# Patient Record
Sex: Female | Born: 1985 | Race: Black or African American | Hispanic: No | Marital: Single
Health system: Midwestern US, Community
[De-identification: ages and names within clinical notes are randomized; demographics above are authoritative.]

## PROBLEM LIST (undated history)

## (undated) DIAGNOSIS — F1011 Alcohol abuse, in remission: Secondary | ICD-10-CM

## (undated) DIAGNOSIS — N289 Disorder of kidney and ureter, unspecified: Secondary | ICD-10-CM

## (undated) DIAGNOSIS — I119 Hypertensive heart disease without heart failure: Secondary | ICD-10-CM

## (undated) DIAGNOSIS — I7100 Dissection of unspecified site of aorta: Secondary | ICD-10-CM

## (undated) DIAGNOSIS — M3119 Other thrombotic microangiopathy: Secondary | ICD-10-CM

## (undated) DIAGNOSIS — Z992 Dependence on renal dialysis: Secondary | ICD-10-CM

## (undated) DIAGNOSIS — I5032 Chronic diastolic (congestive) heart failure: Secondary | ICD-10-CM

## (undated) DIAGNOSIS — I10 Essential (primary) hypertension: Secondary | ICD-10-CM

## (undated) DIAGNOSIS — F1490 Cocaine use, unspecified, uncomplicated: Secondary | ICD-10-CM

## (undated) DIAGNOSIS — M311 Thrombotic microangiopathy: Secondary | ICD-10-CM

## (undated) DIAGNOSIS — N186 End stage renal disease: Secondary | ICD-10-CM

## (undated) DIAGNOSIS — I43 Cardiomyopathy in diseases classified elsewhere: Secondary | ICD-10-CM

## (undated) HISTORY — DX: Chronic diastolic (congestive) heart failure: I50.32

## (undated) HISTORY — PX: REPAIR OF ACUTE ASCENDING THORACIC AORTIC DISSECTION: SHX6323

## (undated) HISTORY — DX: Cardiomyopathy in diseases classified elsewhere: I43

## (undated) HISTORY — DX: Hypertensive heart disease without heart failure: I11.9

## (undated) HISTORY — DX: Cocaine use, unspecified, uncomplicated: F14.90

## (undated) HISTORY — DX: Essential (primary) hypertension: I10

---

## 2015-07-01 ENCOUNTER — Inpatient Hospital Stay: Admit: 2015-07-02 | Payer: Self-pay | Primary: Internal Medicine

## 2015-07-01 DIAGNOSIS — J189 Pneumonia, unspecified organism: Secondary | ICD-10-CM

## 2015-07-01 NOTE — Progress Notes (Signed)
1930. Pt a direct admit, arrived in unit from [facility] via transport in a Doctor, general practice. Pt alert and oriented x4, with IV line gauge #22, Left AC.     Hooked to cardiac monitor. Admission assessment and medrecon completed.

## 2015-07-01 NOTE — Progress Notes (Signed)
Called [facility] to have them send discharge summary of the pt, which was not included in the paperworks that came  With the transfer.

## 2015-07-01 NOTE — Other (Signed)
Bedside and Verbal shift change report given to Harvie Heck RN (oncoming nurse) by Alberteen Spindle (offgoing nurse). Report included the following information SBAR, Kardex, MAR, Recent Results and Cardiac Rhythm NSR, Sinus Tach.

## 2015-07-01 NOTE — Progress Notes (Signed)
[place] called back. Spoke with [employer] and said that they were not able to prepare the discharge summary. Was suggested to call in the morning again and ask the AM nurses in their unit for the paperworks.

## 2015-07-01 NOTE — Progress Notes (Signed)
1945: Informed offgoing and incoming charge nurse about pt's arrival. CNs sent message to Dr. Romeo Apple informing about pt's admission in the unit.     Awaiting for callback.

## 2015-07-01 NOTE — Progress Notes (Signed)
2040: Sent archwireless to Dr. Romeo Apple informing about admission.     2042: Dr. Earlie Lou called back. RN was instructed to let hematology/oncology aware of pt's admission.

## 2015-07-01 NOTE — Progress Notes (Signed)
2105:Spoke with Dr. Lesly Rubenstein, the hematologist/oncologist on-call for Dr.Tan and informed about the transfer of pt in the unit.  Was instructed to have hospitalist see the patient and callback for follow-up if needed.     2110: Relayed the message to Dr. Earlie Lou.     Will wait for orders and continue to monitor the pt.

## 2015-07-02 ENCOUNTER — Inpatient Hospital Stay
Admit: 2015-07-02 | Discharge: 2015-07-04 | Disposition: A | Discharge status: Home / Self Care | Payer: Self-pay | Source: Other Acute Inpatient Hospital | Attending: Internal Medicine | Admitting: Internal Medicine

## 2015-07-02 LAB — CBC WITH AUTOMATED DIFF
BASOPHILS: 0.8 % (ref 0–3)
EOSINOPHILS: 4.4 % (ref 0–5)
HCT: 24.3 % — ABNORMAL LOW (ref 37.0–50.0)
HGB: 8.4 gm/dl — ABNORMAL LOW (ref 13.0–17.2)
IMMATURE GRANULOCYTES: 3.1 % — ABNORMAL HIGH (ref 0.0–3.0)
LYMPHOCYTES: 28.6 % (ref 28–48)
MCH: 30.8 pg (ref 25.4–34.6)
MCHC: 34.6 gm/dl (ref 30.0–36.0)
MCV: 89 fL (ref 80.0–98.0)
MONOCYTES: 8.4 % (ref 1–13)
MPV: 13.4 fL — ABNORMAL HIGH (ref 6.0–10.0)
NEUTROPHILS: 54.7 % (ref 34–64)
NRBC: 0 (ref 0–0)
PLATELET: 127 10*3/uL — ABNORMAL LOW (ref 140–450)
RBC: 2.73 M/uL — ABNORMAL LOW (ref 3.60–5.20)
RDW-SD: 49.1 — ABNORMAL HIGH (ref 36.4–46.3)
WBC: 9.1 10*3/uL (ref 4.0–11.0)

## 2015-07-02 LAB — METABOLIC PANEL, COMPREHENSIVE
ALT (SGPT): 10 U/L — ABNORMAL LOW (ref 12–78)
AST (SGOT): 8 U/L — ABNORMAL LOW (ref 15–37)
Albumin: 2.5 gm/dl — ABNORMAL LOW (ref 3.4–5.0)
Alk. phosphatase: 96 U/L (ref 45–117)
BUN: 33 mg/dl — ABNORMAL HIGH (ref 7–25)
Bilirubin, total: 0.4 mg/dl (ref 0.2–1.0)
CO2: 26 mEq/L (ref 21–32)
Calcium: 8.2 mg/dl — ABNORMAL LOW (ref 8.5–10.1)
Chloride: 98 mEq/L (ref 98–107)
Creatinine: 5.3 mg/dl — ABNORMAL HIGH (ref 0.6–1.3)
GFR est AA: 12
GFR est non-AA: 10
Glucose: 122 mg/dl — ABNORMAL HIGH (ref 74–106)
Potassium: 3.3 mEq/L — ABNORMAL LOW (ref 3.5–5.1)
Protein, total: 5.6 gm/dl — ABNORMAL LOW (ref 6.4–8.2)
Sodium: 133 mEq/L — ABNORMAL LOW (ref 136–145)

## 2015-07-02 LAB — RETICULOCYTE COUNT
Absolute Retic Cnt.: 0.15 M/uL — ABNORMAL HIGH (ref 0.020–0.110)
Immature Retic Fraction: 34 % — ABNORMAL HIGH (ref 0.05–0.25)
Retic Hgb Conc.: 36.2 pg (ref 28.2–36.6)
Reticulocyte count: 5.55 % — ABNORMAL HIGH (ref 0.50–1.50)

## 2015-07-02 LAB — PROTHROMBIN TIME + INR
INR: 1 (ref 0.1–1.1)
Prothrombin time: 13.3 seconds (ref 11.5–14.0)

## 2015-07-02 LAB — INFLUENZA A/B, BY PCR
H1N1 by PCR: NOT DETECTED
Influenza A PCR: NEGATIVE
Influenza B PCR: NEGATIVE

## 2015-07-02 LAB — ANTIBODY SCREEN: Antibody screen: NEGATIVE

## 2015-07-02 LAB — LD: LD: 560 U/L — ABNORMAL HIGH (ref 84–246)

## 2015-07-02 LAB — TROPONIN I
Troponin-I: 0.152 ng/ml — ABNORMAL HIGH (ref 0.000–0.045)
Troponin-I: 0.166 ng/ml — ABNORMAL HIGH (ref 0.000–0.045)

## 2015-07-02 LAB — BLOOD TYPE, (ABO+RH)
ABO/Rh(D): O POS
ABO/Rh: O POS

## 2015-07-02 LAB — HGB & HCT
HCT: 24.1 % — ABNORMAL LOW (ref 37.0–50.0)
HGB: 8.3 gm/dl — ABNORMAL LOW (ref 13.0–17.2)

## 2015-07-02 LAB — PTT: aPTT: 34.3 seconds (ref 24.9–35.6)

## 2015-07-02 LAB — TSH 3RD GENERATION: TSH: 1.95 u[IU]/mL (ref 0.358–3.740)

## 2015-07-02 LAB — S.PNEUMO AG, UR/CSF: Strep pneumo Ag, urine: NEGATIVE

## 2015-07-02 LAB — LEGIONELLA PNEUMOPHILA AG, URINE: Legionella Ag, urine: NEGATIVE

## 2015-07-02 LAB — CALCIUM, IONIZED: CALCIUM,IONIZED: 4.5 mg/dl (ref 4.4–5.4)

## 2015-07-02 MED ORDER — DIPHENHYDRAMINE HCL 50 MG/ML IJ SOLN
50 mg/mL | Freq: Four times a day (QID) | INTRAMUSCULAR | Status: DC | PRN
Start: 2015-07-02 — End: 2015-07-04

## 2015-07-02 MED ORDER — SODIUM CHLORIDE 0.9 % IV
500 mg | INTRAVENOUS | Status: DC
Start: 2015-07-02 — End: 2015-07-04
  Administered 2015-07-02 – 2015-07-04 (×3): via INTRAVENOUS

## 2015-07-02 MED ORDER — ZOLPIDEM 5 MG TAB
5 mg | Freq: Every evening | ORAL | Status: DC | PRN
Start: 2015-07-02 — End: 2015-07-04

## 2015-07-02 MED ORDER — CALCIUM GLUCONATE 100 MG/ML (10%) IV SOLN
100 mg/mL (10%) | INTRAVENOUS | Status: DC | PRN
Start: 2015-07-02 — End: 2015-07-04

## 2015-07-02 MED ORDER — ALBUTEROL SULFATE 2.5 MG/0.5 ML NEB SOLUTION
2.5 mg/0.5 mL | Freq: Four times a day (QID) | RESPIRATORY_TRACT | Status: DC
Start: 2015-07-02 — End: 2015-07-02
  Administered 2015-07-02: 07:00:00 via RESPIRATORY_TRACT

## 2015-07-02 MED ORDER — ACETAMINOPHEN 325 MG TABLET
325 mg | Freq: Four times a day (QID) | ORAL | Status: DC | PRN
Start: 2015-07-02 — End: 2015-07-02

## 2015-07-02 MED ORDER — PANTOPRAZOLE 40 MG TAB, DELAYED RELEASE
40 mg | Freq: Every day | ORAL | Status: DC
Start: 2015-07-02 — End: 2015-07-04
  Administered 2015-07-03 – 2015-07-04 (×2): via ORAL

## 2015-07-02 MED ORDER — ALBUTEROL SULFATE 2.5 MG/0.5 ML NEB SOLUTION
2.5 mg/0.5 mL | Freq: Four times a day (QID) | RESPIRATORY_TRACT | Status: DC | PRN
Start: 2015-07-02 — End: 2015-07-04

## 2015-07-02 MED ORDER — CARVEDILOL 25 MG TAB
25 mg | Freq: Two times a day (BID) | ORAL | Status: DC
Start: 2015-07-02 — End: 2015-07-04
  Administered 2015-07-02 – 2015-07-04 (×5): via ORAL

## 2015-07-02 MED ORDER — SODIUM CHLORIDE 0.9 % IV
INTRAVENOUS | Status: DC
Start: 2015-07-02 — End: 2015-07-03
  Administered 2015-07-02 – 2015-07-03 (×4): via INTRAVENOUS

## 2015-07-02 MED ORDER — PREDNISONE 20 MG TAB
20 mg | Freq: Every day | ORAL | Status: DC
Start: 2015-07-02 — End: 2015-07-03
  Administered 2015-07-03: 13:00:00 via ORAL

## 2015-07-02 MED ORDER — NALOXONE 0.4 MG/ML INJECTION
0.4 mg/mL | INTRAMUSCULAR | Status: DC | PRN
Start: 2015-07-02 — End: 2015-07-04

## 2015-07-02 MED ORDER — SODIUM CHLORIDE 0.9 % IV PIGGY BACK
2 gram | INTRAVENOUS | Status: DC
Start: 2015-07-02 — End: 2015-07-04
  Administered 2015-07-02 – 2015-07-04 (×3): via INTRAVENOUS

## 2015-07-02 MED ORDER — SODIUM CHLORIDE 0.9 % IJ SYRG
Freq: Three times a day (TID) | INTRAMUSCULAR | Status: DC
Start: 2015-07-02 — End: 2015-07-04
  Administered 2015-07-02 – 2015-07-04 (×8): via INTRAVENOUS

## 2015-07-02 MED ORDER — AMLODIPINE 5 MG TAB
5 mg | Freq: Every day | ORAL | Status: DC
Start: 2015-07-02 — End: 2015-07-04
  Administered 2015-07-02 – 2015-07-04 (×4): via ORAL

## 2015-07-02 MED ORDER — DIPHENHYDRAMINE 25 MG CAP
25 mg | Freq: Four times a day (QID) | ORAL | Status: DC | PRN
Start: 2015-07-02 — End: 2015-07-02

## 2015-07-02 MED ORDER — SODIUM CHLORIDE 0.9 % IJ SYRG
INTRAMUSCULAR | Status: DC | PRN
Start: 2015-07-02 — End: 2015-07-04

## 2015-07-02 MED ORDER — ACETAMINOPHEN 325 MG TABLET
325 mg | Freq: Four times a day (QID) | ORAL | Status: DC | PRN
Start: 2015-07-02 — End: 2015-07-04

## 2015-07-02 MED ORDER — SODIUM CHLORIDE 0.9 % IV
INTRAVENOUS | Status: DC | PRN
Start: 2015-07-02 — End: 2015-07-04

## 2015-07-02 MED ORDER — DOCUSATE SODIUM 100 MG CAP
100 mg | Freq: Every day | ORAL | Status: DC | PRN
Start: 2015-07-02 — End: 2015-07-04

## 2015-07-02 MED FILL — CARVEDILOL 25 MG TAB: 25 mg | ORAL | Qty: 1

## 2015-07-02 MED FILL — SODIUM CHLORIDE 0.9 % IV: INTRAVENOUS | Qty: 1000

## 2015-07-02 MED FILL — AZITHROMYCIN 500 MG IV SOLUTION: 500 mg | INTRAVENOUS | Qty: 5

## 2015-07-02 MED FILL — BD POSIFLUSH NORMAL SALINE 0.9 % INJECTION SYRINGE: INTRAMUSCULAR | Qty: 20

## 2015-07-02 MED FILL — AMLODIPINE 5 MG TAB: 5 mg | ORAL | Qty: 2

## 2015-07-02 MED FILL — CALCIUM GLUCONATE 100 MG/ML (10%) IV SOLN: 100 mg/mL (10%) | INTRAVENOUS | Qty: 20

## 2015-07-02 MED FILL — BD POSIFLUSH NORMAL SALINE 0.9 % INJECTION SYRINGE: INTRAMUSCULAR | Qty: 10

## 2015-07-02 MED FILL — SODIUM CHLORIDE 0.9 % IV PIGGY BACK: INTRAVENOUS | Qty: 50

## 2015-07-02 MED FILL — SODIUM CHLORIDE 0.9 % IV: INTRAVENOUS | Qty: 250

## 2015-07-02 NOTE — Other (Signed)
Bedside and Verbal shift change report given to Melissa Belch RN (oncoming nurse) by Sharia Reeve RN (offgoing nurse). Report included the following information SBAR, Intake/Output, MAR, Accordion and Cardiac Rhythm NSR with 4 beats of Non sustain Vtach.

## 2015-07-02 NOTE — Other (Signed)
Please update mom of patient per patient request, she would like to be present before any type of procedure is scheduled and have it explained to them both. [phone], Carolyne Littles.

## 2015-07-02 NOTE — Consults (Signed)
[facility]  CONSULTATION REPORT  NAME:  [name], [name]  SEX:   F  ADMIT: 07/01/2015  DATE OF CONSULT: 07/02/2015  REFERRING PHYSICIAN:    DOB: 1986-04-04  MR#    [id number]  ROOM:  CD10  ACCT#  [id number]    cc: Arby Barrette MD    REFERRING PHYSICIAN:  Dr. Romeo Apple.    PROBLEM LIST:  1.  Likely chronic kidney disease presumably due to chronic uncontrolled   hypertension and cocaine abuse.  2.  Anemia.  3.  Thrombocytopenia.  4.  Hypertension since the birth of her second child 6 years ago.  She has not   been on medications consistently over that time and was out of them for 2   weeks before admission.  5.  Active cocaine abuse.  She used heavily the week leading up to this   admission.  6.  Congestive heart failure with an ejection fraction approximately 48% with   stage II systolic dysfunction on echocardiogram done in [facility]   this week.    HISTORY OF PRESENT ILLNESS:  The patient is a very nice 30 year old female with the above-mentioned medical   problems who presented initially to [facility] with a complaint of   cough.  The patient states that she got up on the evening of admission had   uncontrollable coughing fit and actually expectorated a little bit of blood   into the toilet.  This did not last long, but it was so troubling to her and   her roommate that she presented to [facility].  Once there, she was   found to be hyponatremic, hypokalemic and have a creatinine of 5.    Consideration was for HUS-TTP and the patient was transferred up here to begin   plasmapheresis.  She currently states she feels fine.  Recently, she has had   no headaches.  She had no chest pain, but she was short of breath with cough.    No palpitations.  No nausea, vomiting, diarrhea, constipation, melena or   hematochezia.  No dysuria or hematuria.  Of note, as mentioned, she was using   cocaine heavily the week before she presented and has been out of her blood    pressure medicines for approximately 2 weeks.  She has no other complaints at   this time.    PAST MEDICAL AND SURGICAL HISTORY:  As mentioned.    ALLERGIES:  NO KNOWN DRUG ALLERGIES.    SOCIAL HISTORY:  She smokes about 1/3 of pack a day, drinks 3 to 4 beers about 3 times a week.    Cocaine use is as outlined.  She denies any other drug use.  She is currently   unemployed.  She is gravida 2, para 2-0-0-2.  There is no family history of   chronic kidney disease.    CURRENT MEDICATIONS:  Norvasc 10 mg daily, Zithromax 500 mg IV daily, Coreg 25 mg b.i.d., Rocephin 2   grams IV daily.    REVIEW OF SYSTEMS:  As per HPI, otherwise noncontributory.    PHYSICAL EXAMINATION:  VITAL SIGNS:  She is afebrile, pulse 89, blood pressure 120/68.  GENERAL:  No acute distress.  NECK:  Supple, no lymphadenopathy, 2+ carotid pulses bilaterally.  Trachea in   the midline.  CHEST:  Normal symmetric air movement.  No rales, rhonchi or wheezes   bilaterally.  HEART:  Normal S1 and S2, regular rate and rhythm.  There is an S4, which  clearly audible with an occasional S3.  No murmurs or rubs.  ABDOMEN:  Obese, soft, nontender, nondistended, normoactive bowel sounds, no   masses.  EXTREMITIES:  No clubbing, cyanosis or edema.    LABORATORY DATA:  White count 9, hemoglobin and hematocrit 8 and 24, platelets of 127.  LDH was   560.  ADAMTS13 is pending.  Sodium 133, potassium 3.3, chloride 98,   bicarbonate 26, BUN 33, creatinine 5.3, glucose 122, albumin 2.5.  LFTs   unremarkable.  Troponins are negative.  Renal ultrasound was done at [facility]   as well.  It showed normal size and symmetry; however, increased echotexture   consistent with chronic kidney disease.    ASSESSMENT:  1.  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  In reviewing Care   Everywhere, the patient presented to [facility] in [place]   with similar labs.  She did have a creatinine of 1 back in 2015; however, she    was off of blood pressure medicines for most of that year and using cocaine as   outlined.  Unfortunately, I think she has near ESRD from chronic cocaine use   and hypertension.  Certainly, there is some consideration for HUS-TTP and I   think it is appropriate to treat her until this is ruled out, but I think when   all is said and done, this is going to be most consistent with CKD.  I do not   have any urinary data to guide this; however, and this would be good to find   out.  Serologic workup was supposed to be ordered at [facility] however, this was   not done.  There is obviously no evidence of obstruction.  2.  Anemia.  As above, the patient is due to start plasma exchange per Dr.   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  3.  Thrombocytopenia.  Again, she presented with a platelet count in the 40s   at [place] previously, did not get pheresis and this improved spontaneously   again suggesting uncontrolled hypertension as the nidus of this.  The only   difference between here and there was that she has schistocytes on smear here   which she did not have there.  4.  Hyponatremia, probably due to ADH release in the setting of uncontrolled   hypertension or possibly p.o. intake given her hypokalemia as well.  5.  Hyperkalemia.  This is getting better.  Again, I think she was hypovolemic   on presentation, probably because she was not taking in much due to cocaine   intoxication.    PLAN:  1.  Plasmapheresis will begin per Dr. Sharma Covert recommendations.  2.  Will workup her anemia briefly with iron stores, B12 and folate as this   was not done at [facility].  3.  I will pursue a brief serologic workup for lupus, hepatitis, HIV and   syphilis.  4.  Will collect urinary data.  5.  I have ordered a PTH and vitamin D levels to help confirm that this is    somewhat chronic.  6.  Unfortunately, her renal prognosis is not good.  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    Thank you for allowing [facility] to participate in the   patient's care.  We will follow her closely and make recommendations as they   become necessary.      ___________________  Herma Carson MD  Dictated By:.   SB  D:07/02/2015 12:04:02  T: 07/02/2015 12:53:03  [id number]

## 2015-07-02 NOTE — Consults (Signed)
Renal Consult -- [facility]    See dictation # [id number].    Briefly, this is a 30 y.o. female who presented with AKI.      Consult is for AKI      Brief Assessment:  1 -- AKI.  In looking at Care Everywhere, the patient was admitted to [facility] in [place], [place] with essentially the same presentation.  She had a creatinine in the 5's, anemia and thrombocytopenia as well.  I think that this is actually relatively stable CKD likely due from HTN and cocaine.  2 -- Anemia.  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  HUS/TTP certainly a consideration that is hard to rule out in this case and we do not want to miss the diagnosis.  3 -- Thrombocytopenia.  As above, could be due to HUS/TTP, cocaine, HTN or all three.  4 -- HTN.  Stable here.  She had been out of her medications for two weeks.  5 -- Cocaine abuse.  The patient admitted to me that she was using this heavily the week before she presented.    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 Jerrye Bushy.  2 -- Check PTH, vitamin D -- suspect these will be elevated.  3 -- Check iron stores, B12, folate, etc.  These were not checked at [facility].  4 -- Check serologic workup.  5 -- Renal prognosis at this point is not that good.  She is not far off from needing dialysis.    Thanks!!!    Bobbe Medico, MD, [facility]  [phone] Office  [phone] Pager

## 2015-07-02 NOTE — Progress Notes (Addendum)
Patient admitted on 07/01/2015 from Home with No chief complaint on file.       The patient has been admitted to the hospital 1 times in the past 12 months.    Tentative dc plan:   Home follow up with [facility]-patient has no pcp and self pay. Email sent to [facility]-self pay. Patient lives with mother, independent no needs for home health. CM to continue to monitor.  sw consult entered.     Facility if plan no    Anticipated Discharge Date:   02/17    PCP: Drake Leach      Specialists:     none    Dialysis Unit/ chair time / access:    no    Ph[facility]acy:     [facility]     DME:    none    Home Environment:    Lives at [address]  [address]  [place] [address]    [phone].     Prior to admission open services:     no    Home Health Agency-     no    Personal Care Agency-    no    Extended Emergency Contact Information  Primary Emergency Contact: Butcher,Mortic  Address: [address]           [place], [place] [address] [place]  Mobile Phone: [phone]  Relation: None      Transportation:     Mother will transport home    Therapy Recommendations:  OT :y/n     no  PT :y/n     Pending notes  SLP :y/n    no    RT Home O2 Evaluation :y/n     No current oxygen sat on room air 100%    Wound Care: y/n     no    Change consult (formerly [facility]) :   yes    Case Management Assessment    ABUSE/NEGLECT SCREENING   Physical Abuse/Neglect: Denies   Sexual Abuse: Denies   Sexual Abuse: Denies   Other Abuse/Issues: Denies          PRIMARY DECISION MAKER   Primary Decision Maker Name: Ulyess Blossom   Primary Decision Maker Phone Number: [phone]   Primary Decision Maker Address: [address] [place], [place]   Primary Decision Maker Relationship to Patient: Guardian                   CARE MANAGEMENT INTERVENTIONS   Readmission Interview Completed: Not Applicable   PCP Verified by CM: Yes           Mode of Transport at Discharge: Self        Transition of Care Consult (CM Consult): Discharge Planning                   Physical Therapy Consult: Yes               Reason for Referral: DCP Rounds   History Provided By: Patient   Patient Orientation: Alert and Oriented   Cognition: Alert   Support System Response: Unavailable   Previous Living Arrangement: Lives with Family Independent       Prior Functional Level: Independent in ADLs/IADLs   Current Functional Level: Independent in ADLs/IADLs   Primary Language: English   Can patient return to prior living arrangement: Yes   Ability to make needs known:: Good   Family able to assist with  home care needs:: Yes               Types of Needs Identified: Disease Management Education, Treatment Education, Emotional Support       Confirm Follow Up Transport: Family       Plan discussed with Pt/Family/Caregiver: Yes   Freedom of Choice Offered: Yes      DISCHARGE LOCATION   Discharge Placement: Home

## 2015-07-02 NOTE — Progress Notes (Signed)
Hospitalist Progress Note         [facility]    Daily Progress Note: 07/02/2015    Assessment/Plan:  1. CA Pneumonia  2. HTN  3. AKI/CKD  4. Anemia  5. Thrombocytopenia  6. Ongoing Tob Use  7. Cocaine Abuse  D/W Drs Eugenie Norrie & Lyn Hollingshead- similar presentation Dec 2016 @ [facility]. Holding Plasma Exchange. Will cont to monitor closely       Subjective:   Feels better. No further hemoptysis. No SOB. No C/P.      Respiratory ROS: no cough, shortness of breath,pleutic chest pain or wheezing  Cardiovascular ROS: no chest pain , dyspnea on exertion, orthopnea  Gastrointestinal ROS: no abdominal pain, nausea,vomiting, diarrhea, melena or hematechezia  Genito-Urinary ROS: no dysuria, trouble voiding, or hematuria  Objective:   Physical Exam:     Visit Vitals   ??? BP 129/87   ??? Pulse 87   ??? Temp 98.6 ??F (37 ??C)   ??? Resp 17   ??? Wt 65.4 kg (144 lb 2.9 oz)   ??? SpO2 100%      O2 Device: Room air    Temp (24hrs), Avg:98.3 ??F (36.8 ??C), Min:97.9 ??F (36.6 ??C), Max:98.9 ??F (37.2 ??C)        02/13 1901 - 02/15 0700  In: 1413.3 [P.O.:580; I.V.:833.3]  Out: -     General:  Alert, cooperative, no distress, appears stated age.   Lungs:   Clear to auscultation bilaterally.    :     Heart:  Regular rate and rhythm, S1, S2 normal, no murmur, click, rub or gallop.   Abdomen:   Soft, non-tender. Bowel sounds normal. No masses,  No organomegaly.   Extremities:  FROM, no LE edema   Pulses: 2+ and symmetric all extremities.   Skin: Skin color, texture, turgor normal. No rashes or lesions   :      Data Review:       24 Hour Results:  Recent Results (from the past 24 hour(s))   INFLUENZA A/B, H1N1 BY PCR    Collection Time: 07/02/15 12:20 AM   Result Value Ref Range    Influenza A PCR NEGATIVE NEGATIVE      H1N1 by PCR NOT DETECTED NOT DETECTED,NOT INDICATED      Influenza B PCR NEGATIVE NEGATIVE     HGB & HCT    Collection Time: 07/02/15 12:54 AM   Result Value Ref Range    HGB 8.3 (L) 13.0 - 17.2 gm/dl     HCT 24.1 (L) 37.0 - 50.0 %   TYPE, ABO & RH    Collection Time: 07/02/15 12:54 AM   Result Value Ref Range    ABO/Rh(D) O Rh Positive     ANTIBODY SCREEN    Collection Time: 07/02/15 12:54 AM   Result Value Ref Range    Antibody screen NEG     METABOLIC PANEL, COMPREHENSIVE    Collection Time: 07/02/15 12:54 AM   Result Value Ref Range    Sodium 133 (L) 136 - 145 mEq/L    Potassium 3.3 (L) 3.5 - 5.1 mEq/L    Chloride 98 98 - 107 mEq/L    CO2 26 21 - 32 mEq/L    Glucose 122 (H) 74 - 106 mg/dl    BUN 33 (H) 7 - 25 mg/dl    Creatinine 5.3 (H) 0.6 - 1.3 mg/dl    GFR est AA 12.0      GFR est non-AA 10  Calcium 8.2 (L) 8.5 - 10.1 mg/dl    AST (SGOT) 8 (L) 15 - 37 U/L    ALT (SGPT) 10 (L) 12 - 78 U/L    Alk. phosphatase 96 45 - 117 U/L    Bilirubin, total 0.4 0.2 - 1.0 mg/dl    Protein, total 5.6 (L) 6.4 - 8.2 gm/dl    Albumin 2.5 (L) 3.4 - 5.0 gm/dl   TSH 3RD GENERATION    Collection Time: 07/02/15 12:54 AM   Result Value Ref Range    TSH 1.950 0.358 - 3.740 uIU/mL   TROPONIN I    Collection Time: 07/02/15 12:54 AM   Result Value Ref Range    Troponin-I 0.152 (H) 0.000 - 0.045 ng/ml   S.PNEUMO AG, URINE/CSF ONLY    Collection Time: 07/02/15  2:00 AM   Result Value Ref Range    Strep pneumo Ag, urine NEGATIVE NEGATIVE     LEGIONELLA PNEUMOPHILA AG, URINE     Collection Time: 07/02/15  2:00 AM   Result Value Ref Range    Legionella Ag, Urine NEGATIVE NEGATIVE     LD    Collection Time: 07/02/15  6:18 AM   Result Value Ref Range    LD 560 (H) 84 - 246 U/L   RETICULOCYTE COUNT    Collection Time: 07/02/15  6:18 AM   Result Value Ref Range    Reticulocyte count 5.55 (H) 0.50 - 1.50 %    Absolute Retic Cnt. 0.150 (H) 0.020 - 0.110 M/uL    Immature Retic Fraction 34.00 (H) 0.05 - 0.25 %    Retic Hgb Conc. 36.2 28.2 - 36.6 pg   CBC WITH AUTOMATED DIFF    Collection Time: 07/02/15  6:21 AM   Result Value Ref Range    WBC 9.1 4.0 - 11.0 1000/mm3    RBC 2.73 (L) 3.60 - 5.20 M/uL    HGB 8.4 (L) 13.0 - 17.2 gm/dl     HCT 24.3 (L) 37.0 - 50.0 %    MCV 89.0 80.0 - 98.0 fL    MCH 30.8 25.4 - 34.6 pg    MCHC 34.6 30.0 - 36.0 gm/dl    PLATELET 127 (L) 140 - 450 1000/mm3    MPV 13.4 (H) 6.0 - 10.0 fL    RDW-SD 49.1 (H) 36.4 - 46.3      NRBC 0 0 - 0      IMMATURE GRANULOCYTES 3.1 (H) 0.0 - 3.0 %    NEUTROPHILS 54.7 34 - 64 %    LYMPHOCYTES 28.6 28 - 48 %    MONOCYTES 8.4 1 - 13 %    EOSINOPHILS 4.4 0 - 5 %    BASOPHILS 0.8 0 - 3 %   TROPONIN I    Collection Time: 07/02/15  6:21 AM   Result Value Ref Range    Troponin-I 0.166 (H) 0.000 - 0.045 ng/ml   CALCIUM, IONIZED    Collection Time: 07/02/15 11:49 AM   Result Value Ref Range    CALCIUM,IONIZED 4.5 4.4 - 5.4 mg/dl   PROTHROMBIN TIME + INR    Collection Time: 07/02/15 11:49 AM   Result Value Ref Range    Prothrombin time 13.3 11.5 - 14.0 seconds    INR 1.0 0.1 - 1.1     PTT    Collection Time: 07/02/15 11:49 AM   Result Value Ref Range    aPTT 34.3 24.9 - 35.6 seconds       Problem List:  Problem  List as of 07/02/2015  Never Reviewed          Codes Class Noted - Resolved    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 albuterol CONCENTRATE 2.77m/0.5 mL neb soln  2.5 mg Nebulization Q6H PRN   ??? [START ON 07/03/2015] predniSONE (DELTASONE) tablet 60 mg  60 mg Oral DAILY WITH BREAKFAST   ??? [START ON 07/03/2015] pantoprazole (PROTONIX) tablet 40 mg  40 mg Oral ACB   ??? 0.9% sodium chloride infusion 250 mL  250 mL IntraVENous PRN   ??? acetaminophen (TYLENOL) tablet 650 mg  650 mg Oral Q6H PRN   ??? calcium gluconate 2 g in 0.9% sodium chloride 100 mL IVPB  2 g IntraVENous PRN   ??? diphenhydrAMINE (BENADRYL) injection 50 mg  50 mg IntraVENous Q6H PRN   ??? amLODIPine (NORVASC) tablet 10 mg  10 mg Oral DAILY   ??? carvedilol (COREG) tablet 25 mg  25 mg Oral BID WITH MEALS    ??? sodium chloride (NS) flush 5-10 mL  5-10 mL IntraVENous Q8H   ??? sodium chloride (NS) flush 5-10 mL  5-10 mL IntraVENous PRN   ??? naloxone (NARCAN) injection 0.1 mg  0.1 mg IntraVENous PRN   ??? docusate sodium (COLACE) capsule 100 mg  100 mg Oral DAILY PRN   ??? zolpidem (AMBIEN) tablet 5 mg  5 mg Oral QHS PRN   ??? cefTRIAXone (ROCEPHIN) 2 g in 0.9% sodium chloride (MBP/ADV) 50 mL MBP  2 g IntraVENous Q24H   ??? azithromycin (ZITHROMAX) 500 mg in 0.9% sodium chloride 250 mL IVPB  500 mg IntraVENous Q24H   ??? 0.9% sodium chloride infusion  125 mL/hr IntraVENous CONTINUOUS        Care Plan discussed with: pt, Drs ADerrick Ravel& Rakowski    Total time spent with patient: 20 minutes.    BCarlean Jews MD  July 02, 2015  1:53 PM

## 2015-07-02 NOTE — Progress Notes (Addendum)
Phone: [phone]     Hematology / Oncology Progress Note  [facility]      Patient: [name] [name]   MRN: [id number]         CSN: [id number]    Date of Birth: Sep 02, 1985      Admit Date: 07/01/2015    Assessment:     1- ? TMA (TTP/HUS)- RF, anemia, thrombocytopenia, elevated LDH, smear showed 3-5 schistocytes phf  2- Acute on Chronic kidney injury- pt first heard of Abn Cr in jan 2017 in [place]  3- Anemia- hemolysis   4- Thrombocytopenia  5- Cocaine/alcohol use  6- HTN since age 3    Plan:   - pt will have pheresis catheter put in today for plasma exchange by vascular  - consult nephrology  - daily hemolysis parameters   - start prednisone 50m/kg PO daily as per fully alert , also protonix 40 mg daily  - [facility]   - [id number]  [facility]   - Addendum: [facility] and it seems like she had the same picture and nephrology there 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 Her blood smear today looks better (rare schistocytes) , LDH, and PLT better. SO discussed with Dr. RLyn Hollingsheadand Dr OCranston Neighborand I decided to hold plasma exchange. Vascular and [facility] were informed of the decision.     CLaurita Quint MD  [phone]     Subjective:   Pt feels tired , mild cough , no new complains .     Objective:     Visit Vitals   ??? BP (!) 137/92   ??? Pulse 95   ??? Temp 98.9 ??F (37.2 ??C)   ??? Resp 25   ??? Wt 65.4 kg (144 lb 2.9 oz)   ??? SpO2 100%             Temp (24hrs), Avg:98.2 ??F (36.8 ??C), Min:97.9 ??F (36.6 ??C), Max:98.9 ??F (37.2 ??C)        Intake/Output Summary (Last 24 hours) at 07/02/15 0931  Last data filed at 07/02/15 0459   Gross per 24 hour   Intake          1413.33 ml   Output                0 ml   Net          1413.33 ml       Review of Systems   Constitutional:   Fever: Negative, Chills: Negative, Weight Loss: Negative, Malaise/Fatigue: +   Diaphoresis: Negative  Skin:   Rash: Negative,  Itching: Negative  HENT:   Headache:Negative, Hearing Loss: Negative, Tinnitus: Negative, Ear Pain: Negative   Nosebleeds: Negative, Stridor: Negative, Sore Throat: Negative  Eyes:   Jaudice: negative Blurred Vision: Negative, Double Vision: Negative, Photophobia: Negative   Eye Discharge: Negative, Eye Redness: Negative  Cardiovascular:    Chest Pain: Negative, Palpitations: Negative, Orthopnea: Negative    Leg Swelling: Negative PND: Negative  Respiratory:   Cough: +, Hemoptysis: Negative, Sputum Production: Negative   Shortness of Breath: Negative, Wheezing: Negative  Gastrointestinal:   Heartburn: Negative, Nausea: Negative, Vomiting: Negative   Abdominal Pain: Negative, Diarrhea: Negative, Constipation: Negative   Blood in Stool:  Negative, Melena: Negative   Genitourinary:    Dysuria: Negative, Urgency: Negative, Frequency: Negative   Hematuria: Negative, Flank Pain: Negative  Musculoskeletal:    Myalgias: Negative, Neck Pain: Negative, Back Pain: Negative   Joint Pain: Negative, Falls: Negative  Endo/Heme:    Easy Bruise/Blood: Negative, Polydipsia: Negative, sweats: negative  Neurological:    Dizziness: Negative, Tingling: Negative. Tremor: Negative,    Sensory Change: Negative. Speech Change: Negative, Focal Weakness: Negative     Seizures: Negative, LOC: Negative  Psychiatric:   Depression: Negative, Suicidal Ideas: Negative,  Hallucinations: Negative, Anxious: Negative, Insomnia: Negative, Memory Loss: Negative     Physical Exam:    Constitutional: well developed, well nourished, no acute distress and alert and oriented x 3   HENT: Oral mucosa  moist, no cyanosis observed. +  pallor observed.   Eyes: conjunctiva normal, upper and lower eyelid normal bilaterally.   Neck: No jugular venous distension present.    Cardiovascular: heart sounds normal, normal rate and regular rhythm, no murmurs or rubs,  Carotid arteries normal.     Pulmonary/Chest Wall: breath sounds are clear bilaterally and no use of accessory muscles in breathing.   Abdominal: Non tender, no palpable masses, no hepatosplenomegaly    Genitourinary/Anorectal: No abnormality   Musculoskeletal: No digital clubbing or cyanosis. Normal muscle strength and tone.   Skin: Normal and no rashes or lesions   Lymph nodes: Not enlarged   Peripheral Vascular: No edema present in extremities.         Labs:  Recent Results (from the past 24 hour(s))   INFLUENZA A/B, H1N1 BY PCR    Collection Time: 07/02/15 12:20 AM   Result Value Ref Range    Influenza A PCR NEGATIVE NEGATIVE      H1N1 by PCR NOT DETECTED NOT DETECTED,NOT INDICATED      Influenza B PCR NEGATIVE NEGATIVE     HGB & HCT    Collection Time: 07/02/15 12:54 AM   Result Value Ref Range    HGB 8.3 (L) 13.0 - 17.2 gm/dl    HCT 24.1 (L) 37.0 - 50.0 %   TYPE, ABO & RH    Collection Time: 07/02/15 12:54 AM   Result Value Ref Range    ABO/Rh(D) O Rh Positive     ANTIBODY SCREEN    Collection Time: 07/02/15 12:54 AM   Result Value Ref Range    Antibody screen NEG     METABOLIC PANEL, COMPREHENSIVE    Collection Time: 07/02/15 12:54 AM   Result Value Ref Range    Sodium 133 (L) 136 - 145 mEq/L    Potassium 3.3 (L) 3.5 - 5.1 mEq/L    Chloride 98 98 - 107 mEq/L    CO2 26 21 - 32 mEq/L    Glucose 122 (H) 74 - 106 mg/dl    BUN 33 (H) 7 - 25 mg/dl    Creatinine 5.3 (H) 0.6 - 1.3 mg/dl    GFR est AA 12.0      GFR est non-AA 10      Calcium 8.2 (L) 8.5 - 10.1 mg/dl    AST (SGOT) 8 (L) 15 - 37 U/L    ALT (SGPT) 10 (L) 12 - 78 U/L    Alk. phosphatase 96 45 - 117 U/L    Bilirubin, total 0.4 0.2 - 1.0 mg/dl    Protein, total 5.6 (L) 6.4 - 8.2 gm/dl    Albumin 2.5 (L) 3.4 - 5.0 gm/dl   TSH 3RD GENERATION  Collection Time: 07/02/15 12:54 AM   Result Value Ref Range    TSH 1.950 0.358 - 3.740 uIU/mL   TROPONIN I     Collection Time: 07/02/15 12:54 AM   Result Value Ref Range    Troponin-I 0.152 (H) 0.000 - 0.045 ng/ml   S.PNEUMO AG, URINE/CSF ONLY    Collection Time: 07/02/15  2:00 AM   Result Value Ref Range    Strep pneumo Ag, urine NEGATIVE NEGATIVE     LEGIONELLA PNEUMOPHILA AG, URINE     Collection Time: 07/02/15  2:00 AM   Result Value Ref Range    Legionella Ag, Urine NEGATIVE NEGATIVE     LD    Collection Time: 07/02/15  6:18 AM   Result Value Ref Range    LD 560 (H) 84 - 246 U/L   RETICULOCYTE COUNT    Collection Time: 07/02/15  6:18 AM   Result Value Ref Range    Reticulocyte count 5.55 (H) 0.50 - 1.50 %    Absolute Retic Cnt. 0.150 (H) 0.020 - 0.110 M/uL    Immature Retic Fraction 34.00 (H) 0.05 - 0.25 %    Retic Hgb Conc. 36.2 28.2 - 36.6 pg   CBC WITH AUTOMATED DIFF    Collection Time: 07/02/15  6:21 AM   Result Value Ref Range    WBC 9.1 4.0 - 11.0 1000/mm3    RBC 2.73 (L) 3.60 - 5.20 M/uL    HGB 8.4 (L) 13.0 - 17.2 gm/dl    HCT 24.3 (L) 37.0 - 50.0 %    MCV 89.0 80.0 - 98.0 fL    MCH 30.8 25.4 - 34.6 pg    MCHC 34.6 30.0 - 36.0 gm/dl    PLATELET 127 (L) 140 - 450 1000/mm3    MPV 13.4 (H) 6.0 - 10.0 fL    RDW-SD 49.1 (H) 36.4 - 46.3      NRBC 0 0 - 0      IMMATURE GRANULOCYTES 3.1 (H) 0.0 - 3.0 %    NEUTROPHILS 54.7 34 - 64 %    LYMPHOCYTES 28.6 28 - 48 %    MONOCYTES 8.4 1 - 13 %    EOSINOPHILS 4.4 0 - 5 %    BASOPHILS 0.8 0 - 3 %   TROPONIN I    Collection Time: 07/02/15  6:21 AM   Result Value Ref Range    Troponin-I 0.166 (H) 0.000 - 0.045 ng/ml

## 2015-07-02 NOTE — H&P (Signed)
[facility]  History and Physical  NAME:  [name] [name], [name] [name]  SEX:   F  ADMIT: 07/01/2015  DOB:1985/06/03  MR#    [id number]  ROOM:  CD10  ACCT#  [id number]    I hereby certify this patient for admission based upon medical necessity as   noted below:    <    DATE OF SERVICE:  07/01/2015.    CHIEF COMPLAINT:  The patient was transferred from [facility] for further management.    HISTORY OF PRESENT ILLNESS:  This is a 30 year old female with past medical history significant for   hypertension who initially presented to [facility] on 06/30/2015 with   a chief complaint of shortness of breath, cough, headache, blurry vision,   hemoptysis.  The patient underwent a chest x-ray which showed bilateral patchy   infiltrates and was diagnosed with a community-acquired pneumonia and started   on ceftriaxone, azithromycin.  An echocardiogram was also checked at that   time which showed 45% to 50% ejection fraction and a ground-glass appearance   of myocardium suggestive of amyloidosis.  Also, grade 2 diastolic dysfunction,   mild global hypokinesis of left ventricle on echocardiogram 07/01/2015.  She   has also been diagnosed with renal insufficiency about 2 months ago.    According to outside hospital's notes, she has acute-on-chronic kidney   disease, creatinine on presentation was 4.6.  Her labs here have been ordered.    The patient also noted to have thrombocytopenia has had thrombocytopenia in   the past, and has also been sick with most likely viral infection over the   past 1 week, resulting in a runny nose, sneezing, cough which has been   productive and with blood-tinged sputum.  She has not noted any blood in her   sputum since passed night.      Hemoglobin and hematocrit being trended.  Type and screen performed,    Hematology/Oncology was also consulted and the patient transferred to   [facility] for further evaluation for possibility of    TTP and possible plasmapheresis.  Hematology/Oncology informed of the   patient's arrival.    REVIEW OF SYSTEMS:  A 12-point review of systems was negative except as noted in HPI.    PAST MEDICAL HISTORY:  Hypertension.    PAST SURGICAL HISTORY:  Reviewed and noncontributory to the case.    ALLERGIES:  NO KNOWN DRUG ALLERGIES.    HOME MEDICATIONS:  Norvasc 10 mg daily, metoprolol 25 mg twice daily.    FAMILY HISTORY:  Reviewed and noncontributory to the case.    SOCIAL HISTORY:  The patient continues to use tobacco products.  She also uses cocaine, last   use was 1 week ago.  Occasional alcohol use.    PHYSICAL EXAMINATION:  VITAL SIGNS:  Blood pressure 132/90, pulse 86, oxygen saturation 100% on room   air, respiratory rate 16, temperature 98.2.  GENERAL APPEARANCE:  In no acute distress, resting comfortably, appears stated   age.  HEENT:  Normocephalic, atraumatic.  Extraocular muscles intact.  Pupils equal,   round to light and accommodation.  Severely dry mucous membranes.  NECK:  Supple, no JVD, no lymphadenopathy, no thyromegaly.  LUNGS:  Mild scattered rhonchi.  No rales, no wheezing.  Good air entry   throughout.  CARDIOVASCULAR:  S1 and S2 within normal limits.  No murmurs, rubs or gallops.  ABDOMEN:  Positive bowel sounds, soft, nontender, nondistended.  No   hepatosplenomegaly.  No guarding  or rebound tenderness.  EXTREMITIES:  No clubbing, cyanosis or edema.  Dorsalis pedis pulses 2+   bilaterally.  SKIN:  No rashes, no wounds.  NEUROLOGIC:  Cranial nerves II-XII intact.  Alert and oriented times 3,   nonfocal.    EKG showed sinus tachycardia at 121 beats minute, no ST segment elevation, no   T-wave inversion, QTc at 508 milliseconds.  MRI of the head showed increased   T2 signal.  Official read in the chart, performed at [facility].  V/Q   scan was low probability for pulmonary emboli.  Abdominal ultrasound showed   echogenic kidneys, otherwise normal study, no hydronephrosis.  Echocardiogram    as per HPI.    ASSESSMENT AND PLAN:  1.  Hemoptysis, INR normal, low platelet count; however, no further   blood-tinged sputum since last night, will trend hemoglobin and hematocrit,   type and screen.  Hematology/oncology consult initiated given possibility of   thrombotic thrombocytopenic purpura.  2.  Shortness of breath, most likely due to community-acquired pneumonia,   possibly viral induced.  Will check influenza PCR.  Chest x-ray showed   bilateral patchy infiltrates, no recent hospital admissions, no recent   antibiotic use, started on ceftriaxone and azithromycin, which have been   continued here.  Her ejection fraction is 45% to 50% and showed gross ground   glass appearance of the myocardium suggestive of amyloidosis.  Grade 2   diastolic dysfunction and mild global hypokinesis of the left ventricle on   echocardiogram done on 07/01/2015.  Consider cardiology followup on an   outpatient basis; however, at this time, the patient appears dehydrated.  We   will initiate IV fluids, saturating 100% on room air.  3.  Acute-on-chronic kidney disease per [facility] records.  The   patient's creatinine on presentation was 4.6.  No baseline creatinine   available, abdominal ultrasound shows echogenic kidneys, no hydronephrosis,   nephrology consultation.  The patient is severely dehydrated at this time.  We   will continue hydration, avoid nephrotoxic medications, adjust medications to   creatinine clearance.  4.  Thrombocytopenia.  Initial workup obtained on 07/01/2015 at [facility].  Follow results.  Some of the studies were sent out labs initiated   by hematology/oncology in the form of patient's arrival to [facility].  No active bleeding.  Repeat platelet count, most likely   viral induced, she has had thrombocytopenia in the past as well.  5.  Hypertension.  This is controlled.  Continue metoprolol 25 mg b.i.d.,   Norvasc 10 mg daily.   6.  Hyponatremia, initial sodium of 123 on 06/30/2015.  We will repeat sodium   level.  The patient is dehydrated.  Continue IV fluids.  7.  Elevated D-dimer with negative V/Q scan, chest pain resolved, bilateral   lower extremity duplex negative as well  8.  Chest pain initially on presentation to [facility], at that time   had mildly elevated cardiac enzymes, will continue to trend cardiac enzymes,   telemonitoring, chest pain free now.  EKG:  No acute ischemic changes,   associated coughing.  9.  Hypokalemia.  Replacement initiated at [facility].  Repeat   potassium level.  10.  Headache and blurry vision, resolved.  MRI of medulla showed increased T2   signal.  Neurology consultation/followup.  11.  Ongoing tobacco use.  Advised to quit.  Ongoing alcohol use.  Advised to   quit.  12.  Illicit drug use, cocaine, last use was 1 week ago.  Advised to quit.  13.  Diet is cardiac-renal.  14.  Deep venous thrombosis prophylaxis with sequential compression devices,   otherwise ambulatory.    DISPOSITION:  Home in 2 to 4 days depending on response to treatment.    CODE STATUS:  FULL CODE.    Case discussed with Dr. Romeo Apple, hematology/oncology informed, patient,   floor nursing staff.    CRITICAL CARE TIME:    48 minutes.      ___________________  Irving Burton MD  Dictated By: .   [facility]  D:07/01/2015 23:50:12  T: 07/02/2015 01:28:57  [id number]

## 2015-07-02 NOTE — Progress Notes (Signed)
Problem: Falls - Risk of  Goal: *Absence of falls  Outcome: Progressing Towards Goal  Fall precautions instituted. Instructed to call for assistance. Bed alarm on. Pt free from injury through the shift. Will continue to monitor.

## 2015-07-02 NOTE — Other (Signed)
Dr. Claudie Revering paged to notify of 4 beats of non sustain vtach at 0710. Patient is NSR currently.

## 2015-07-02 NOTE — Other (Signed)
Text paged Romeo Apple to clair fy [facility] order 1 UNIT vs. 10 UNITS that the lab had in their orders, for possible plasmapheresis.

## 2015-07-03 LAB — PTH INTACT: PTH, Intact: 340.3 pg/ml — ABNORMAL HIGH (ref 14.0–72.0)

## 2015-07-03 LAB — RETICULOCYTE COUNT
Absolute Retic Cnt.: 0.159 M/uL — ABNORMAL HIGH (ref 0.020–0.110)
Immature Retic Fraction: 33 % — ABNORMAL HIGH (ref 0.05–0.25)
Retic Hgb Conc.: 35.8 pg (ref 28.2–36.6)
Reticulocyte count: 6.12 % — ABNORMAL HIGH (ref 0.50–1.50)

## 2015-07-03 LAB — RENAL FUNCTION PANEL
Albumin: 2.5 gm/dl — ABNORMAL LOW (ref 3.4–5.0)
BUN: 27 mg/dl — ABNORMAL HIGH (ref 7–25)
CO2: 22 mEq/L (ref 21–32)
Calcium: 7.8 mg/dl — ABNORMAL LOW (ref 8.5–10.1)
Chloride: 107 mEq/L (ref 98–107)
Creatinine: 5 mg/dl — ABNORMAL HIGH (ref 0.6–1.3)
GFR est AA: 13
GFR est non-AA: 11
Glucose: 94 mg/dl (ref 74–106)
Phosphorus: 3 mg/dl (ref 2.5–4.9)
Potassium: 3.3 mEq/L — ABNORMAL LOW (ref 3.5–5.1)
Sodium: 140 mEq/L (ref 136–145)

## 2015-07-03 LAB — LD: LD: 439 U/L — ABNORMAL HIGH (ref 84–246)

## 2015-07-03 LAB — CBC WITH AUTOMATED DIFF
BASOPHILS: 0.5 % (ref 0–3)
EOSINOPHILS: 6.1 % — ABNORMAL HIGH (ref 0–5)
HCT: 23.5 % — ABNORMAL LOW (ref 37.0–50.0)
HGB: 7.9 gm/dl — ABNORMAL LOW (ref 13.0–17.2)
IMMATURE GRANULOCYTES: 3 % (ref 0.0–3.0)
LYMPHOCYTES: 29.1 % (ref 28–48)
MCH: 30.4 pg (ref 25.4–34.6)
MCHC: 33.6 gm/dl (ref 30.0–36.0)
MCV: 90.4 fL (ref 80.0–98.0)
MONOCYTES: 6.8 % (ref 1–13)
MPV: 12.5 fL — ABNORMAL HIGH (ref 6.0–10.0)
NEUTROPHILS: 54.5 % (ref 34–64)
NRBC: 0 (ref 0–0)
PLATELET: 151 10*3/uL (ref 140–450)
RBC: 2.6 M/uL — ABNORMAL LOW (ref 3.60–5.20)
RDW-SD: 51.6 — ABNORMAL HIGH (ref 36.4–46.3)
WBC: 9.1 10*3/uL (ref 4.0–11.0)

## 2015-07-03 LAB — FERRITIN: Ferritin: 451.2 ng/ml — ABNORMAL HIGH (ref 8.0–252.0)

## 2015-07-03 LAB — VITAMIN D, 25 HYDROXY: Vitamin D, 25-OH, Total: 11.7 ng/ml — ABNORMAL LOW (ref 30.0–100.0)

## 2015-07-03 LAB — IRON PROFILE
Iron % saturation: 19 % — ABNORMAL LOW (ref 20–45)
Iron: 55 ug/dL (ref 50–170)
TIBC: 290 ug/dL (ref 250–450)

## 2015-07-03 LAB — VITAMIN B12: Vitamin B12: 287 pg/ml (ref 193–986)

## 2015-07-03 LAB — FOLATE: Folate: 10.9 ng/ml (ref 3.1–17.5)

## 2015-07-03 LAB — ADAMTS13 ACTIVITY REFLEX PANEL: ADAMTS13 Activity: 95 % Activity (ref 68–163)

## 2015-07-03 LAB — HAPTOGLOBIN: Haptoglobin: 16 mg/dL — ABNORMAL LOW (ref 43–212)

## 2015-07-03 MED ORDER — EPOETIN ALFA 20,000 UNIT/ML IJ SOLN
20000 unit/mL | INTRAMUSCULAR | Status: DC
Start: 2015-07-03 — End: 2015-07-04
  Administered 2015-07-03: 18:00:00 via SUBCUTANEOUS

## 2015-07-03 MED FILL — CARVEDILOL 25 MG TAB: 25 mg | ORAL | Qty: 1

## 2015-07-03 MED FILL — PANTOPRAZOLE 40 MG TAB, DELAYED RELEASE: 40 mg | ORAL | Qty: 1

## 2015-07-03 MED FILL — PROCRIT 20,000 UNIT/ML INJECTION SOLUTION: 20000 unit/mL | INTRAMUSCULAR | Qty: 0.25

## 2015-07-03 MED FILL — AMLODIPINE 5 MG TAB: 5 mg | ORAL | Qty: 2

## 2015-07-03 MED FILL — SODIUM CHLORIDE 0.9 % IV PIGGY BACK: INTRAVENOUS | Qty: 50

## 2015-07-03 MED FILL — PREDNISONE 20 MG TAB: 20 mg | ORAL | Qty: 3

## 2015-07-03 MED FILL — AZITHROMYCIN 500 MG IV SOLUTION: 500 mg | INTRAVENOUS | Qty: 5

## 2015-07-03 NOTE — Progress Notes (Signed)
Bedside verbal shift report given to Joon, RN(on coming nurse) by Leanord Asal  nurse). Report included SBAR,MAR, KARDEX and clinical updates.

## 2015-07-03 NOTE — Progress Notes (Signed)
CM received contact from CM following patient with [facility] and spoke with Granville Lewis [phone] she informed that patient does not have a pcp and gave the information for pcp patient will be following up with Drake Leach, md with [facility] address [address] [place] [phone] ph# [phone]

## 2015-07-03 NOTE — Progress Notes (Signed)
Hematology / Oncology Progress Note  [facility]        Patient: [name] [name]  Gender: female   MRN: [id number]    Date of Birth: 07-23-1985 Age: 30 y.o.   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 Will benefit from Epo.  Plt- better. No TTP.  No indication for pheresis.  DW pt.  Made aware she needs to stop cocaine and take care of HTN.  Stable for DC from my standpoint.      Subjective:     Feels ok.    Objective:     Visit Vitals   ??? BP 129/75   ??? Pulse 92   ??? Temp 98.7 ??F (37.1 ??C)   ??? Resp 19   ??? Wt 65.4 kg (144 lb 2.9 oz)   ??? SpO2 100%             Temp (24hrs), Avg:98.6 ??F (37 ??C), Min:97.8 ??F (36.6 ??C), Max:99.5 ??F (37.5 ??C)        Intake/Output Summary (Last 24 hours) at 07/03/15 0945  Last data filed at 07/03/15 8295   Gross per 24 hour   Intake          3218.75 ml   Output                1 ml   Net          3217.75 ml       Review of Systems:   Constitutional: negative for fevers, chills, sweats and fatigue  Eyes: negative for visual disturbance, redness and icterus  Ears, Nose, Mouth, Throat, and Face: negative for tinnitus, epistaxis, sore mouth and hoarseness  Respiratory: negative for cough, sputum, hemoptysis, pleurisy/chest pain or wheezing  Cardiovascular: negative for chest pain, chest pressure/discomfort, palpitations, irregular heart beats, syncope, paroxysmal nocturnal dyspnea  Gastrointestinal: negative for reflux symptoms, nausea, vomiting, change in bowel habits, melena, diarrhea, constipation and abdominal pain  Genitourinary:negative for dysuria, nocturia, urinary incontinence, hesitancy and hematuria  Integument: negative for rash, skin lesion(s) and pruritus  Hematologic/Lymphatic: negative for easy bruising, bleeding and lymphadenopathy  Musculoskeletal:negative for myalgias, arthralgias and bone pain   Neurological: negative for headaches, dizziness, seizures, paresthesia and weakness    Physical Exam:  Constitutional: Alert, oriented, not in distress  Eyes: PERRLA, anicteric, no redness  Ears, nose, mouth, throat, and face: no palpable Lymph nodes, no mucositis, no thrush.  Respiratory: symmetrical expansion, no rales, no rhonchi, no wheezing.  Cardiovascular: S1S2, no pathologic murmur, no rub.  Gastrointestinal: soft, benign, non tender, no HSM, normal bowel sounds, no mass.  Integument: no rash, no petechiae, no ecchymosis.  Musculoskeletal: no edema, no cyanosis, no clubbing.  Neurological: intact, cranial nerves, no focal motor or sensory deficits.    Labs:  Basic Metabolic Profile   Recent Labs      07/03/15   0230  07/02/15   0054   NA  140  133*   K  3.3*  3.3*   CL  107  98   CO2  22  26   BUN  27*  33*   CREA  5.0*  5.3*   GLU  94  122*   CA  7.8*  8.2*   PHOS  3.0   --  CBC w/Diff    Recent Labs      07/03/15   0230  07/02/15   0621  07/02/15   0054   WBC  9.1  9.1   --    HGB  7.9*  8.4*  8.3*   HCT  23.5*  24.3*  24.1*   PLT  151  127*   --     Recent Labs      07/03/15   0230  07/02/15   0621   GRANS  54.5  54.7        CHEMISTRY   Lab Results   Component Value Date    ALB 2.5 (L) 07/03/2015    TP 5.6 (L) 07/02/2015    TBILI 0.4 07/02/2015    ALT 10 (L) 07/02/2015    SGOT 8 (L) 07/02/2015    AP 96 07/02/2015         Coagulation   Lab Results   Component Value Date/Time    Prothrombin time 13.3 07/02/2015 11:49 AM    INR 1.0 07/02/2015 11:49 AM    aPTT 34.3 07/02/2015 11:49 AM          Current Medications:   Current Facility-Administered Medications:   ???  albuterol CONCENTRATE 2.5mg /0.5 mL neb soln, 2.5 mg, Nebulization, Q6H PRN, Dorothe Pea, MD  ???  predniSONE (DELTASONE) tablet 60 mg, 60 mg, Oral, DAILY WITH BREAKFAST, Nadara Eaton, MD, 60 mg at 07/03/15 1610  ???  pantoprazole (PROTONIX) tablet 40 mg, 40 mg, Oral, ACB, Nadara Eaton, MD, 40 mg at 07/03/15 0746   ???  0.9% sodium chloride infusion 250 mL, 250 mL, IntraVENous, PRN, Nadara Eaton, MD  ???  acetaminophen (TYLENOL) tablet 650 mg, 650 mg, Oral, Q6H PRN, Nadara Eaton, MD  ???  calcium gluconate 2 g in 0.9% sodium chloride 100 mL IVPB, 2 g, IntraVENous, PRN, Nadara Eaton, MD  ???  diphenhydrAMINE (BENADRYL) injection 50 mg, 50 mg, IntraVENous, Q6H PRN, Nadara Eaton, MD  ???  amLODIPine (NORVASC) tablet 10 mg, 10 mg, Oral, DAILY, Irving Burton, MD, 10 mg at 07/03/15 0910  ???  carvedilol (COREG) tablet 25 mg, 25 mg, Oral, BID WITH MEALS, Muhamed Jasarevic, MD, 25 mg at 07/03/15 0814  ???  sodium chloride (NS) flush 5-10 mL, 5-10 mL, IntraVENous, Q8H, Muhamed Jasarevic, MD, Stopped at 07/03/15 0600  ???  sodium chloride (NS) flush 5-10 mL, 5-10 mL, IntraVENous, PRN, Irving Burton, MD  ???  naloxone (NARCAN) injection 0.1 mg, 0.1 mg, IntraVENous, PRN, Irving Burton, MD  ???  docusate sodium (COLACE) capsule 100 mg, 100 mg, Oral, DAILY PRN, Irving Burton, MD  ???  zolpidem (AMBIEN) tablet 5 mg, 5 mg, Oral, QHS PRN, Irving Burton, MD  ???  cefTRIAXone (ROCEPHIN) 2 g in 0.9% sodium chloride (MBP/ADV) 50 mL MBP, 2 g, IntraVENous, Q24H, Muhamed Jasarevic, MD, Last Rate: 100 mL/hr at 07/02/15 2328, 2 g at 07/02/15 2328  ???  azithromycin (ZITHROMAX) 500 mg in 0.9% sodium chloride 250 mL IVPB, 500 mg, IntraVENous, Q24H, Muhamed Jasarevic, MD, Last Rate: 250 mL/hr at 07/02/15 2338, 500 mg at 07/02/15 2338  ???  0.9% sodium chloride infusion, 125 mL/hr, IntraVENous, CONTINUOUS, Irving Burton, MD, Last Rate: 125 mL/hr at 07/03/15 0751, 125 mL/hr at 07/03/15 0751    Catalina Antigua, MD   [facility]  Office [phone]

## 2015-07-03 NOTE — Progress Notes (Signed)
Hospitalist Progress Note         [facility]    Daily Progress Note: 07/03/2015    Assessment/Plan:  1. CA Pneumonia  2. HTN  3. AKI/CKD  4. Anemia  5. Thrombocytopenia  6. Ongoing Tob Use  7. Cocaine Abuse  Appears stable. Plan for D/C tom if labs ok. Stressed to her she needs F/U w opthamologist as outpt       Subjective:   Feels better. No further hemoptysis. No SOB. No C/P. No HA. Does note blurred vision.      Respiratory ROS: no cough, shortness of breath,pleutic chest pain or wheezing  Cardiovascular ROS: no chest pain , dyspnea on exertion, orthopnea  Gastrointestinal ROS: no abdominal pain, nausea,vomiting, diarrhea, melena or hematechezia  Genito-Urinary ROS: no dysuria, trouble voiding, or hematuria  Objective:   Physical Exam:     Visit Vitals   ??? BP 116/78   ??? Pulse 90   ??? Temp 98.6 ??F (37 ??C)   ??? Resp 18   ??? Wt 65.4 kg (144 lb 2.9 oz)   ??? SpO2 100%      O2 Device: Room air    Temp (24hrs), Avg:98.9 ??F (37.2 ??C), Min:98.6 ??F (37 ??C), Max:99.5 ??F (37.5 ??C)        02/14 1901 - 02/16 0700  In: 4632.1 [P.O.:580; I.V.:4052.1]  Out: 1 [Urine:1]    General:  Alert, cooperative, no distress, appears stated age.   Lungs:   Clear to auscultation bilaterally.    :     Heart:  Regular rate and rhythm, S1, S2 normal, no murmur, click, rub or gallop.   Abdomen:   Soft, non-tender. Bowel sounds normal. No masses,  No organomegaly.   Extremities:  FROM, no LE edema   Pulses: 2+ and symmetric all extremities.   Skin: Skin color, texture, turgor normal. No rashes or lesions   :      Data Review:       24 Hour Results:  Recent Results (from the past 24 hour(s))   LD    Collection Time: 07/03/15  2:30 AM   Result Value Ref Range    LD 439 (H) 84 - 246 U/L   RETICULOCYTE COUNT    Collection Time: 07/03/15  2:30 AM   Result Value Ref Range    Reticulocyte count 6.12 (H) 0.50 - 1.50 %    Absolute Retic Cnt. 0.159 (H) 0.020 - 0.110 M/uL    Immature Retic Fraction 33.00 (H) 0.05 - 0.25 %     Retic Hgb Conc. 35.8 28.2 - 36.6 pg   CBC WITH AUTOMATED DIFF    Collection Time: 07/03/15  2:30 AM   Result Value Ref Range    WBC 9.1 4.0 - 11.0 1000/mm3    RBC 2.60 (L) 3.60 - 5.20 M/uL    HGB 7.9 (L) 13.0 - 17.2 gm/dl    HCT 16.1 (L) 09.6 - 50.0 %    MCV 90.4 80.0 - 98.0 fL    MCH 30.4 25.4 - 34.6 pg    MCHC 33.6 30.0 - 36.0 gm/dl    PLATELET 045 409 - 811 1000/mm3    MPV 12.5 (H) 6.0 - 10.0 fL    RDW-SD 51.6 (H) 36.4 - 46.3      NRBC 0 0 - 0      IMMATURE GRANULOCYTES 3.0 0.0 - 3.0 %    NEUTROPHILS 54.5 34 - 64 %    LYMPHOCYTES 29.1 28 - 48 %  MONOCYTES 6.8 1 - 13 %    EOSINOPHILS 6.1 (H) 0 - 5 %    BASOPHILS 0.5 0 - 3 %   IRON PROFILE    Collection Time: 07/03/15  2:30 AM   Result Value Ref Range    Iron 55 50 - 170 mcg/dl    TIBC 161 096 - 045 mcg/dl    Iron % saturation 19 (L) 20 - 45 %   FERRITIN    Collection Time: 07/03/15  2:30 AM   Result Value Ref Range    Ferritin 451.2 (H) 8.0 - 252.0 ng/ml   FOLATE    Collection Time: 07/03/15  2:30 AM   Result Value Ref Range    Folate 10.9 3.1 - 17.5 ng/ml   VITAMIN B12    Collection Time: 07/03/15  2:30 AM   Result Value Ref Range    Vitamin B12 287 193 - 986 pg/ml   RENAL FUNCTION PANEL    Collection Time: 07/03/15  2:30 AM   Result Value Ref Range    Sodium 140 136 - 145 mEq/L    Potassium 3.3 (L) 3.5 - 5.1 mEq/L    Chloride 107 98 - 107 mEq/L    CO2 22 21 - 32 mEq/L    Glucose 94 74 - 106 mg/dl    BUN 27 (H) 7 - 25 mg/dl    Creatinine 5.0 (H) 0.6 - 1.3 mg/dl    GFR est AA 40.9      GFR est non-AA 11      Calcium 7.8 (L) 8.5 - 10.1 mg/dl    Albumin 2.5 (L) 3.4 - 5.0 gm/dl    Phosphorus 3.0 2.5 - 4.9 mg/dl   VITAMIN D, 25 HYDROXY    Collection Time: 07/03/15  2:30 AM   Result Value Ref Range    Vitamin D, 25-OH, Total 11.7 (L) 30.0 - 100.0 ng/ml   PTH INTACT    Collection Time: 07/03/15  2:30 AM   Result Value Ref Range    PTH, Intact 340.3 (H) 14.0 - 72.0 pg/ml       Problem List:  Problem List as of 07/03/2015  Never Reviewed           Codes Class Noted - Resolved    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 epoetin alfa (EPOGEN;PROCRIT) injection 5,000 Units  5,000 Units SubCUTAneous Q MON, WED & FRI   ??? albuterol CONCENTRATE 2.5mg /0.5 mL neb soln  2.5 mg Nebulization Q6H PRN   ??? pantoprazole (PROTONIX) tablet 40 mg  40 mg Oral ACB   ??? 0.9% sodium chloride infusion 250 mL  250 mL IntraVENous PRN   ??? acetaminophen (TYLENOL) tablet 650 mg  650 mg Oral Q6H PRN   ??? calcium gluconate 2 g in 0.9% sodium chloride 100 mL IVPB  2 g IntraVENous PRN   ??? diphenhydrAMINE (BENADRYL) injection 50 mg  50 mg IntraVENous Q6H PRN   ??? amLODIPine (NORVASC) tablet 10 mg  10 mg Oral DAILY   ??? carvedilol (COREG) tablet 25 mg  25 mg Oral BID WITH MEALS   ??? sodium chloride (NS) flush 5-10 mL  5-10 mL IntraVENous Q8H   ??? sodium chloride (NS) flush 5-10 mL  5-10 mL IntraVENous PRN   ??? naloxone (NARCAN) injection 0.1  mg  0.1 mg IntraVENous PRN   ??? docusate sodium (COLACE) capsule 100 mg  100 mg Oral DAILY PRN   ??? zolpidem (AMBIEN) tablet 5 mg  5 mg Oral QHS PRN   ??? cefTRIAXone (ROCEPHIN) 2 g in 0.9% sodium chloride (MBP/ADV) 50 mL MBP  2 g IntraVENous Q24H   ??? azithromycin (ZITHROMAX) 500 mg in 0.9% sodium chloride 250 mL IVPB  500 mg IntraVENous Q24H        Care Plan discussed with: pt, Drs Dierdre Searles    Total time spent with patient: 20 minutes.    Dorothe Pea, MD  July 03, 2015  1430

## 2015-07-03 NOTE — Progress Notes (Signed)
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  Stable.  2 -- HTN.  Better.  3 -- MAHA.  This was due to htn and cocaine not due to TTP.  4 -- Cocaine abuse.  5 -- Blurry vision.    Plan:  1 -- Add EPO.  2 -- D/C IVF>  3 -- D/C prednisone.  4 -- If stable tomorrow OK for discharge.  5 -- I will talk to [facility] nephrology about follow up for her.  6 -- Consider talking to opthalmology about her blurry vision.    Thank you,    Bobbe Medico, MD, [facility]  [phone] Office  [phone] Pager                                                                                                                    Subjective  No SOB/CP/N/V.  Vision still blurry in both eyes.    Objective  Patient Vitals for the past 24 hrs:   Temp Pulse Resp BP SpO2   07/03/15 0910 - 92 - 129/75 -   07/03/15 0814 - (!) 103 - (!) 167/111 -   07/03/15 0813 98.7 ??F (37.1 ??C) 95 19 (!) 167/111 100 %   07/03/15 0157 - 97 24 (!) 149/105 96 %   07/03/15 0022 99.5 ??F (37.5 ??C) - - - -   07/02/15 2357 - 94 17 139/89 99 %   07/02/15 2157 - 92 27 132/79 100 %   07/02/15 2057 - 92 26 119/73 100 %   07/02/15 2008 99 ??F (37.2 ??C) - - - -   07/02/15 1957 - 96 23 113/67 100 %   07/02/15 1557 98.9 ??F (37.2 ??C) 100 16 135/88 98 %   07/02/15 1457 - 92 25 (!) 139/101 100 %   07/02/15 1357 97.8 ??F (36.6 ??C) 94 17 (!) 137/97 100 %   07/02/15 1257 - 89 24 135/90 97 %   07/02/15 1157 97.8 ??F (36.6 ??C) 87 17 129/87 100 %       Intake/Output Summary (Last 24 hours) at 07/03/15 1035  Last data filed at 07/03/15 0644   Gross per 24 hour   Intake          3218.75 ml   Output                1 ml   Net          3217.75 ml       Physical Assessment:         GNF:AOZH, NAD  HEENT: face is symmetric No periorbital edema  LUNGS:Clear to Auscultation, No Rales or Rhonchi.Good Air Entry  CVS EXM: Heart RRR, No S4 or S3 Gallop, No Pericardial Rub.   Abdomen: Soft, Non Tender, No CVA tenderness. Bowel Sounds normal.  Lower Extremities: No Pitting Edema. No  deformities  SKIN:No Rashes,Normal Turgor  Neurological exam reveals:Awake alert and conversant.  MAE equally         Lab  Recent Results (from the past 24 hour(s))   CALCIUM, IONIZED    Collection Time: 07/02/15 11:49 AM   Result Value Ref Range    CALCIUM,IONIZED 4.5 4.4 - 5.4 mg/dl   PROTHROMBIN TIME + INR    Collection Time: 07/02/15 11:49 AM   Result Value Ref Range    Prothrombin time 13.3 11.5 - 14.0 seconds    INR 1.0 0.1 - 1.1     PTT    Collection Time: 07/02/15 11:49 AM   Result Value Ref Range    aPTT 34.3 24.9 - 35.6 seconds   LD    Collection Time: 07/03/15  2:30 AM   Result Value Ref Range    LD 439 (H) 84 - 246 U/L   RETICULOCYTE COUNT    Collection Time: 07/03/15  2:30 AM   Result Value Ref Range    Reticulocyte count 6.12 (H) 0.50 - 1.50 %    Absolute Retic Cnt. 0.159 (H) 0.020 - 0.110 M/uL    Immature Retic Fraction 33.00 (H) 0.05 - 0.25 %    Retic Hgb Conc. 35.8 28.2 - 36.6 pg   CBC WITH AUTOMATED DIFF    Collection Time: 07/03/15  2:30 AM   Result Value Ref Range    WBC 9.1 4.0 - 11.0 1000/mm3    RBC 2.60 (L) 3.60 - 5.20 M/uL    HGB 7.9 (L) 13.0 - 17.2 gm/dl    HCT 16.1 (L) 09.6 - 50.0 %    MCV 90.4 80.0 - 98.0 fL    MCH 30.4 25.4 - 34.6 pg    MCHC 33.6 30.0 - 36.0 gm/dl    PLATELET 045 409 - 811 1000/mm3    MPV 12.5 (H) 6.0 - 10.0 fL    RDW-SD 51.6 (H) 36.4 - 46.3      NRBC 0 0 - 0      IMMATURE GRANULOCYTES 3.0 0.0 - 3.0 %    NEUTROPHILS 54.5 34 - 64 %    LYMPHOCYTES 29.1 28 - 48 %    MONOCYTES 6.8 1 - 13 %    EOSINOPHILS 6.1 (H) 0 - 5 %    BASOPHILS 0.5 0 - 3 %   IRON PROFILE    Collection Time: 07/03/15  2:30 AM   Result Value Ref Range    Iron 55 50 - 170 mcg/dl    TIBC 914 782 - 956 mcg/dl    Iron % saturation 19 (L) 20 - 45 %   FERRITIN    Collection Time: 07/03/15  2:30 AM   Result Value Ref Range    Ferritin 451.2 (H) 8.0 - 252.0 ng/ml   FOLATE     Collection Time: 07/03/15  2:30 AM   Result Value Ref Range    Folate 10.9 3.1 - 17.5 ng/ml   VITAMIN B12    Collection Time: 07/03/15  2:30 AM   Result Value Ref Range    Vitamin B12 287 193 - 986 pg/ml   RENAL FUNCTION PANEL    Collection Time: 07/03/15  2:30 AM   Result Value Ref Range    Sodium 140 136 - 145 mEq/L    Potassium 3.3 (L) 3.5 - 5.1 mEq/L    Chloride 107 98 - 107 mEq/L    CO2 22 21 - 32 mEq/L    Glucose 94 74 - 106  mg/dl    BUN 27 (H) 7 - 25 mg/dl    Creatinine 5.0 (H) 0.6 - 1.3 mg/dl    GFR est AA 74.2      GFR est non-AA 11      Calcium 7.8 (L) 8.5 - 10.1 mg/dl    Albumin 2.5 (L) 3.4 - 5.0 gm/dl    Phosphorus 3.0 2.5 - 4.9 mg/dl   VITAMIN D, 25 HYDROXY    Collection Time: 07/03/15  2:30 AM   Result Value Ref Range    Vitamin D, 25-OH, Total 11.7 (L) 30.0 - 100.0 ng/ml   PTH INTACT    Collection Time: 07/03/15  2:30 AM   Result Value Ref Range    PTH, Intact 340.3 (H) 14.0 - 72.0 pg/ml

## 2015-07-03 NOTE — Other (Signed)
Bedside and Verbal shift change report given to [employer] (Cabin crew) by Tonye Royalty (offgoing nurse). Report included the following information SBAR and Kardex.

## 2015-07-03 NOTE — Progress Notes (Signed)
This SW received a referral to see this patient due to recent history of substance use- cocaine in the last week.  Spoke to patient and noted the reason for my visit.  She states that a family/ significant other argument lead to her using( trigger)  Spoke to her about substance abuse treatment with [facility] and mentioned counseling services as well.  Patient accepted resources and has my card.  She received a phone call and this SW left my card in case needed.

## 2015-07-03 NOTE — Other (Deleted)
Dear Dr. Romeo Apple,    Patient is admitted for Pneumonia and AKI.  PER DR. RAKOWSKI  6. Congestive heart failure with an ejection fraction approximately 48% with stage II systolic dysfunction on echocardiogram done in [facility] this week.  Patient is on Coreg.    Please document if pt has    Acute Systolic CHF  Acute on chronic Systolic CHF  Chronic Systolic CHF  Other Conditions  Unable to Determine    Thank You,  Teresa Coombs MD, CCS, CCDS, CDIP  Clinical Documentation Specialist  [phone]

## 2015-07-04 LAB — RENAL FUNCTION PANEL
Albumin: 2.7 gm/dl — ABNORMAL LOW (ref 3.4–5.0)
BUN: 27 mg/dl — ABNORMAL HIGH (ref 7–25)
CO2: 21 mEq/L (ref 21–32)
Calcium: 8.6 mg/dl (ref 8.5–10.1)
Chloride: 107 mEq/L (ref 98–107)
Creatinine: 5.2 mg/dl — ABNORMAL HIGH (ref 0.6–1.3)
GFR est AA: 13
GFR est non-AA: 11
Glucose: 98 mg/dl (ref 74–106)
Phosphorus: 3 mg/dl (ref 2.5–4.9)
Potassium: 3.4 mEq/L — ABNORMAL LOW (ref 3.5–5.1)
Sodium: 139 mEq/L (ref 136–145)

## 2015-07-04 LAB — CBC W/O DIFF
HCT: 25.5 % — ABNORMAL LOW (ref 37.0–50.0)
HGB: 8.8 gm/dl — ABNORMAL LOW (ref 13.0–17.2)
MCH: 31.4 pg (ref 25.4–34.6)
MCHC: 34.5 gm/dl (ref 30.0–36.0)
MCV: 91.1 fL (ref 80.0–98.0)
MPV: 12.1 fL — ABNORMAL HIGH (ref 6.0–10.0)
PLATELET: 243 10*3/uL (ref 140–450)
RBC: 2.8 M/uL — ABNORMAL LOW (ref 3.60–5.20)
RDW-SD: 53.9 — ABNORMAL HIGH (ref 36.4–46.3)
WBC: 20.5 10*3/uL — ABNORMAL HIGH (ref 4.0–11.0)

## 2015-07-04 LAB — RETICULOCYTE COUNT
Absolute Retic Cnt.: 0.211 M/uL — ABNORMAL HIGH (ref 0.020–0.110)
Immature Retic Fraction: 37.7 % — ABNORMAL HIGH (ref 0.05–0.25)
Retic Hgb Conc.: 37.2 pg — ABNORMAL HIGH (ref 28.2–36.6)
Reticulocyte count: 7.55 % — ABNORMAL HIGH (ref 0.50–1.50)

## 2015-07-04 LAB — HEPATITIS C AB
Hepatitis C virus Ab: NONREACTIVE
Signal to Cutoff (Hep C): <0

## 2015-07-04 LAB — HEP B SURFACE AB: Hepatitis B surface Ab: REACTIVE

## 2015-07-04 LAB — HEP B SURFACE AG: Hepatitis B surface Ag: NONREACTIVE

## 2015-07-04 LAB — HEPATITIS C ANTIBODY
Hepatitis C Ab: NONREACTIVE
SIGNAL TO CUTOFF (HEP C): <0

## 2015-07-04 LAB — HEPATITIS B SURFACE ANTIBODY: Hep B S Ab: REACTIVE

## 2015-07-04 MED ORDER — LEVOFLOXACIN 750 MG TAB
750 mg | ORAL_TABLET | ORAL | 0 refills | Status: AC
Start: 2015-07-04 — End: 2015-07-09

## 2015-07-04 MED ORDER — CARVEDILOL 25 MG TAB
25 mg | ORAL_TABLET | Freq: Two times a day (BID) | ORAL | 3 refills | Status: AC
Start: 2015-07-04 — End: ?

## 2015-07-04 MED ORDER — AMLODIPINE 10 MG TAB
10 mg | ORAL_TABLET | Freq: Every day | ORAL | 3 refills | Status: AC
Start: 2015-07-04 — End: ?

## 2015-07-04 MED FILL — CARVEDILOL 25 MG TAB: 25 mg | ORAL | Qty: 2

## 2015-07-04 MED FILL — AMLODIPINE 5 MG TAB: 5 mg | ORAL | Qty: 2

## 2015-07-04 MED FILL — SODIUM CHLORIDE 0.9 % IV PIGGY BACK: INTRAVENOUS | Qty: 50

## 2015-07-04 MED FILL — PANTOPRAZOLE 40 MG TAB, DELAYED RELEASE: 40 mg | ORAL | Qty: 1

## 2015-07-04 MED FILL — AZITHROMYCIN 500 MG IV SOLUTION: 500 mg | INTRAVENOUS | Qty: 5

## 2015-07-04 NOTE — Discharge Summary (Signed)
[facility]  Discharge Summary   NAME:  [name] [name]  SEX:   F  ADMIT: 07/01/2015  DISCH: 07/04/2015  DOB:   09-Aug-1985  MR#    [id number]  ACCT#  [id number]    cc: Ruthell Rummage MD, Jackson Latino MD, Bertram Savin MD, MARIA [facility]     DATE OF ADMISSION:  07/01/2015.    DATE OF DISCHARGE:  07/04/2015.    Please refer to admission history and physical by Dr. Earlie Lou, as well as   consultation by Dr. Benedetto Goad (nephrology), and Dr. Jerrye Bushy   (hematology/oncology) for pertinent past medical problems as well as details   leading to this hospital stay.    Briefly, the patient is a 30 year old female with past medical history   significant for hypertension, cocaine abuse, chronic kidney disease.  She was   hospitalized at [facility] in [place] in 04/2015, and noted to   have an elevated serum creatinine in the 4 to 5 range.  At that time, she was   noted to be anemic, thrombocytopenic with platelets in the 40s, and apparently   thrombocytopenia spontaneously improved with control of her hypertension.    She initially presented to [facility] complaining of cough and   dyspnea.  She also complained of headache and some blurring of vision.  Cough   at times was productive of scant hemoptysis.  Chest x-ray showed bilateral   patchy infiltrate and she was diagnosed with community-acquired pneumonia, and   started on ceftriaxone and azithromycin.  An echocardiogram showed slightly   diminished LV systolic function with EF of 45% to 50%.  They noted a component   of grade 2 diastolic dysfunction and mild global hypokinesia, probably from   longstanding untreated hypertension.  She had a V/Q scan, which was low   probability for pulmonary embolism.  She was noted to be hyponatremic,   hypokalemic, creatinine around 5, thrombocytopenic.  Apparently she had an   elevated LDH, and the concern was for TTP.  She was seen by nephrology briefly    while there, and Dr. Jerrye Bushy from hematology/oncology.  Dr. Jerrye Bushy suggested   transfer to this facility for possible plasmapheresis for TTP.    Upon presentation here, she had a temperature of 98.2, blood pressure 132/90,   pulse in the 80s, O2 sat was 100%.    Initial laboratory studies here showed WBC 9.1, hemoglobin 8.4, hematocrit   24.3, platelets 127.  Reticulocyte count 5.5.  INR was 1.  Sodium 133,   potassium 3.3, chloride 98, CO2 of 26, glucose 122, BUN 33, creatinine 5.3,   ALT 10, AST 8, alkaline phosphatase 96.  Chest x-ray showed no acute   cardiopulmonary process.  She was continued on azithromycin and ceftriaxone   for possible community-acquired pneumonia, she was placed back on her usual   antihypertensive medications including carvedilol and amlodipine, and she was   placed on telemetry monitoring.    Subjectively her only complaint here was some blurring of her vision.  Her   cough resolved.  No hemoptysis.  No shortness of breath.  She was seen in   consultation by Dr. Jerrye Bushy from hematology/oncology, and Dr. Benedetto Goad from   nephrology.  They did review her records from [facility], which was a   very similar presentation.  They did not feel that plasmapheresis was   indicated, given the fact her platelet count was normal upon admission here.    Dr. Loreli Slot feeling is that the  patient has chronic kidney disease due to   uncontrolled hypertension and cocaine use.  It is felt that the   thrombocytopenia is more related to MAHA from hypertension and cocaine.    The patient was observed for 48 hours.  Platelet count remained normal.  Her   blurry vision has improved, although not resolved.  I have strongly urged her   to have a formal ophthalmology evaluation as an outpatient.  She will need   close followup of her renal function, and Dr. Benedetto Goad has contacted Dr.   Broadus John in [place].  She needs strict control of her blood pressure.     Currently, she has been afebrile.  Blood pressure is fairly well controlled.    On exam today her lungs are clear.  She voices no specific complaints other   than mild blurry vision.    MOST RECENT LABORATORY STUDIES:  06/24/2015:  WBC 20.5 (she had been treated   with some steroids), hemoglobin 8.8, hematocrit 25.5, platelets 243.  Sodium   139, potassium 3.4, chloride 107, CO2 of 21, glucose 98, BUN 27, creatinine   5.2.  Other pertinent laboratory studies during hospital stay:  Iron was 55,   TIBC 290, iron sat 19, ferritin 451.  Swab for influenza was negative.  Urine   pneumococcal and legionella antigens negative.    DISCHARGE DIAGNOSES:  1.  Community-acquired pneumonia, will complete 3 more doses of Levaquin.  2.  Hypertension.  3.  Chronic kidney disease, stage IV to V.  4.  Anemia, probably from chronic kidney disease.  5.  Thrombocytopenia, presumably from MAHA from uncontrolled hypertension and   cocaine.  6.  Ongoing tobacco use, she has been counseled regarding smoking cessation.  7.  Cocaine abuse, she has been counseled regarding complete cessation, as it   certainly has interfered with her health.  8.  Chronic diastolic heart failure noted on echocardiogram, presumably from   longstanding hypertension.    FOLLOWUP:  She apparently is to establish with Dr. Drake Leach for primary   care physician, and she should follow up within a week.  Again, she needs   strict blood pressure control.  She should have repeat CBC and BMP.  She   should follow up with Dr. Broadus John from [facility] nephrology within a few weeks,   and Dr. Benedetto Goad has spoken with him.  She should follow up with an   ophthalmologist for a formal eye exam.    She did have some atypical findings from an MRI of the brain that was done at   [facility], she should have followup of this with a repeat MRI within   a few months.    Total time to coordinate discharge was 40 minutes.      ___________________  Charisse Klinefelter MD   Dictated By: .   NS  D:07/04/2015 16:10:96  T: 07/04/2015 16:07:57  [id number]

## 2015-07-04 NOTE — Progress Notes (Signed)
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  Stable.  2 -- HTN.  Stable  3 -- MAHA.  This was due to htn and cocaine not due to TTP.  4 -- Cocaine abuse.  5 -- Blurry vision.    Plan:  1 -- Ok for discharge from my standpoint.  2 -- She will need to follow up with [facility] and I have briefly been in contact with Dr. Broadus John and will speak to him at length later.  Their phone number is [phone].    Thank you,    Bobbe Medico, MD, [facility]  [phone] Office  [phone] Pager                                                                                                                    Subjective  No SOB/CP/N/V.    Objective  Patient Vitals for the past 24 hrs:   Temp Pulse Resp BP SpO2   07/04/15 1133 97.6 ??F (36.4 ??C) 93 19 132/80 100 %   07/04/15 0822 98.3 ??F (36.8 ??C) (!) 103 16 (!) 157/101 100 %   07/04/15 0503 98.3 ??F (36.8 ??C) (!) 103 18 (!) 142/92 100 %   07/04/15 0126 99 ??F (37.2 ??C) (!) 102 16 137/77 100 %   07/03/15 1957 98.1 ??F (36.7 ??C) 94 24 (!) 148/97 100 %   07/03/15 1705 98.5 ??F (36.9 ??C) 96 17 (!) 153/98 100 %   07/03/15 1155 98.6 ??F (37 ??C) 90 18 116/78 100 %     No intake or output data in the 24 hours ending 07/04/15 1137    Physical Assessment:         WUX:LKGM, NAD  HEENT: face is symmetric No periorbital edema  LUNGS:Clear to Auscultation, No Rales or Rhonchi.Good Air Entry  CVS EXM: Heart RRR, No S4 or S3 Gallop, No Pericardial Rub.  Abdomen: Soft, Non Tender, No CVA tenderness. Bowel Sounds normal.  Lower Extremities: No Pitting Edema. No deformities  SKIN:No Rashes,Normal Turgor  Neurological exam reveals:Awake alert and conversant.  MAE equally         Lab  Recent Results (from the past 24 hour(s))   RETICULOCYTE COUNT    Collection Time: 07/04/15  3:18 AM   Result Value Ref Range    Reticulocyte count 7.55 (H) 0.50 - 1.50 %     Absolute Retic Cnt. 0.211 (H) 0.020 - 0.110 M/uL    Immature Retic Fraction 37.70 (H) 0.05 - 0.25 %    Retic Hgb Conc. 37.2 (H) 28.2 - 36.6 pg   RENAL FUNCTION PANEL    Collection Time: 07/04/15  3:18 AM   Result Value Ref Range    Sodium 139 136 - 145 mEq/L    Potassium 3.4 (L) 3.5 - 5.1 mEq/L    Chloride 107 98 - 107  mEq/L    CO2 21 21 - 32 mEq/L    Glucose 98 74 - 106 mg/dl    BUN 27 (H) 7 - 25 mg/dl    Creatinine 5.2 (H) 0.6 - 1.3 mg/dl    GFR est AA 25.9      GFR est non-AA 11      Calcium 8.6 8.5 - 10.1 mg/dl    Albumin 2.7 (L) 3.4 - 5.0 gm/dl    Phosphorus 3.0 2.5 - 4.9 mg/dl   CBC W/O DIFF    Collection Time: 07/04/15  3:18 AM   Result Value Ref Range    WBC 20.5 (H) 4.0 - 11.0 1000/mm3    RBC 2.80 (L) 3.60 - 5.20 M/uL    HGB 8.8 (L) 13.0 - 17.2 gm/dl    HCT 56.3 (L) 87.5 - 50.0 %    MCV 91.1 80.0 - 98.0 fL    MCH 31.4 25.4 - 34.6 pg    MCHC 34.5 30.0 - 36.0 gm/dl    PLATELET 643 329 - 518 1000/mm3    MPV 12.1 (H) 6.0 - 10.0 fL    RDW-SD 53.9 (H) 36.4 - 46.3

## 2015-07-04 NOTE — Progress Notes (Signed)
Problem: Falls - Risk of  Goal: *Absence of falls  Outcome: Progressing Towards Goal  Pt instructed on use of call bell when needing assistance. Pt wearing yellow fall risk socks with armband and sign placed outside of door. Pt verbalizes understanding of above.

## 2015-07-04 NOTE — Progress Notes (Signed)
Bedside and Verbal shift change report given to Brooke, RN (oncoming nurse) by Gayle, RN (offgoing nurse). Report included the following information SBAR.

## 2015-07-04 NOTE — Progress Notes (Signed)
Patient discharged to home gave all discharge instructions and prescriptions. Patient states she understands all information. No distress noted at time of discharge. Will be escorted to lobby by nursing staff

## 2015-07-05 LAB — COMPLEMENT, C3 & C4
COMPLEMENT,C4: 29 mg/dL (ref 16–47)
Complement C3: 124 mg/dL (ref 90–180)

## 2016-05-17 HISTORY — PX: ECTOPIC PREGNANCY SURGERY: SHX613

## 2017-09-12 ENCOUNTER — Inpatient Hospital Stay (HOSPITAL_COMMUNITY)
Admission: EM | Admit: 2017-09-12 | Discharge: 2017-09-15 | DRG: 304 | Disposition: A | Discharge status: Home / Self Care | Payer: Medicaid Other | Attending: Internal Medicine | Admitting: Internal Medicine

## 2017-09-12 ENCOUNTER — Emergency Department (HOSPITAL_COMMUNITY): Payer: Medicaid Other

## 2017-09-12 ENCOUNTER — Other Ambulatory Visit: Payer: Self-pay

## 2017-09-12 ENCOUNTER — Encounter (HOSPITAL_COMMUNITY): Payer: Self-pay

## 2017-09-12 DIAGNOSIS — I10 Essential (primary) hypertension: Secondary | ICD-10-CM

## 2017-09-12 DIAGNOSIS — F1410 Cocaine abuse, uncomplicated: Secondary | ICD-10-CM | POA: Diagnosis present

## 2017-09-12 DIAGNOSIS — N189 Chronic kidney disease, unspecified: Secondary | ICD-10-CM | POA: Diagnosis present

## 2017-09-12 DIAGNOSIS — I2721 Secondary pulmonary arterial hypertension: Secondary | ICD-10-CM | POA: Diagnosis present

## 2017-09-12 DIAGNOSIS — E8889 Other specified metabolic disorders: Secondary | ICD-10-CM | POA: Diagnosis present

## 2017-09-12 DIAGNOSIS — E213 Hyperparathyroidism, unspecified: Secondary | ICD-10-CM | POA: Diagnosis present

## 2017-09-12 DIAGNOSIS — Z86718 Personal history of other venous thrombosis and embolism: Secondary | ICD-10-CM

## 2017-09-12 DIAGNOSIS — Z79899 Other long term (current) drug therapy: Secondary | ICD-10-CM

## 2017-09-12 DIAGNOSIS — I361 Nonrheumatic tricuspid (valve) insufficiency: Secondary | ICD-10-CM

## 2017-09-12 DIAGNOSIS — I5020 Unspecified systolic (congestive) heart failure: Secondary | ICD-10-CM | POA: Diagnosis present

## 2017-09-12 DIAGNOSIS — N179 Acute kidney failure, unspecified: Secondary | ICD-10-CM | POA: Diagnosis present

## 2017-09-12 DIAGNOSIS — F17210 Nicotine dependence, cigarettes, uncomplicated: Secondary | ICD-10-CM | POA: Diagnosis present

## 2017-09-12 DIAGNOSIS — Z8659 Personal history of other mental and behavioral disorders: Secondary | ICD-10-CM | POA: Diagnosis not present

## 2017-09-12 DIAGNOSIS — I161 Hypertensive emergency: Secondary | ICD-10-CM | POA: Diagnosis present

## 2017-09-12 DIAGNOSIS — N271 Small kidney, bilateral: Secondary | ICD-10-CM | POA: Diagnosis present

## 2017-09-12 DIAGNOSIS — I714 Abdominal aortic aneurysm, without rupture: Secondary | ICD-10-CM | POA: Diagnosis present

## 2017-09-12 DIAGNOSIS — N289 Disorder of kidney and ureter, unspecified: Secondary | ICD-10-CM

## 2017-09-12 DIAGNOSIS — N184 Chronic kidney disease, stage 4 (severe): Secondary | ICD-10-CM

## 2017-09-12 DIAGNOSIS — R402414 Glasgow coma scale score 13-15, 24 hours or more after hospital admission: Secondary | ICD-10-CM | POA: Diagnosis present

## 2017-09-12 DIAGNOSIS — D509 Iron deficiency anemia, unspecified: Secondary | ICD-10-CM | POA: Diagnosis present

## 2017-09-12 DIAGNOSIS — I472 Ventricular tachycardia: Secondary | ICD-10-CM | POA: Diagnosis present

## 2017-09-12 DIAGNOSIS — I132 Hypertensive heart and chronic kidney disease with heart failure and with stage 5 chronic kidney disease, or end stage renal disease: Secondary | ICD-10-CM | POA: Diagnosis present

## 2017-09-12 DIAGNOSIS — I7101 Dissection of thoracic aorta: Secondary | ICD-10-CM | POA: Diagnosis present

## 2017-09-12 DIAGNOSIS — M545 Low back pain: Secondary | ICD-10-CM | POA: Diagnosis present

## 2017-09-12 DIAGNOSIS — R52 Pain, unspecified: Secondary | ICD-10-CM

## 2017-09-12 DIAGNOSIS — I71019 Dissection of thoracic aorta, unspecified: Secondary | ICD-10-CM | POA: Diagnosis present

## 2017-09-12 DIAGNOSIS — F1910 Other psychoactive substance abuse, uncomplicated: Secondary | ICD-10-CM | POA: Diagnosis present

## 2017-09-12 DIAGNOSIS — N185 Chronic kidney disease, stage 5: Secondary | ICD-10-CM | POA: Diagnosis present

## 2017-09-12 DIAGNOSIS — M311 Thrombotic microangiopathy: Secondary | ICD-10-CM | POA: Diagnosis present

## 2017-09-12 DIAGNOSIS — D631 Anemia in chronic kidney disease: Secondary | ICD-10-CM | POA: Diagnosis present

## 2017-09-12 DIAGNOSIS — I071 Rheumatic tricuspid insufficiency: Secondary | ICD-10-CM | POA: Diagnosis present

## 2017-09-12 DIAGNOSIS — I7100 Dissection of unspecified site of aorta: Secondary | ICD-10-CM | POA: Diagnosis present

## 2017-09-12 DIAGNOSIS — I43 Cardiomyopathy in diseases classified elsewhere: Secondary | ICD-10-CM | POA: Diagnosis present

## 2017-09-12 DIAGNOSIS — R000 Tachycardia, unspecified: Secondary | ICD-10-CM

## 2017-09-12 DIAGNOSIS — I7102 Dissection of abdominal aorta: Secondary | ICD-10-CM

## 2017-09-12 DIAGNOSIS — I509 Heart failure, unspecified: Secondary | ICD-10-CM

## 2017-09-12 HISTORY — DX: Alcohol abuse, in remission: F10.11

## 2017-09-12 HISTORY — DX: Dissection of unspecified site of aorta: I71.00

## 2017-09-12 HISTORY — DX: Other thrombotic microangiopathy: M31.19

## 2017-09-12 HISTORY — DX: Thrombotic microangiopathy: M31.1

## 2017-09-12 HISTORY — DX: Disorder of kidney and ureter, unspecified: N28.9

## 2017-09-12 LAB — I-STAT CREATININE, ED: CREATININE: 4.4 mg/dL — AB (ref 0.44–1.00)

## 2017-09-12 LAB — COMPREHENSIVE METABOLIC PANEL
ALBUMIN: 3.4 g/dL — AB (ref 3.5–5.0)
ALK PHOS: 187 U/L — AB (ref 38–126)
ALT: 10 U/L — ABNORMAL LOW (ref 14–54)
AST: 16 U/L (ref 15–41)
Anion gap: 12 (ref 5–15)
BILIRUBIN TOTAL: 0.8 mg/dL (ref 0.3–1.2)
BUN: 29 mg/dL — AB (ref 6–20)
CALCIUM: 8.9 mg/dL (ref 8.9–10.3)
CO2: 18 mmol/L — AB (ref 22–32)
Chloride: 107 mmol/L (ref 101–111)
Creatinine, Ser: 4.23 mg/dL — ABNORMAL HIGH (ref 0.44–1.00)
GFR calc Af Amer: 15 mL/min — ABNORMAL LOW (ref >60)
GFR calc non Af Amer: 13 mL/min — ABNORMAL LOW (ref >60)
GLUCOSE: 102 mg/dL — AB (ref 65–99)
Potassium: 3.8 mmol/L (ref 3.5–5.1)
SODIUM: 137 mmol/L (ref 135–145)
TOTAL PROTEIN: 6.9 g/dL (ref 6.5–8.1)

## 2017-09-12 LAB — I-STAT CHEM 8, ED
BUN: 33 mg/dL — AB (ref 6–20)
CALCIUM ION: 1.1 mmol/L — AB (ref 1.15–1.40)
CREATININE: 4.1 mg/dL — AB (ref 0.44–1.00)
Chloride: 109 mmol/L (ref 101–111)
Glucose, Bld: 101 mg/dL — ABNORMAL HIGH (ref 65–99)
HCT: 37 % (ref 36.0–46.0)
HEMOGLOBIN: 12.6 g/dL (ref 12.0–15.0)
Potassium: 3.9 mmol/L (ref 3.5–5.1)
SODIUM: 137 mmol/L (ref 135–145)
TCO2: 18 mmol/L — AB (ref 22–32)

## 2017-09-12 LAB — I-STAT CG4 LACTIC ACID, ED
Lactic Acid, Venous: 1.16 mmol/L (ref 0.5–1.9)
Lactic Acid, Venous: 1.15 mmol/L (ref 0.5–1.9)

## 2017-09-12 LAB — I-STAT BETA HCG BLOOD, ED (MC, WL, AP ONLY): I-stat hCG, quantitative: <5 m[IU]/mL (ref <5)

## 2017-09-12 LAB — I-STAT TROPONIN, ED: TROPONIN I, POC: 0.04 ng/mL (ref 0.00–0.08)

## 2017-09-12 LAB — BRAIN NATRIURETIC PEPTIDE: B Natriuretic Peptide: >4500 pg/mL — ABNORMAL HIGH (ref 0.0–100.0)

## 2017-09-12 LAB — TSH: TSH: 1.856 u[IU]/mL (ref 0.350–4.500)

## 2017-09-12 LAB — TYPE AND SCREEN
ABO/RH(D): O POS
ANTIBODY SCREEN: NEGATIVE

## 2017-09-12 LAB — ABO/RH: ABO/RH(D): O POS

## 2017-09-12 MED ORDER — FENTANYL CITRATE (PF) 100 MCG/2ML IJ SOLN
INTRAMUSCULAR | Status: AC
  Administered 2017-09-12: 25 ug
  Filled 2017-09-12: qty 4

## 2017-09-12 MED ORDER — CARVEDILOL 25 MG PO TABS
25.0000 mg | ORAL_TABLET | Freq: Two times a day (BID) | ORAL | Status: DC
  Administered 2017-09-12 – 2017-09-15 (×6): 25 mg via ORAL
  Filled 2017-09-12 (×4): qty 1
  Filled 2017-09-12 (×2): qty 2

## 2017-09-12 MED ORDER — FENTANYL CITRATE (PF) 100 MCG/2ML IJ SOLN
INTRAMUSCULAR | Status: AC | PRN
  Administered 2017-09-12: 25 ug via INTRAVENOUS

## 2017-09-12 MED ORDER — HYDRALAZINE HCL 25 MG PO TABS
25.0000 mg | ORAL_TABLET | Freq: Three times a day (TID) | ORAL | Status: DC
  Administered 2017-09-12 – 2017-09-15 (×7): 25 mg via ORAL
  Filled 2017-09-12 (×7): qty 1

## 2017-09-12 MED ORDER — CLONIDINE HCL 0.1 MG PO TABS
0.1000 mg | ORAL_TABLET | ORAL | Status: AC
Start: 2017-09-12 — End: 2017-09-12
  Administered 2017-09-12: 0.1 mg via ORAL
  Filled 2017-09-12: qty 1

## 2017-09-12 MED ORDER — AMLODIPINE BESYLATE 10 MG PO TABS
10.0000 mg | ORAL_TABLET | Freq: Every day | ORAL | Status: DC
  Administered 2017-09-13 – 2017-09-15 (×3): 10 mg via ORAL
  Filled 2017-09-12 (×2): qty 1
  Filled 2017-09-12: qty 2

## 2017-09-12 MED ORDER — ACETAMINOPHEN 650 MG RE SUPP: 650.0000 mg | Freq: Four times a day (QID) | RECTAL | Status: DC | PRN

## 2017-09-12 MED ORDER — MIDAZOLAM HCL 2 MG/2ML IJ SOLN
INTRAMUSCULAR | Status: AC | PRN
  Administered 2017-09-12: 2 mg via INTRAVENOUS

## 2017-09-12 MED ORDER — ESMOLOL HCL-SODIUM CHLORIDE 2000 MG/100ML IV SOLN
25.0000 ug/kg/min | INTRAVENOUS | Status: DC
  Administered 2017-09-12: 125 ug/kg/min via INTRAVENOUS
  Administered 2017-09-12: 150 ug/kg/min via INTRAVENOUS
  Administered 2017-09-13: 100 ug/kg/min via INTRAVENOUS
  Filled 2017-09-12 (×4): qty 100

## 2017-09-12 MED ORDER — SODIUM CHLORIDE 0.9 % IV SOLN: INTRAVENOUS | Status: DC

## 2017-09-12 MED ORDER — ACETAMINOPHEN 325 MG PO TABS: 650.0000 mg | ORAL_TABLET | Freq: Four times a day (QID) | ORAL | Status: DC | PRN

## 2017-09-12 MED ORDER — CLONIDINE HCL 0.2 MG PO TABS
0.3000 mg | ORAL_TABLET | Freq: Three times a day (TID) | ORAL | Status: DC
  Administered 2017-09-12 – 2017-09-15 (×7): 0.3 mg via ORAL
  Filled 2017-09-12 (×8): qty 1

## 2017-09-12 MED ORDER — ESMOLOL BOLUS VIA INFUSION
500.0000 ug/kg | Freq: Once | INTRAVENOUS | Status: AC
  Administered 2017-09-12: 30150 ug via INTRAVENOUS
  Filled 2017-09-12: qty 31000

## 2017-09-12 MED ORDER — ESMOLOL HCL-SODIUM CHLORIDE 2000 MG/100ML IV SOLN
50.0000 ug/kg/min | INTRAVENOUS | Status: DC
  Administered 2017-09-12: 50 ug/kg/min via INTRAVENOUS
  Filled 2017-09-12 (×2): qty 100

## 2017-09-12 MED ORDER — NICARDIPINE HCL IN NACL 20-0.86 MG/200ML-% IV SOLN
INTRAVENOUS | Status: AC
  Administered 2017-09-12: 9 mg via INTRAVENOUS
  Filled 2017-09-12: qty 200

## 2017-09-12 MED ORDER — NICARDIPINE HCL IN NACL 20-0.86 MG/200ML-% IV SOLN
3.0000 mg/h | Freq: Once | INTRAVENOUS | Status: AC
  Administered 2017-09-12: 9 mg via INTRAVENOUS
  Administered 2017-09-12: 5 mg/h via INTRAVENOUS
  Filled 2017-09-12: qty 200

## 2017-09-12 MED ORDER — MIDAZOLAM HCL 2 MG/2ML IJ SOLN
INTRAMUSCULAR | Status: AC
  Administered 2017-09-12: 2 mg
  Filled 2017-09-12: qty 4

## 2017-09-12 MED ORDER — FUROSEMIDE 20 MG PO TABS
20.0000 mg | ORAL_TABLET | Freq: Two times a day (BID) | ORAL | Status: DC
  Administered 2017-09-12 – 2017-09-14 (×4): 20 mg via ORAL
  Filled 2017-09-12 (×4): qty 1

## 2017-09-12 NOTE — H&P (Addendum)
TRH H&P   Patient Demographics:    Valerie Mathis, is a 33 y.o. female  MRN: [id number]   DOB - 1985-07-24  Admit Date - 09/12/2017  Outpatient Primary MD for the patient is Patient, No Pcp Per  Referring MD/NP/PA:  Dr. Wilson Singer  Outpatient Specialists:    Patient coming from: home  Chief Complaint  Patient presents with  . Back Pain      HPI:    Valerie Mathis  is a 32 y.o. female, w hypertension, CHF, CKD stage 4, TTP presents with back pain, earlier today.    In ED,  There was concern for aortic dissection and pt had TEE=>   The aortic root is normal.   There is a graft repair of the ascending aorta and proximal aortic arch. Both the proximal and the distal graft anastomosis appear to be intact.  There is (probably chronic) dissection of the proximal portion of the descending aorta from the distal graft anastomosis to 25 cm from the incisors. An intimal tear is readily identified. The true and false lumen are roughly equal in diameter and the false lumen is distally thrombosed. The aortic diameter is fairly normal (28 mm) in this area.   Beyond the area of dissection there is relatively healthy aorta up to 33 cm from the incisors.  At 33 cm from the incisors, there is an area of ulcerated intramural hematoma, relatively short in extent (approximately 2 cm). This may be a new abnormality and responsible for the current symptoms.  Beyond the intramural hematoma, the descending aorta can be followed to the celiac and SMA arteries and appears to be normal. The renal arteries could not be visualized.   Ultrasound abdomen =>  IMPRESSION: 1. Cholelithiasis with thick walled nondistended gallbladder, suggestive of chronic cholecystitis. 2. Echogenic renal parenchyma, a nonspecific finding often associated with medical renal disease. No hydronephrosis. 3. Trace abdominal  ascites. 4. 3.2 cm abdominal aortic aneurysm. Recommend followup by ultrasound in 3 years. This recommendation follows ACR consensus   Pt was started on cardene gtt and esomolol gtt  Pt will be admitted for hypertensive emergency and aortic dissection (chronic).       Review of systems:    In addition to the HPI above, No Fever-chills, No Headache, No changes with Vision or hearing, No problems swallowing food or Liquids, No Cough or Shortness of Breath, No Abdominal pain, No Nausea or Vommitting, Bowel movements are regular, No Blood in stool or Urine, No dysuria, No new skin rashes or bruises, No new joints pains-aches,  No new weakness, tingling, numbness in any extremity, No recent weight gain or loss, No polyuria, polydypsia or polyphagia, No significant Mental Stressors.  A full 10 point Review of Systems was done, except as stated above, all other Review of Systems were negative.   With Past History of the following :    Past  Medical History:  Diagnosis Date  . CHF (congestive heart failure) ([place])   . CKD (chronic kidney disease) stage 4, GFR 15-29 ml/min (H[facility])   . H/O: alcohol abuse   . Hypertension   . Renal disorder   . TTP (thrombotic thrombocytopenic purpura) (HCC)       Past Surgical History:  Procedure Laterality Date  . REPAIR OF ACUTE ASCENDING THORACIC AORTIC DISSECTION        Social History:     Social History   Tobacco Use  . Smoking status: Current Every Day Smoker    Types: Cigarettes  . Smokeless tobacco: Never Used  Substance Use Topics  . Alcohol use: Not Currently     Lives - at home  Mobility - walks by self    Family History :     Family History  Problem Relation Age of Onset  . Hypertension Mother       Home Medications:   Prior to Admission medications   Medication Sig Start Date End Date Taking? Authorizing Provider  amLODipine (NORVASC) 10 MG tablet Take 10 mg by mouth daily. for high blood pressure  07/21/17   [provider]  carvedilol (COREG) 25 MG tablet Take 25 mg by mouth 2 (two) times daily. For blood pressure/heart 07/21/17   [provider]  cloNIDine (CATAPRES) 0.3 MG tablet Take 0.3 mg by mouth 3 (three) times daily. For high blood pressure 07/21/17   [provider]  furosemide (LASIX) 20 MG tablet Take 20 mg by mouth 2 (two) times daily. For high blood pressure/fluid 07/21/17   [provider]  hydrALAZINE (APRESOLINE) 25 MG tablet Take 25 mg by mouth 3 (three) times daily. For high blood pressure 07/21/17   [provider]     Allergies:    No Known Allergies   Physical Exam:   Vitals  Blood pressure 134/76, pulse 83, temperature 99.4 F (37.4 C), temperature source Oral, resp. rate 14, height 5\' 3"  (1.6 m), weight 60.3 kg (133 lb), last menstrual period 07/15/2017, SpO2 100 %.   1. General  lying in bed in NAD,    2. Normal affect and insight, Not Suicidal or Homicidal, Awake Alert, Oriented X 3.  3. No F.N deficits, ALL C.Nerves Intact, Strength 5/5 all 4 extremities, Sensation intact all 4 extremities, Plantars down going.  4. Ears and Eyes appear Normal, Conjunctivae clear, PERRLA. Moist Oral Mucosa.  5. Supple Neck, No JVD, No cervical lymphadenopathy appriciated, No Carotid Bruits.  6. Symmetrical Chest wall movement, Good air movement bilaterally, CTAB.  7. RRR, No Gallops, Rubs or Murmurs, No Parasternal Heave.  8. Positive Bowel Sounds, Abdomen Soft, No tenderness, No organomegaly appriciated,No rebound -guarding or rigidity.  9.  No Cyanosis, Normal Skin Turgor, No Skin Rash or Bruise.  10. Good muscle tone,  joints appear normal , no effusions, Normal ROM.  11. No Palpable Lymph Nodes in Neck or Axillae     Data Review:    CBC Recent Labs  Lab 09/12/17 1538  HGB 12.6  HCT 37.0    ------------------------------------------------------------------------------------------------------------------  Chemistries  Recent Labs  Lab 09/12/17 1531 09/12/17 1537 09/12/17 1538  NA 137  --  137  K 3.8  --  3.9  CL 107  --  109  CO2 18*  --   --   GLUCOSE 102*  --  101*  BUN 29*  --  33*  CREATININE 4.23* 4.40* 4.10*  CALCIUM 8.9  --   --  AST 16  --   --   ALT 10*  --   --   ALKPHOS 187*  --   --   BILITOT 0.8  --   --    ------------------------------------------------------------------------------------------------------------------ estimated creatinine clearance is 16.7 mL/min (A) (by C-G formula based on SCr of 4.1 mg/dL (H)). ------------------------------------------------------------------------------------------------------------------ Recent Labs    09/12/17 1950  TSH 1.856    Coagulation profile No results for input(s): INR, PROTIME in the last 168 hours. ------------------------------------------------------------------------------------------------------------------- No results for input(s): DDIMER in the last 72 hours. -------------------------------------------------------------------------------------------------------------------  Cardiac Enzymes No results for input(s): CKMB, TROPONINI, MYOGLOBIN in the last 168 hours.  Invalid input(s): CK ------------------------------------------------------------------------------------------------------------------    Component Value Date/Time   BNP >4,500.0 (H) 09/12/2017 1531     ---------------------------------------------------------------------------------------------------------------  Urinalysis No results found for: COLORURINE, APPEARANCEUR, LABSPEC, PHURINE, GLUCOSEU, HGBUR, BILIRUBINUR, KETONESUR, PROTEINUR, UROBILINOGEN, NITRITE, LEUKOCYTESUR  ----------------------------------------------------------------------------------------------------------------   Imaging Results:    US  Abdomen Complete  Result Date: 09/12/2017 CLINICAL DATA:  Abdominal and back pain. Renal disorder, hypertension. EXAM: COMPLETE ABDOMINAL ULTRASOUND COMPARISON:  None available FINDINGS: Gallbladder: Wall thickening up to 10 mm. Stones in the dependent aspect of the gallbladder measuring up to 7 mm. Trace pericholecystic fluid. The gallbladder is incompletely distended. Common bile duct:  Normal in caliber, 1.79mm diameter. Liver: Homogeneous in echotexture without focal lesion or intrahepatic bile duct dilatation. IVC:  Negative Pancreas:  Negative Spleen:  No focal lesion, craniocaudal 9cm in length. Right Kidney: No mass or hydronephrosis, echogenic compared to the adjacent liver. 9.2cm in length. Left Kidney:  No lesion or hydronephrosis, 9.8cm in length. Abdominal aorta: Fusiform aneurysm measured up to 3.2 cm in the proximal segment. Trace perisplenic ascites. IMPRESSION: 1. Cholelithiasis with thick walled nondistended gallbladder, suggestive of chronic cholecystitis. 2. Echogenic renal parenchyma, a nonspecific finding often associated with medical renal disease. No hydronephrosis. 3. Trace abdominal ascites. 4. 3.2 cm abdominal aortic aneurysm. Recommend followup by ultrasound in 3 years. This recommendation follows ACR consensus guidelines: White Paper of the ACR Incidental Findings Committee II on Vascular Findings. [name] Coll Radiol 2013; 10:789-794 Electronically Signed   By: Lucrezia Europe M.D.   On: 09/12/2017 17:35       Assessment & Plan:    Principal Problem:   Hypertensive emergency Active Problems:   Back pain   Tachycardia    Hypertensive emergency Cont esmolol gtt, discontinue cardene, try to wean off esmolol after restart her bp medications, she forgot to take them this AM Cont Amlodipine 10mg  poq day Cont Carvedilol 25mg  po bid Cont clonidine 0.3mg  po tid Cont lasix 20mg  po bid Cont hydralazine 25mg  po tid  Chronic aortic dissection, AAA Appreciate CT surgery and cardiology  input  Tachycardia Tele Trop I q6h x3 If persistent may need further w/up    CKD stage4/5 Check cmp in am   DVT Prophylaxis   - SCDs  AM Labs Ordered, also please review Full Orders  Family Communication: Admission, patients condition and plan of care including tests being ordered have been discussed with the patient who indicate understanding and agree with the plan and Code Status.  Code Status  FULL CODE  Likely DC to  home  Condition GUARDED    Consults called:  Cardiology, CT surgery  Admission status: inpatient  Time spent in minutes : 45   Jani Gravel M.D on 09/12/2017 at 9:21 PM  Between 7am to 7pm - Pager - [phone] . After 7pm go to [email or web address] - password [email or web address]  [facility] -  Office  [phone]

## 2017-09-12 NOTE — Consult Note (Addendum)
Cardiology Consultation:   Patient ID: Valerie Mathis; [id number]; 10-Aug-1985   Admit date: 09/12/2017 Date of Consult: 09/12/2017  Primary Care Provider: No primary care provider on file. Primary Cardiologist: No primary care provider on file. NEw Primary Electrophysiologist:  n/a   Patient Profile:   Valerie Mathis is a 32 y.o. female with a hx of surgical repair of type A aortic dissection who is being seen today for TEE evaluation of chest/back pain at the request of Dr. Wilson Singer.  History of Present Illness:   Valerie Mathis does not have a lot of detailed information about her medical problems, but she had open heart surgery for a problem that "popped to the main artery coming off my heart" in [place], [place] roughly 1 year ago.  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  She has moved to [place].  She reports compliance with her medications, although she has difficulty recalling exactly which medications she supposed to take.  She presents with roughly 2 days of worsening pain that is located in her epigastric area and throughout the lower half of her thoracic spine area.  The pain intensified suddenly last night.  She denies fever, chills, cough, hemoptysis, diaphoresis, loss of sensation or pain in her limbs or frank abdominal pain.  She has not had melena, hematochezia or hematemesis.  Headache or shortness of breath.  She has not had palpitations or syncope. She does not have hematuria and believes that it is not possible that she could be pregnant.  Possible progression of aortic dissection is suspected, but she is not a good candidate for CT or MRI imaging procedures due to a creatinine greater than 4.  Baseline renal function is not known.  She has never had a discussion about possible progression of kidney disease and need for hemodialysis.  Past Medical History:  Diagnosis Date  . CHF (congestive heart failure) ([place])   . H/O: alcohol  abuse   . Hypertension   . Renal disorder    Surgical history: Open chest surgery, presumably repair of type a thoracic aortic dissection in 2018 in [place] Medications:  Prior to Admission medications   Not on File  She believes that her medications include carvedilol and the second medication that she was taking 3 times a day.  She was on clonidine in the past but this was stopped, because she no longer needs it".  She does not have recollection of her other medications.  Inpatient Medications: Scheduled Meds:  Continuous Infusions: . esmolol 125 mcg/kg/min (09/12/17 1615)   PRN Meds:   Allergies:   No Known Allergies  Social History:   Social History   Socioeconomic History  . Marital status: Single    Spouse name: Not on file  . Number of children: Not on file  . Years of education: Not on file  . Highest education level: Not on file  Occupational History  . Not on file  Social Needs  . Financial resource strain: Not on file  . Food insecurity:    Worry: Not on file    Inability: Not on file  . Transportation needs:    Medical: Not on file    Non-medical: Not on file  Tobacco Use  . Smoking status: Not on file  Substance and Sexual Activity  . Alcohol use: Not on file  . Drug use: Not on file  . Sexual activity: Not on file  Lifestyle  . Physical activity:  Days per week: Not on file    Minutes per session: Not on file  . Stress: Not on file  Relationships  . Social connections:    Talks on phone: Not on file    Gets together: Not on file    Attends religious service: Not on file    Active member of club or organization: Not on file    Attends meetings of clubs or organizations: Not on file    Relationship status: Not on file  . Intimate partner violence:    Fear of current or ex partner: Not on file    Emotionally abused: Not on file    Physically abused: Not on file    Forced sexual activity: Not on file  Other Topics Concern  . Not on  file  Social History Narrative  . Not on file    Family History:   Strong family history of severe hypertension.  No family history of thoracic or abdominal aortic aneurysms or end-stage renal disease   ROS:  Please see the history of present illness.   All other ROS reviewed and negative.     Physical Exam/Data:   Vitals:   09/12/17 1500 09/12/17 1533 09/12/17 1545 09/12/17 1557  BP: (!) 257/174 (!) 246/167 (!) 239/172 (!) 240/169  Pulse: (!) 112 (!) 110 (!) 119 (!) 109  Resp: (!) 23 (!) 24 (!) 27 (!) 23  Temp:      TempSrc:      SpO2: 100% 97% 96% 99%  Weight: 133 lb (60.3 kg)     Height: 5\' 3"  (1.6 m)      No intake or output data in the 24 hours ending 09/12/17 1623 Filed Weights   09/12/17 1500  Weight: 133 lb (60.3 kg)   Body mass index is 23.56 kg/m.  General:  Well nourished, well developed, in no acute distress. Lying fully supine in the exam bed without dyspnea HEENT: normal Lymph: no adenopathy Neck: no JVD Endocrine:  No thryomegaly Vascular: No carotid bruits; FA pulses 2+ bilaterally without bruits  Cardiac:  normal S1, Loud and resonant S2; RRR; no murmur Lungs:  clear to auscultation bilaterally, no wheezing, rhonchi or rales  Abd: soft, nontender, no hepatomegaly  Ext: no edema Musculoskeletal:  No deformities, BUE and BLE strength normal and equal Skin: warm and dry  Neuro:  CNs 2-12 intact, no focal abnormalities noted Psych:  Normal affect   EKG:  The EKG was personally reviewed and demonstrates:  Sinus tachycardia with LVH and prominent secondary repolarization abnormalities, probable biatrial enlargement Telemetry:  Telemetry was personally reviewed and demonstrates: Sinus rhythm   Laboratory Data:  Chemistry Recent Labs  Lab 09/12/17 1537 09/12/17 1538  NA  --  137  K  --  3.9  CL  --  109  GLUCOSE  --  101*  BUN  --  33*  CREATININE 4.40* 4.10*    No results for input(s): PROT, ALBUMIN, AST, ALT, ALKPHOS, BILITOT in the last 168  hours. Hematology Recent Labs  Lab 09/12/17 1538  HGB 12.6  HCT 37.0   Cardiac EnzymesNo results for input(s): TROPONINI in the last 168 hours.  Recent Labs  Lab 09/12/17 1536  TROPIPOC 0.04    BNPNo results for input(s): BNP, PROBNP in the last 168 hours.  DDimer No results for input(s): DDIMER in the last 168 hours.  Radiology/Studies:  No results found.  Assessment and Plan:   1. Hypertensive emergency: Esmolol has been insufficient to control her  blood pressure.  Nicardipine drip is being started.  We will give a dose of clonidine.  Part of the problem may be some degree of beta-blocker rebound since she has not received her medications for about 24 hours.  Presumably she was compliant with her medications before that, when her symptoms began.  This raises the possibility of progression of aortic dissection with involvement of one or both of her renal arteries, explaining both severe hypertension and renal insufficiency.  Control of her blood pressure is paramount and may be enough to control her pain symptoms. 2. History of type aortic dissection: The exact type of surgical repair performed is unknown.  We have requested records from [place].  TEE is indeed a reasonable avenue to evaluate for progression of the aortic dissection, although we will not be able to see the most distal portion of the ascending aorta.  We should be able to get a good evaluation of the proximal ascending aorta, most of the aortic arch and descending aorta.  The location of her pain suggest that the current source of her symptoms is in the distal thoracic aorta/proximal abdominal aorta.  Before we proceed with sedation for the TEE it would be important to at least partly control her severe hypertension. 3. Renal insufficiency: Chronicity of the abnormality and baseline renal function are not known at this time.  The TEE procedure with moderate sedation has been fully reviewed with the patient and written  informed consent has been obtained.    For questions or updates, please contact [place] Please consult [email or web address] for contact info under Cardiology/STEMI.   Signed, Sanda Klein, MD  09/12/2017 4:23 PM

## 2017-09-12 NOTE — ED Provider Notes (Signed)
Patient placed in Quick Look pathway, seen and evaluated   Chief Complaint: Back and abdominal pain  HPI:   Patient presents today for evaluation of back and abdominal pain.  She has a history of kidney failure and open heart surgery.  She is not sure if her open heart surgery was in May or September of last year, stating that she "popped the main artery coming off my heart."  She reports that she has been taking her blood pressure medicine however has not taken it today.  Her pain started 2 days ago, however became significantly worse last night into today.  She denies any urinary symptoms or possibility of pregnancy.  ROS: Back and abdominal pain  Physical Exam:   Gen: No distress  Neuro: Awake and Alert  Skin: Warm    Focused Exam: 2+ DP/PT pulses, Bilateral lower extremities are warm and well perfused.     Initiation of care has begun. The patient has been counseled on the process, plan, and necessity for staying for the completion/evaluation, and the remainder of the medical screening examination  Expressed concerns that patient is extremely hypertensive with very high suspicion for dissection.  It is not safe for patient to go back to waiting room.  Patient was brought back to C pod where I personally told Dr. Sherry Ruffing about the patient and immediately got him involved in her care.     Lorin Glass, PA-C 09/12/17 1516    Virgel Manifold, MD 09/12/17 [phone]

## 2017-09-12 NOTE — Progress Notes (Signed)
INDICATIONS: aortic dissection  PROCEDURE:   Informed consent was obtained prior to the procedure. The risks, benefits and alternatives for the procedure were discussed and the patient comprehended these risks.  Risks include, but are not limited to, cough, sore throat, vomiting, nausea, somnolence, esophageal and stomach trauma or perforation, bleeding, low blood pressure, aspiration, pneumonia, infection, trauma to the teeth and death.    After a procedural time-out, the oropharynx was anesthetized with 20% benzocaine spray.   During this procedure the patient was administered a total of Versed 4 mg and Fentanyl 25 mcg to achieve and maintain moderate conscious sedation.  The patient's heart rate, blood pressure, and oxygen saturationweare monitored continuously during the procedure. The period of conscious sedation was 11 minutes, of which I was present face-to-face 100% of this time.  The transesophageal probe was inserted in the esophagus and stomach without difficulty and multiple views were obtained.  The patient was kept under observation until the patient left the procedure room.  The patient left the procedure room in stable condition.   Agitated microbubble saline contrast was not administered.  COMPLICATIONS:    There were no immediate complications.  FINDINGS:   The aortic root is normal.   There is a graft repair of the ascending aorta and proximal aortic arch. Both the proximal and the distal graft anastomosis appear to be intact.  There is (probably chronic) dissection of the proximal portion of the descending aorta from the distal graft anastomosis to 25 cm from the incisors. An intimal tear is readily identified. The true and false lumen are roughly equal in diameter and the false lumen is distally thrombosed. The aortic diameter is fairly normal (28 mm) in this area. Beyond the area of dissection there is relatively healthy aorta up to 33 cm from the incisors.  At 33 cm  from the incisors, there is an area of ulcerated intramural hematoma, relatively short in extent (approximately 2 cm). This may be a new abnormality and responsible for the current symptoms.  Beyond the intramural hematoma, the descending aorta can be followed to the celiac and SMA arteries and appears to be normal. The renal arteries could not be visualized.  Severe left ventricular hypertrophy. Mildly depressed LV systolic function (EF 48-01%), in the setting of severe HTN (210/160 mm Hg). EF may be better during normal loading conditions. Pseudonormal mitral filling, elevated mean left atrial pressure (again, in setting of severely increased afterload). Normal aortic root and trileaflet aortic valve (trace aortic insufficiency). Normal mitral valve. The left atrium is probably mildly dilated. 2+ TR. Estimated systolic PA pressure is 65-53 mm Hg.   RECOMMENDATIONS:     Aggressive control of BP, with preferential use of beta blockers.  Time Spent Directly with the Patient:  30 minutes   Jaysha Lasure 09/12/2017, 6:10 PM

## 2017-09-12 NOTE — Consult Note (Addendum)
[address]       [place] [address]             [phone]          CARDIOTHORACIC SURGERY CONSULTATION REPORT  PCP is Patient, No Pcp Per Referring Provider is Virgel Manifold, MD   Reason for consultation:  Chronic aortic dissection with hypertensive crisis and back pain  HPI:  Patient is a 32 year old African-American female with history of long-standing hypertension and chronic kidney disease who underwent emergency repair of acute type a aortic dissection at [facility] in 2018 and presented acutely to the emergency department today with severe back pain associated with hypertensive crisis.  TEE was performed to evaluate the patient's thoracic aorta and vascular surgery was consulted.  Cardiothoracic surgical consultation was recommended.   Details of the patient's surgical procedure in 2018 are not currently available.  The patient states that she recovered from the surgery fairly well and has been followed in a free medical clinic in [facility].  Medical therapy for hypertension she claims to be compliant with although she reports that she did not take her medications this morning.  She moved to [place] less than 2 weeks ago where she now lives with her sister and 2 children.  She describes some history of chronic back pain but developed severe pain early this morning which prompted presentation to the emergency department.  She denies any chest pain or shortness of breath.  She denies any abdominal pain.  She denies any visual symptoms or other potential lateralizing neurologic symptoms.  Back pain has improved since presentation to the emergency department.  The remainder of the patient's past medical history is noncontributory.  The patient has a limited understanding of her chronic medical problems and her chronically dissected thoracic aorta.   Past Medical History:  Diagnosis Date  . CHF (congestive heart failure) ([place])    . CKD (chronic kidney disease) stage 4, GFR 15-29 ml/min ([facility])   . H/O: alcohol abuse   . Hypertension   . Renal disorder   . 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 REPAIR OF ACUTE ASCENDING THORACIC AORTIC DISSECTION      History reviewed. No pertinent family history.  Social History   Socioeconomic History  . Marital status: Single    Spouse name: Not on file  . Number of children: Not on file  . Years of education: Not on file  . Highest education level: Not on file  Occupational History  . Not on file  Social Needs  . Financial resource strain: Not on file  . Food insecurity:    Worry: Not on file    Inability: Not on file  . Transportation needs:    Medical: Not on file    Non-medical: Not on file  Tobacco Use  . Smoking status: Current Every Day Smoker  . Smokeless tobacco: Never Used  Substance and Sexual Activity  . Alcohol use: Not on file  . Drug use: Not on file  . Sexual activity: Not on file  Lifestyle  . Physical activity:    Days per week: Not on file    Minutes per session: Not on file  . Stress: Not on file  Relationships  . Social connections:    Talks on phone: Not on file    Gets together: Not on file    Attends religious service:  Not on file    Active member of club or organization: Not on file    Attends meetings of clubs or organizations: Not on file    Relationship status: Not on file  . Intimate partner violence:    Fear of current or ex partner: Not on file    Emotionally abused: Not on file    Physically abused: Not on file    Forced sexual activity: Not on file  Other Topics Concern  . Not on file  Social History Narrative  . Not on file    Prior to Admission medications   Medication Sig Start Date End Date Taking? Authorizing Provider  amLODipine (NORVASC) 10 MG tablet Take 10 mg by mouth daily. for high blood pressure 07/21/17   [provider]    carvedilol (COREG) 25 MG tablet Take 25 mg by mouth 2 (two) times daily. For blood pressure/heart 07/21/17   [provider]  cloNIDine (CATAPRES) 0.3 MG tablet Take 0.3 mg by mouth 3 (three) times daily. For high blood pressure 07/21/17   [provider]  furosemide (LASIX) 20 MG tablet Take 20 mg by mouth 2 (two) times daily. For high blood pressure/fluid 07/21/17   [provider]  hydrALAZINE (APRESOLINE) 25 MG tablet Take 25 mg by mouth 3 (three) times daily. For high blood pressure 07/21/17   [provider]    Current Facility-Administered Medications  Medication Dose Route Frequency Provider Last Rate Last Dose  . 0.9 %  sodium chloride infusion   Intravenous Continuous Croitoru, Mihai, MD      . acetaminophen (TYLENOL) tablet 650 mg  650 mg Oral Q6H PRN Jani Gravel, MD       Or  . acetaminophen (TYLENOL) suppository 650 mg  650 mg Rectal Q6H PRN Jani Gravel, MD      . esmolol (BREVIBLOC) 2000 mg / 100 mL (20 mg/mL) infusion  25-300 mcg/kg/min Intravenous Titrated Jani Gravel, MD       Current Outpatient Medications  Medication Sig Dispense Refill  . amLODipine (NORVASC) 10 MG tablet Take 10 mg by mouth daily. for high blood pressure  1  . carvedilol (COREG) 25 MG tablet Take 25 mg by mouth 2 (two) times daily. For blood pressure/heart  1  . cloNIDine (CATAPRES) 0.3 MG tablet Take 0.3 mg by mouth 3 (three) times daily. For high blood pressure  1  . furosemide (LASIX) 20 MG tablet Take 20 mg by mouth 2 (two) times daily. For high blood pressure/fluid  1  . hydrALAZINE (APRESOLINE) 25 MG tablet Take 25 mg by mouth 3 (three) times daily. For high blood pressure  1    No Known Allergies    Review of Systems:   General:  normal appetite, normal energy, no weight gain, no weight loss, no fever  Cardiac:  no chest pain with exertion, no chest pain at rest, + SOB with exertion, no resting SOB, no PND, no orthopnea, no palpitations, no arrhythmia, no atrial  fibrillation, no LE edema, no dizzy spells, no syncope  Respiratory:  no shortness of breath, no home oxygen, no productive cough, no dry cough, no bronchitis, no wheezing, no hemoptysis, no asthma, no pain with inspiration or cough, no sleep apnea, no CPAP at night  GI:   no difficulty swallowing, no reflux, no frequent heartburn, no hiatal hernia, no abdominal pain, no constipation, no diarrhea, no hematochezia, no hematemesis, no melena  GU:   no dysuria,  no frequency, no urinary tract infection,  no hematuria, no kidney stones, + known chronic kidney disease  Vascular:  no pain suggestive of claudication, no pain in feet, no leg cramps, no varicose veins, no DVT, no non-healing foot ulcer  Neuro:   no stroke, no TIA's, no seizures, no headaches, no temporary blindness one eye,  no slurred speech, no peripheral neuropathy, no chronic pain, no instability of gait, no memory/cognitive dysfunction  Musculoskeletal: no arthritis , no joint swelling, no myalgias, no difficulty walking, normal mobility   Skin:   no rash, no itching, no skin infections, no pressure sores or ulcerations  Psych:   no anxiety, no depression, no nervousness, no unusual recent stress  Eyes:   no blurry vision, no floaters, no recent vision changes, does not wear glasses or contacts  ENT:   no hearing loss, no loose or painful teeth, no dentures  Hematologic:  no easy bruising, no abnormal bleeding, no clotting disorder, no frequent epistaxis  Endocrine:  no diabetes, does not check CBG's at home     Physical Exam:   BP 134/84   Pulse 87   Temp 99.4 F (37.4 C) (Oral)   Resp 18   Ht 5\' 3"  (1.6 m)   Wt 133 lb (60.3 kg)   LMP 07/15/2017 (Approximate)   SpO2 100%   BMI 23.56 kg/m   General:  Thin,  well-appearing  HEENT:  Unremarkable   Neck:   no JVD, no bruits, no adenopathy   Chest:   clear to auscultation, symmetrical breath sounds, no wheezes, no rhonchi   CV:   RRR, no  murmur   Abdomen:  soft, non-tender,  no masses   Extremities:  warm, well-perfused, pulses palpable, no lower extremity edema  Rectal/GU  Deferred  Neuro:   Grossly non-focal and symmetrical throughout  Skin:   Clean and dry, no rashes, no breakdown  Diagnostic Tests:  Lab Results: Recent Labs    09/12/17 1538  HGB 12.6  HCT 37.0   BMET:  Recent Labs    09/12/17 1531 09/12/17 1537 09/12/17 1538  NA 137  --  137  K 3.8  --  3.9  CL 107  --  109  CO2 18*  --   --   GLUCOSE 102*  --  101*  BUN 29*  --  33*  CREATININE 4.23* 4.40* 4.10*  CALCIUM 8.9  --   --     CBG (last 3)  No results for input(s): GLUCAP in the last 72 hours. PT/INR:  No results for input(s): LABPROT, INR in the last 72 hours.  CXR:  N/A  COMPLETE ABDOMINAL ULTRASOUND  COMPARISON:  None available  FINDINGS: Gallbladder: Wall thickening up to 10 mm. Stones in the dependent aspect of the gallbladder measuring up to 7 mm. Trace pericholecystic fluid. The gallbladder is incompletely distended.  Common bile duct:  Normal in caliber, 1.14mm diameter.  Liver: Homogeneous in echotexture without focal lesion or intrahepatic bile duct dilatation.  IVC:  Negative  Pancreas:  Negative  Spleen:  No focal lesion, craniocaudal 9cm in length.  Right Kidney: No mass or hydronephrosis, echogenic compared to the adjacent liver. 9.2cm in length.  Left Kidney:  No lesion or hydronephrosis, 9.8cm in length.  Abdominal aorta: Fusiform aneurysm measured up to 3.2 cm in the proximal segment.  Trace perisplenic ascites.  IMPRESSION: 1. Cholelithiasis with thick walled nondistended gallbladder, suggestive of chronic cholecystitis. 2. Echogenic renal parenchyma, a nonspecific finding often associated with medical renal disease. No hydronephrosis. 3. Trace abdominal  ascites. 4. 3.2 cm abdominal aortic aneurysm. Recommend followup by ultrasound in 3 years. This recommendation follows ACR consensus guidelines: White Paper of the ACR  Incidental Findings Committee II on Vascular Findings. [name] Coll Radiol 2013; 10:789-794   Electronically Signed   By: Lucrezia Europe M.D.   On: 09/12/2017 17:35   Transesophageal Echocardiography  Patient:    Avrie, Kedzierski MR #:       [id number] Study Date: 09/12/2017 Gender:     F Age:        33 Height:     160 cm Weight:     60.3 kg BSA:        1.63 m^2 Pt. Status: Room:   ORDERING     Sanda Klein, MD  REFERRING    Sanda Klein, MD  English, [place]  SONOGRAPHER  Dance, Tiffany  cc:  -------------------------------------------------------------------  ------------------------------------------------------------------- Indications:     Aortic dissection  ------------------------------------------------------------------- Study Conclusions  - Aortic valve: There was trivial regurgitation. - Aortic root: The aortic root was well visualized and normal in   size. - Ascending aorta: S/P graft repair (graft diameter 29 mm). Maximum   ascending aorta diameter 40.5 mm. Intact proximal graft   anastomosis. - Aortic arch: The proximal arch and the graft anastomosis are not   well visualized, but the anastomosis appears to be intact.   The distal arch has a (probably chronic) dissection and is mildly   dilated, maximum diameter 40 mm. The left subclavian artery   appears to originate from the true lumen. - Descending aorta: The proximal descending aorta up to 25 cm from   the incisors shows chronic dissection with a well defined intimal   tear, dual lumen, partially thrombosed false lumen. The maximum   diameter is 40 mm.     The mid descending aorta (25-33 cm from the incisors) appears   healthy but mildly dilated, diameter 35 mm.     The distal descending aort has an intramural hematoma, which is   partially ucerated. The aortic diameter at this location is 30   mm. The maximum hematoma thickness is 8 mm. The craniocaudal   length of the  hematoma is approximately 20 mm. - Left atrium: The atrium was dilated. No evidence of thrombus in   the atrial cavity or appendage. - Right atrium: No evidence of thrombus in the atrial cavity or   appendage. - Atrial septum: No defect or patent foramen ovale was identified. - Tricuspid valve: There was mild-moderate regurgitation directed   centrally. - Pulmonary arteries: Systolic pressure was moderately increased.   PA peak pressure: 55 mm Hg (S).  Impressions:  - Severe hypertensive cardiomyopathy/LVH. Mildly depressed LVEF,   possibly due to severe afterload increase (BP 210/140 at onset of   study).     Moderate pulmonary artery hypertension, likely secondary to left   heart failure.   .   Findings of old Type A aortic dissection with previous surgical   graft repair of the ascending aorta and hemiarch, but with   persistent dissection of the distal aortic arch and proximal   descending thoracic aorta, up to 25 cm from the incisors. These   are probably chronic abnormalities.     Small intramural hematoma with ulceration, but without frank   dissection, in the distal descending thoracic aorta. This may be   a new, acute abnormality. Mildly dilated aortic arch and   descending thoracic aorta.  ------------------------------------------------------------------- Study data:  Procedure:  Initial setup. The patient was brought to the laboratory. Surface ECG leads were monitored. Sedation. Conscious sedation was administered. Transesophageal echocardiography. Topical anesthesia was obtained using viscous lidocaine. A transesophageal probe was inserted by the attending cardiologist. Image quality was adequate.  Study completion:  The patient tolerated the procedure well. There were no complications.         Diagnostic transesophageal echocardiography.  2D and color Doppler.  Birthdate:  Patient birthdate: 10-31-1985.  Age:  Patient is 32 yr old.  Sex:  Gender: female.     BMI: 23.6 kg/m^2.  Study date:  Study date: 09/12/2017. Study time: 05:42 PM.  -------------------------------------------------------------------  ------------------------------------------------------------------- Aortic valve:   Structurally normal valve. Trileaflet. Cusp separation was normal.  Doppler:  There was trivial regurgitation.   ------------------------------------------------------------------- Aorta:  Aortic root: The aortic root was well visualized and normal in size. Ascending aorta: S/P graft repair (graft diameter 29 mm). Maximum ascending aorta diameter 40.5 mm. Intact proximal graft anastomosis. Aortic arch: The proximal arch and the graft anastomosis are not well visualized, but the anastomosis appears to be intact. The distal arch has a (probably chronic) dissection and is mildly dilated, maximum diameter 40 mm. The left subclavian artery appears to originate from the true lumen. Descending aorta: The descending aorta was well visualized, mildly dilated, and not calcified. The proximal descending aorta up to 25 cm from the incisors shows chronic dissection with a well defined intimal tear, dual lumen, partially thrombosed false lumen. The maximum diameter is 40 mm.  The mid descending aorta (25-33 cm from the incisors) appears healthy but mildly dilated, diameter 35 mm.  The distal descending aort has an intramural hematoma, which is partially ucerated. The aortic diameter at this location is 30 mm. The maximum hematoma thickness is 8 mm. The craniocaudal length of the hematoma is approximately 20 mm.  ------------------------------------------------------------------- Mitral valve:   Structurally normal valve.   Leaflet separation was normal.  No evidence of vegetation.  Doppler:  There was no regurgitation.  ------------------------------------------------------------------- Left atrium:  The atrium was dilated.  No evidence of thrombus in the  atrial cavity or appendage. The appendage was of normal size. Emptying velocity was normal.  ------------------------------------------------------------------- Atrial septum:  No defect or patent foramen ovale was identified.   ------------------------------------------------------------------- Pulmonic valve:    Structurally normal valve.   Cusp separation was normal.  No evidence of vegetation.  Doppler:  There was mild regurgitation.  ------------------------------------------------------------------- Tricuspid valve:   Structurally normal valve.   Leaflet separation was normal.  Doppler:  There was mild-moderate regurgitation directed centrally.  ------------------------------------------------------------------- Pulmonary artery:   The main pulmonary artery was normal-sized. Systolic pressure was moderately increased.  ------------------------------------------------------------------- Right atrium:  The atrium was normal in size.  No evidence of thrombus in the atrial cavity or appendage.  ------------------------------------------------------------------- Measurements   Ventricular septum                      Value       Reference  IVS thickness, ED                       20.26 mm    ---------    Aortic valve                            Value       Reference  Aortic annulus diameter, ED  19.92 mm    14 - 26    Aorta                                   Value       Reference  Aortic root ID, M-L, ED                 30.37 mm    ---------  Aortic root ID, STJ, ED                 23.45 mm    ---------  Ascending aorta ID, A-P, mid, ED (H)    34.84 mm    21 - 34  Descending aorta ID, distal             30.08 mm    ---------    Pulmonary arteries                      Value       Reference  PA pressure, S, DP               (H)    55    mm Hg <=30    Tricuspid valve                         Value       Reference  Tricuspid regurg peak velocity          359   cm/s   ---------  Tricuspid peak RV-RA gradient           52    mm Hg ---------    Systemic veins                          Value       Reference  Estimated CVP                           3     mm Hg ---------    Right ventricle                         Value       Reference  RV pressure, S, DP               (H)    55    mm Hg <=30  Legend: (L)  and  (H)  mark values outside specified reference range.  ------------------------------------------------------------------- Prepared and Electronically Authenticated by  Sanda Klein, MD 2019-04-29T18:59:04   Impression:  Patient underwent emergent repair of acute type a aortic dissection in 2018 and has chronic dissection of the transverse aortic arch and the entire descending thoracic aorta.  I have personally reviewed the TEE performed today which demonstrates normal appearance of the ascending thoracic aortic graft and proximal aortic root.  The aortic valve is functioning normally.  The transverse aortic arch and descending thoracic aorta is chronically dissected.  There is only mild dilatation of the chronic dissection.  There are no obvious complicating features, but more accurate assessment would require CT angiography with intravenous contrast in comparison with previous studies.  At present there are no clear findings to suggest the development of complications that might require surgical intervention and the mainstay of therapy should be aggressive control of blood  pressure.  Moreover, the majority of complications which might develop in the setting of chronic dissection involving the descending thoracic aorta are typically managed using endovascular techniques and are usually managed locally by the vascular surgery team.  The patient's long-term prognosis remains clouded by her underlying disease process and the chronic aortic dissection and will be dramatically affected by whether or not she has consistent medical care with close follow-up.       Plan:  I recommend hospital admission with aggressive medical therapy to control the patient's blood pressure, potentially requiring admission to the intensive care unit for invasive monitoring.  We will defer any subsequent plans for invasive imaging and long-term surgical follow-up to the vascular surgery team.  It might be wise to have her be seen in consultation by Nephrology and Cardiology while she is here to establish plans for long term medical management.  Please call if we can be of further assistance.   I spent in excess of 60 minutes during the conduct of this hospital consultation and >50% of this time involved direct face-to-face encounter for counseling and/or coordination of the patient's care.    Valentina Gu. Roxy Manns, MD 09/12/2017 8:16 PM

## 2017-09-12 NOTE — Progress Notes (Signed)
  Echocardiogram Echocardiogram Transesophageal has been performed.  Valerie Mathis 09/12/2017, 6:42 PM

## 2017-09-12 NOTE — ED Triage Notes (Signed)
Pt presents for evaluation of lower bilateral back pain. Pt reports hx of kidney failure and open heart sx. Pt reports pain started 2 days ago. Denies urinary symptoms.

## 2017-09-12 NOTE — ED Provider Notes (Addendum)
[facility] EMERGENCY DEPARTMENT Provider Note   CSN: [id number] Arrival date & time: 09/12/17  1333     History   Chief Complaint Chief Complaint  Patient presents with  . Back Pain    HPI Valerie Mathis is a 32 y.o. female.  HPI   81yF with mid/lower back pain and abdominal pain. Abdominal pain is periumbilical to epigastric. Began about two days ago. Progressive and worse today. Constant. No appreciable exacerbating or relieving factors. No urinary complaints. No n/v. Denies CP or dyspnea. Very hypertensive. She did not take her meds today, but says otherwise compliant. Reports surgery to "the main artery from my heart" in [place] last year but unsure of specifics beyond this. Also reports history of "kidney failure."   Past Medical History:  Diagnosis Date  . CHF (congestive heart failure) ([place])   . H/O: alcohol abuse   . Hypertension   . Renal disorder     There are no active problems to display for this patient.   History reviewed. No pertinent surgical history.   OB History   None      Home Medications    Prior to Admission medications   Not on File    Family History No family history on file.  Social History Social History   Tobacco Use  . Smoking status: Not on file  Substance Use Topics  . Alcohol use: Not on file  . Drug use: Not on file     Allergies   Patient has no known allergies.   Review of Systems Review of Systems  All systems reviewed and negative, other than as noted in HPI.  Physical Exam Updated Vital Signs BP (!) 246/167   Pulse (!) 110   Temp 99.4 F (37.4 C) (Oral)   Resp (!) 24   Ht 5\' 3"  (1.6 m)   Wt 60.3 kg (133 lb)   LMP 07/15/2017 (Approximate)   SpO2 97%   BMI 23.56 kg/m   Physical Exam  Constitutional: She appears well-developed and well-nourished. No distress.  HENT:  Head: Normocephalic and atraumatic.  Eyes: Conjunctivae are normal. Right eye exhibits no discharge. Left eye  exhibits no discharge.  Neck: Neck supple.  Cardiovascular: Regular rhythm and normal heart sounds. Exam reveals no gallop and no friction rub.  No murmur heard. Tachycardic. No murmur. Brachial and femoral pulses feel symmetric. Sternotomy scar.   Pulmonary/Chest: Effort normal and breath sounds normal. No respiratory distress.  Abdominal: Soft. She exhibits no distension. There is no tenderness.  I can feel abdominal aortic pulsations but cannot say I definitely feel it is enlarged. Small laparoscopy scars. Mild epigastric tenderness. Non-distended.   Musculoskeletal: She exhibits no edema or tenderness.  Neurological: She is alert.  Skin: Skin is warm and dry.  Psychiatric: She has a normal mood and affect. Her behavior is normal. Thought content normal.  Nursing note and vitals reviewed.     ED Treatments / Results  Labs (all labs ordered are listed, but only abnormal results are displayed) Labs Reviewed  COMPREHENSIVE METABOLIC PANEL - Abnormal; Notable for the following components:      Result Value   CO2 18 (*)    Glucose, Bld 102 (*)    BUN 29 (*)    Creatinine, Ser 4.23 (*)    Albumin 3.4 (*)    ALT 10 (*)    Alkaline Phosphatase 187 (*)    GFR calc non Af Amer 13 (*)    GFR calc  Af Amer 15 (*)    All other components within normal limits  BRAIN NATRIURETIC PEPTIDE - Abnormal; Notable for the following components:   B Natriuretic Peptide >4,500.0 (*)    All other components within normal limits  I-STAT CREATININE, ED - Abnormal; Notable for the following components:   Creatinine, Ser 4.40 (*)    All other components within normal limits  I-STAT CHEM 8, ED - Abnormal; Notable for the following components:   BUN 33 (*)    Creatinine, Ser 4.10 (*)    Glucose, Bld 101 (*)    Calcium, Ion 1.10 (*)    TCO2 18 (*)    All other components within normal limits  CBC WITH DIFFERENTIAL/PLATELET  URINALYSIS, ROUTINE W REFLEX MICROSCOPIC  RAPID URINE DRUG SCREEN, HOSP  PERFORMED  I-STAT CG4 LACTIC ACID, ED  I-STAT BETA HCG BLOOD, ED ([facility], [facility], AP ONLY)  I-STAT TROPONIN, ED  I-STAT CG4 LACTIC ACID, ED  TYPE AND SCREEN    EKG EKG Interpretation  Date/Time:  Monday September 12 2017 14:58:07 EDT Ventricular Rate:  113 PR Interval:    QRS Duration: 73 QT Interval:  334 QTC Calculation: 458 R Axis:   -23 Text Interpretation:  Sinus tachycardia Biatrial enlargement LVH with secondary repolarization abnormality No old tracing to compare Confirmed by Virgel Manifold [phone]) on 09/12/2017 4:15:05 PM   Radiology US Abdomen Complete  Result Date: 09/12/2017 CLINICAL DATA:  Abdominal and back pain. Renal disorder, hypertension. EXAM: COMPLETE ABDOMINAL ULTRASOUND COMPARISON:  None available FINDINGS: Gallbladder: Wall thickening up to 10 mm. Stones in the dependent aspect of the gallbladder measuring up to 7 mm. Trace pericholecystic fluid. The gallbladder is incompletely distended. Common bile duct:  Normal in caliber, 1.87mm diameter. Liver: Homogeneous in echotexture without focal lesion or intrahepatic bile duct dilatation. IVC:  Negative Pancreas:  Negative Spleen:  No focal lesion, craniocaudal 9cm in length. Right Kidney: No mass or hydronephrosis, echogenic compared to the adjacent liver. 9.2cm in length. Left Kidney:  No lesion or hydronephrosis, 9.8cm in length. Abdominal aorta: Fusiform aneurysm measured up to 3.2 cm in the proximal segment. Trace perisplenic ascites. IMPRESSION: 1. Cholelithiasis with thick walled nondistended gallbladder, suggestive of chronic cholecystitis. 2. Echogenic renal parenchyma, a nonspecific finding often associated with medical renal disease. No hydronephrosis. 3. Trace abdominal ascites. 4. 3.2 cm abdominal aortic aneurysm. Recommend followup by ultrasound in 3 years. This recommendation follows ACR consensus guidelines: White Paper of the ACR Incidental Findings Committee II on Vascular Findings. [name] Radiol 2013; 10:789-794  Electronically Signed   By: Lucrezia Europe M.D.   On: 09/12/2017 17:35    Procedures Procedures (including critical care time)  Angiocath insertion Performed by: Virgel Manifold  Consent: Verbal consent obtained. Risks and benefits: risks, benefits and alternatives were discussed Time out: Immediately prior to procedure a "time out" was called to verify the correct patient, procedure, equipment, support staff and site/side marked as required.  Preparation: Patient was prepped and draped in the usual sterile fashion.  Vein Location: R AC  Ultrasound Guided  Gauge: 20 g  Flash of blood in catheter, could not draw additional blood though.  Flushed without difficulty.  Patient tolerance: Patient tolerated the procedure well with no immediate complications.  CRITICAL CARE Performed by: Virgel Manifold Total critical care time: 40 minutes Critical care time was exclusive of separately billable procedures and treating other patients. Critical care was necessary to treat or prevent imminent or life-threatening deterioration. Critical care was time spent personally  by me on the following activities: development of treatment plan with patient and/or surrogate as well as nursing, discussions with consultants, evaluation of patient's response to treatment, examination of patient, obtaining history from patient or surrogate, ordering and performing treatments and interventions, ordering and review of laboratory studies, ordering and review of radiographic studies, pulse oximetry and re-evaluation of patient's condition.     Medications Ordered in ED Medications  esmolol (BREVIBLOC) 2000 mg / 100 mL (20 mg/mL) infusion (125 mcg/kg/min  60.3 kg Intravenous Rate/Dose Change 09/12/17 1615)  cloNIDine (CATAPRES) tablet 0.1 mg (has no administration in time range)  esmolol (BREVIBLOC) bolus via infusion 30,150 mcg (30,150 mcg Intravenous Bolus from Bag 09/12/17 1554)  nicardipine (CARDENE) 20mg  in 0.86%  saline 211ml IV infusion (0.1 mg/ml) (5 mg/hr Intravenous New Bag/Given 09/12/17 1617)     Initial Impression / Assessment and Plan / ED Course  I have reviewed the triage vital signs and the nursing notes.  Pertinent labs & imaging results that were available during my care of the patient were reviewed by me and considered in my medical decision making (see chart for details).     31yF with mid/lower back and abdominal pain. Dissection is most pressing diagnosis which needs to be ruled out. She describes history for what sounds like possibly aortic dissection and repair? She does have a sternotomy scar. Trying to obtain records for [facility] in [place], [place] for specifics.   Her Cr is 4.4. Looks like baseline is ~3.  Cannot obtain CTa. Will obtain abdominal US and discuss with cardiology for possible TEE.   Extremely hypertensive and mild tachycardia. Needs control. Will start esmolol.    4:12 PM  Pt remains extremely hypertensive after initiation of esmolol. Will add cardene. Dr Sallyanne Kuster at bedside discussing TEE with pt.   6:28 PM Partial records from [place]. ECHO on 02/18/17 showing ascending aorta replaced from stj to aortic arch. Proximal descending aorta with visible flap consistent with dissection which was also noted on previous ECHO from 10/27/16.   ECHO today noting intramural hematoma in descending aorta ~8 cm distal to chronic findings. This is not mentioned on prior reports and could be etiology of current symptoms.  Dr Croitoru's help is much appreciated. BP now much better. Will discuss with vascular surgery. Medicine admission for ongoing management of hypertensive emergency.   7:29 PM Briefly discussed with Dr Donnetta Hutching, vascular surgery. No emergent intervention at this time. Medical management of blood pressure. Although more acute issue seems to in in descending aorta, with her prior ascending aorta surgery, he recommended speaking with cardiothoracic surgery. CTS paged.    7:57 PM Discussed with Dr Roxy Manns, cardiothoracic surgery. Had same recommendations. Will see her non-emergently.     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

## 2017-09-12 NOTE — ED Notes (Signed)
Pt to Ultrasound

## 2017-09-13 ENCOUNTER — Encounter (HOSPITAL_COMMUNITY): Payer: Self-pay | Admitting: General Practice

## 2017-09-13 ENCOUNTER — Inpatient Hospital Stay (HOSPITAL_COMMUNITY): Payer: Medicaid Other

## 2017-09-13 DIAGNOSIS — I7101 Dissection of thoracic aorta: Secondary | ICD-10-CM | POA: Diagnosis present

## 2017-09-13 DIAGNOSIS — F1490 Cocaine use, unspecified, uncomplicated: Secondary | ICD-10-CM | POA: Insufficient documentation

## 2017-09-13 DIAGNOSIS — I7103 Dissection of thoracoabdominal aorta: Secondary | ICD-10-CM

## 2017-09-13 DIAGNOSIS — I7100 Dissection of unspecified site of aorta: Secondary | ICD-10-CM

## 2017-09-13 DIAGNOSIS — I71019 Dissection of thoracic aorta, unspecified: Secondary | ICD-10-CM | POA: Diagnosis present

## 2017-09-13 DIAGNOSIS — I119 Hypertensive heart disease without heart failure: Secondary | ICD-10-CM

## 2017-09-13 DIAGNOSIS — N189 Chronic kidney disease, unspecified: Secondary | ICD-10-CM

## 2017-09-13 LAB — URINALYSIS, ROUTINE W REFLEX MICROSCOPIC
Bilirubin Urine: NEGATIVE
Glucose, UA: NEGATIVE mg/dL
KETONES UR: NEGATIVE mg/dL
NITRITE: NEGATIVE
PH: 5 (ref 5.0–8.0)
Protein, ur: >=300 mg/dL — AB
Specific Gravity, Urine: 1.014 (ref 1.005–1.030)

## 2017-09-13 LAB — RAPID URINE DRUG SCREEN, HOSP PERFORMED
Amphetamines: NOT DETECTED
BARBITURATES: NOT DETECTED
Benzodiazepines: POSITIVE — AB
Cocaine: POSITIVE — AB
Opiates: NOT DETECTED
Tetrahydrocannabinol: NOT DETECTED

## 2017-09-13 LAB — IRON AND TIBC
Iron: 14 ug/dL — ABNORMAL LOW (ref 28–170)
Saturation Ratios: 5 % — ABNORMAL LOW (ref 10.4–31.8)
TIBC: 297 ug/dL (ref 250–450)
UIBC: 283 ug/dL

## 2017-09-13 LAB — COMPREHENSIVE METABOLIC PANEL
ALBUMIN: 2.7 g/dL — AB (ref 3.5–5.0)
ALT: 8 U/L — AB (ref 14–54)
AST: 10 U/L — ABNORMAL LOW (ref 15–41)
Alkaline Phosphatase: 147 U/L — ABNORMAL HIGH (ref 38–126)
Anion gap: 10 (ref 5–15)
BILIRUBIN TOTAL: 0.7 mg/dL (ref 0.3–1.2)
BUN: 31 mg/dL — AB (ref 6–20)
CHLORIDE: 109 mmol/L (ref 101–111)
CO2: 17 mmol/L — ABNORMAL LOW (ref 22–32)
CREATININE: 4.43 mg/dL — AB (ref 0.44–1.00)
Calcium: 8 mg/dL — ABNORMAL LOW (ref 8.9–10.3)
GFR calc Af Amer: 14 mL/min — ABNORMAL LOW (ref >60)
GFR, EST NON AFRICAN AMERICAN: 12 mL/min — AB (ref >60)
GLUCOSE: 83 mg/dL (ref 65–99)
Potassium: 3.9 mmol/L (ref 3.5–5.1)
Sodium: 136 mmol/L (ref 135–145)
TOTAL PROTEIN: 5.5 g/dL — AB (ref 6.5–8.1)

## 2017-09-13 LAB — CBC
HCT: 29.4 % — ABNORMAL LOW (ref 36.0–46.0)
Hemoglobin: 9.5 g/dL — ABNORMAL LOW (ref 12.0–15.0)
MCH: 25.6 pg — ABNORMAL LOW (ref 26.0–34.0)
MCHC: 32.3 g/dL (ref 30.0–36.0)
MCV: 79.2 fL (ref 78.0–100.0)
PLATELETS: 236 10*3/uL (ref 150–400)
RBC: 3.71 MIL/uL — ABNORMAL LOW (ref 3.87–5.11)
RDW: 18.3 % — AB (ref 11.5–15.5)
WBC: 4.2 10*3/uL (ref 4.0–10.5)

## 2017-09-13 LAB — TROPONIN I
Troponin I: 0.05 ng/mL (ref <0.03)
Troponin I: 0.04 ng/mL (ref <0.03)

## 2017-09-13 LAB — LACTATE DEHYDROGENASE: LDH: 181 U/L (ref 98–192)

## 2017-09-13 LAB — HIV ANTIBODY (ROUTINE TESTING W REFLEX): HIV Screen 4th Generation wRfx: NONREACTIVE

## 2017-09-13 LAB — URIC ACID: Uric Acid, Serum: 10.3 mg/dL — ABNORMAL HIGH (ref 2.3–6.6)

## 2017-09-13 MED ORDER — LABETALOL HCL 5 MG/ML IV SOLN: 10.0000 mg | INTRAVENOUS | Status: DC | PRN

## 2017-09-13 NOTE — Progress Notes (Signed)
CSW received consult. CSW went to meet with pt in pt's room. Pt in the process of being moved to inpatient floor.   CSW will leave handoff for floor CSW.   Wendelyn Breslow, [facility]  [phone]

## 2017-09-13 NOTE — Progress Notes (Signed)
PROGRESS NOTE    Valerie Mathis  PFX:[id number] DOB: 1985/08/27 DOA: 09/12/2017 PCP: Patient, No Pcp Per     Brief Narrative:  Valerie Mathis is a 32 yo female with asked medical history of hypertension, systolic heart failure, chronic knee disease stage IV, history of aortic dissection who presents with abdominal pain, back pain.  When she presented to the emergency department, patient blood pressure was elevated 250/177.  Due to her chronic kidney disease, CTA chest could not be obtained.  Abdominal ultrasound and TEE was completed with cardiology and cardiothoracic surgery consultation.  EDP also discussed with Dr. Donnetta Hutching, vascular surgery.  No emergent intervention was recommended at this time.  Patient was started on esmolol drip for medical management.  Assessment & Plan:   Principal Problem:   Hypertensive emergency Active Problems:   Back pain   Tachycardia  Hypertensive emergency -Much better controlled this morning.  Esmolol drip has been turned off by cardiology. 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 Both the proximal and the distal graft anastomosis appear to be intact. 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 Also with ulcerated intramural hematoma.  -Cardiothoracic surgery consulted, recommend aggressive medical therapy to manage blood pressure  -Vascular surgery reviewed case and discussed with EDP, no emergent intervention recommended at this time  -Cardiology following -MRA chest, abdomen, pelvis without contrast ordered   Chronic kidney disease stage IV-V -In care everywhere, her baseline creatinine appears to be around 2.9 in 2018.  Patient states that she has never seen nephrology before.  Her kidney function on admission was  elevated at 4.43. -Consult nephrology, spoke with Dr. Jimmy Footman  Substance abuse -Positive UDS for cocaine and benzo -SW consult for resources   DVT prophylaxis: SCD Code Status: Full Family Communication: No family at bedside Disposition Plan: Pending improvement, MRA   Consultants:   Cardiothoracic surgery  Vascular surgery  Cardiology  Nephrology  Procedures:   TEE 4/29  Antimicrobials:  Anti-infectives (From admission, onward)   None        Subjective: Feeling well this morning.  Denies any chest pain, back pain, shortness of breath.  Blood pressure much better controlled.  Objective: Vitals:   09/13/17 1145 09/13/17 1215 09/13/17 1230 09/13/17 1300  BP: (!) 149/90 (!) 144/85 (!) 144/91 137/78  Pulse: 67 65 64 64  Resp: 16 12 15 15   Temp:      TempSrc:      SpO2: 99% 100% 99% 98%  Weight:      Height:        Intake/Output Summary (Last 24 hours) at 09/13/2017 1323 Last data filed at 09/13/2017 1101 Gross per 24 hour  Intake 426.95 ml  Output -  Net 426.95 ml   Filed Weights   09/12/17 1500  Weight: 60.3 kg (133 lb)    Examination:  General exam: Appears calm and comfortable  Respiratory system: Clear to auscultation. Respiratory effort normal. Cardiovascular system: S1 & S2 heard, RRR. No JVD, murmurs, rubs, gallops or clicks. No pedal edema. Gastrointestinal system: Abdomen is nondistended, soft and nontender. No organomegaly or masses felt. Normal bowel sounds heard. Central nervous system: Alert and oriented. No focal neurological deficits. Extremities: Symmetric 5 x 5 power. Skin: No rashes, lesions or ulcers Psychiatry: Judgement and insight appear normal. Mood & affect appropriate.   Data  Reviewed: I have personally reviewed following labs and imaging studies  CBC: Recent Labs  Lab 09/12/17 1538 09/13/17 0250  WBC  --  4.2  HGB 12.6 9.5*  HCT 37.0 29.4*  MCV  --  79.2  PLT  --  115   Basic Metabolic Panel: Recent Labs    Lab 09/12/17 1531 09/12/17 1537 09/12/17 1538 09/13/17 0250  NA 137  --  137 136  K 3.8  --  3.9 3.9  CL 107  --  109 109  CO2 18*  --   --  17*  GLUCOSE 102*  --  101* 83  BUN 29*  --  33* 31*  CREATININE 4.23* 4.40* 4.10* 4.43*  CALCIUM 8.9  --   --  8.0*   GFR: Estimated Creatinine Clearance: 15.5 mL/min (A) (by C-G formula based on SCr of 4.43 mg/dL (H)). Liver Function Tests: Recent Labs  Lab 09/12/17 1531 09/13/17 0250  AST 16 10*  ALT 10* 8*  ALKPHOS 187* 147*  BILITOT 0.8 0.7  PROT 6.9 5.5*  ALBUMIN 3.4* 2.7*   No results for input(s): LIPASE, AMYLASE in the last 168 hours. No results for input(s): AMMONIA in the last 168 hours. Coagulation Profile: No results for input(s): INR, PROTIME in the last 168 hours. Cardiac Enzymes: Recent Labs  Lab 09/13/17 0250  TROPONINI 0.05*   BNP (last 3 results) No results for input(s): PROBNP in the last 8760 hours. HbA1C: No results for input(s): HGBA1C in the last 72 hours. CBG: No results for input(s): GLUCAP in the last 168 hours. Lipid Profile: No results for input(s): CHOL, HDL, LDLCALC, TRIG, CHOLHDL, LDLDIRECT in the last 72 hours. Thyroid Function Tests: Recent Labs    09/12/17 1950  TSH 1.856   Anemia Panel: No results for input(s): VITAMINB12, FOLATE, FERRITIN, TIBC, IRON, RETICCTPCT in the last 72 hours. Sepsis Labs: Recent Labs  Lab 09/12/17 1539 09/12/17 2015  LATICACIDVEN 1.16 1.15    No results found for this or any previous visit (from the past 240 hour(s)).     Radiology Studies: US Abdomen Complete  Result Date: 09/12/2017 CLINICAL DATA:  Abdominal and back pain. Renal disorder, hypertension. EXAM: COMPLETE ABDOMINAL ULTRASOUND COMPARISON:  None available FINDINGS: Gallbladder: Wall thickening up to 10 mm. Stones in the dependent aspect of the gallbladder measuring up to 7 mm. Trace pericholecystic fluid. The gallbladder is incompletely distended. Common bile duct:  Normal in caliber,  1.30mm diameter. Liver: Homogeneous in echotexture without focal lesion or intrahepatic bile duct dilatation. IVC:  Negative Pancreas:  Negative Spleen:  No focal lesion, craniocaudal 9cm in length. Right Kidney: No mass or hydronephrosis, echogenic compared to the adjacent liver. 9.2cm in length. Left Kidney:  No lesion or hydronephrosis, 9.8cm in length. Abdominal aorta: Fusiform aneurysm measured up to 3.2 cm in the proximal segment. Trace perisplenic ascites. IMPRESSION: 1. Cholelithiasis with thick walled nondistended gallbladder, suggestive of chronic cholecystitis. 2. Echogenic renal parenchyma, a nonspecific finding often associated with medical renal disease. No hydronephrosis. 3. Trace abdominal ascites. 4. 3.2 cm abdominal aortic aneurysm. Recommend followup by ultrasound in 3 years. This recommendation follows ACR consensus guidelines: White Paper of the ACR Incidental Findings Committee II on Vascular Findings. [name] Radiol 2013; 10:789-794 Electronically Signed   By: Lucrezia Europe M.D.   On: 09/12/2017 17:35      Scheduled Meds: . amLODipine  10 mg Oral Daily  . carvedilol  25 mg Oral BID  . cloNIDine  0.3 mg Oral TID  .  furosemide  20 mg Oral BID  . hydrALAZINE  25 mg Oral TID   Continuous Infusions: . sodium chloride    . esmolol Stopped (09/13/17 1101)     LOS: 1 day    Time spent: 35 minutes   Dessa Phi, DO [facility] [email or web address] Password [email or web address] 09/13/2017, 1:23 PM

## 2017-09-13 NOTE — ED Notes (Signed)
Attempted report 

## 2017-09-13 NOTE — Consult Note (Signed)
Reason for Consult:CKD/AKI/Uncontrolled HTN Referring Physician: Dr. Sharene Skeans Valerie Mathis is an 32 y.o. female.  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  On 4 drugs for HTN ,  Carvedilol, Lasix, Norvasc, hydralazine. In past Clonidine, but not recent.  Ongoing Cocaine use for at least 4 yr off and on and has been using recently.  Knows of some kidney trouble but no F/U or W/U.  No FH or renal dz, no NSAIDS, no stones , UTIs. FH of HTN .  Presented with BP 238/169 and chest pain this am.  Recent cocaine use.  Some evidence of distal aortic dissection.  Has small kidneys, echodense.  No FH of inherited eye, hearing , ms skel defects. Constitutional: as above, not SOB , no  anorexia, N, V Eyes: negative Ears, nose, mouth, throat, and face: dry mouth Respiratory: negative, daily smoker Cardiovascular: as above Gastrointestinal: negative Genitourinary:nocturia x 2-3, more if does not take meds Integument/breast: negative Hematologic/lymphatic: negative Musculoskeletal:hip aching Neurological: negative Behavioral/Psych: substance Endocrine: negative Allergic/Immunologic: negative  Past Medical History:  Diagnosis Date  . CHF (congestive heart failure) ([place])   . CKD (chronic kidney disease) stage 4, GFR 15-29 ml/min (H[facility])   . H/O: alcohol abuse   . Hypertension   . Renal disorder   . 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 REPAIR OF ACUTE ASCENDING THORACIC AORTIC DISSECTION      Family History  Problem Relation Age of Onset  . Hypertension Mother     Social History:  reports that she has been smoking cigarettes.  She has never used smokeless tobacco. She reports that she drank alcohol. Her drug history is not on file.  Allergies: No Known Allergies  Medications: I have reviewed the patient's current  medications. Prior to Admission:  (Not in a hospital admission)  Results for orders placed or performed during the hospital encounter of 09/12/17 (from the past 48 hour(s))  Comprehensive metabolic panel     Status: Abnormal   Collection Time: 09/12/17  3:31 PM  Result Value Ref Range   Sodium 137 135 - 145 mmol/L   Potassium 3.8 3.5 - 5.1 mmol/L   Chloride 107 101 - 111 mmol/L   CO2 18 (L) 22 - 32 mmol/L   Glucose, Bld 102 (H) 65 - 99 mg/dL   BUN 29 (H) 6 - 20 mg/dL   Creatinine, Ser 4.23 (H) 0.44 - 1.00 mg/dL   Calcium 8.9 8.9 - 10.3 mg/dL   Total Protein 6.9 6.5 - 8.1 g/dL   Albumin 3.4 (L) 3.5 - 5.0 g/dL   AST 16 15 - 41 U/L   ALT 10 (L) 14 - 54 U/L   Alkaline Phosphatase 187 (H) 38 - 126 U/L   Total Bilirubin 0.8 0.3 - 1.2 mg/dL   GFR calc non Af Amer 13 (L) >60 mL/min   GFR calc Af Amer 15 (L) >60 mL/min    Comment: (NOTE) The eGFR has been calculated using the CKD EPI equation. This calculation has not been validated in all clinical situations. eGFR's persistently <60 mL/min signify possible Chronic Kidney Disease.    Anion gap 12 5 - 15    Comment: Performed at [facility] [address], [place], [place] [address]  Brain natriuretic peptide     Status:  Abnormal   Collection Time: 09/12/17  3:31 PM  Result Value Ref Range   B Natriuretic Peptide >4,500.0 (H) 0.0 - 100.0 pg/mL    Comment: Performed at [facility] [address] [place], [place] [address]  I-stat troponin, ED     Status: None   Collection Time: 09/12/17  3:36 PM  Result Value Ref Range   Troponin i, poc 0.04 0.00 - 0.08 ng/mL   Comment 3            Comment: Due to the release kinetics of cTnI, a negative result within the first hours of the onset of symptoms does not rule out myocardial infarction with certainty. If myocardial infarction is still suspected, repeat the test at appropriate intervals.   I-Stat beta hCG blood, ED     Status: None   Collection Time: 09/12/17  3:37  PM  Result Value Ref Range   I-stat hCG, quantitative <5.0 <5 mIU/mL   Comment 3            Comment:   GEST. AGE      CONC.  (mIU/mL)   <=1 WEEK        5 - 50     2 WEEKS       50 - 500     3 WEEKS       100 - 10,000     4 WEEKS     1,000 - 30,000        FEMALE AND NON-PREGNANT FEMALE:     LESS THAN 5 mIU/mL   I-stat Creatinine, ED     Status: Abnormal   Collection Time: 09/12/17  3:37 PM  Result Value Ref Range   Creatinine, Ser 4.40 (H) 0.44 - 1.00 mg/dL  I-stat Chem 8, ED     Status: Abnormal   Collection Time: 09/12/17  3:38 PM  Result Value Ref Range   Sodium 137 135 - 145 mmol/L   Potassium 3.9 3.5 - 5.1 mmol/L   Chloride 109 101 - 111 mmol/L   BUN 33 (H) 6 - 20 mg/dL   Creatinine, Ser 4.10 (H) 0.44 - 1.00 mg/dL   Glucose, Bld 101 (H) 65 - 99 mg/dL   Calcium, Ion 1.10 (L) 1.15 - 1.40 mmol/L   TCO2 18 (L) 22 - 32 mmol/L   Hemoglobin 12.6 12.0 - 15.0 g/dL   HCT 37.0 36.0 - 46.0 %  I-Stat CG4 Lactic Acid, ED     Status: None   Collection Time: 09/12/17  3:39 PM  Result Value Ref Range   Lactic Acid, Venous 1.16 0.5 - 1.9 mmol/L  Type and screen [facility]     Status: None   Collection Time: 09/12/17  7:50 PM  Result Value Ref Range   ABO/RH(D) O POS    Antibody Screen NEG    Sample Expiration      09/15/2017 Performed at [facility], [place] [address] [place], [place] [address]   TSH     Status: None   Collection Time: 09/12/17  7:50 PM  Result Value Ref Range   TSH 1.856 0.350 - 4.500 uIU/mL    Comment: Performed by a 3rd Generation assay with a functional sensitivity of <=0.01 uIU/mL. Performed at [facility], [place] [address], [place], [place] [address]   ABO/Rh     Status: None   Collection Time: 09/12/17  7:50 PM  Result Value Ref Range   ABO/RH(D)  O POS Performed at [facility], [place] [address], [place], [place] [address]   I-Stat CG4 Lactic Acid, ED     Status: None   Collection Time: 09/12/17  8:15 PM  Result  Value Ref Range   Lactic Acid, Venous 1.15 0.5 - 1.9 mmol/L  Urinalysis, Routine w reflex microscopic     Status: Abnormal   Collection Time: 09/13/17 12:28 AM  Result Value Ref Range   Color, Urine YELLOW YELLOW   APPearance HAZY (A) CLEAR   Specific Gravity, Urine 1.014 1.005 - 1.030   pH 5.0 5.0 - 8.0   Glucose, UA NEGATIVE NEGATIVE mg/dL   Hgb urine dipstick SMALL (A) NEGATIVE   Bilirubin Urine NEGATIVE NEGATIVE   Ketones, ur NEGATIVE NEGATIVE mg/dL   Protein, ur >=300 (A) NEGATIVE mg/dL   Nitrite NEGATIVE NEGATIVE   Leukocytes, UA SMALL (A) NEGATIVE   RBC / HPF 6-10 0 - 5 RBC/hpf   WBC, UA 21-50 0 - 5 WBC/hpf   Bacteria, UA RARE (A) NONE SEEN   Squamous Epithelial / LPF 0-5 0 - 5    Comment: Please note change in reference range.   Mucus PRESENT     Comment: Performed at [facility], [place] [address], [place], [place] [address]  Rapid urine drug screen (hospital performed)     Status: Abnormal   Collection Time: 09/13/17 12:28 AM  Result Value Ref Range   Opiates NONE DETECTED NONE DETECTED   Cocaine POSITIVE (A) NONE DETECTED   Benzodiazepines POSITIVE (A) NONE DETECTED   Amphetamines NONE DETECTED NONE DETECTED   Tetrahydrocannabinol NONE DETECTED NONE DETECTED   Barbiturates NONE DETECTED NONE DETECTED    Comment: (NOTE) DRUG SCREEN FOR MEDICAL PURPOSES ONLY.  IF CONFIRMATION IS NEEDED FOR ANY PURPOSE, NOTIFY LAB WITHIN 5 DAYS. LOWEST DETECTABLE LIMITS FOR URINE DRUG SCREEN Drug Class                     Cutoff (ng/mL) Amphetamine and metabolites    1000 Barbiturate and metabolites    200 Benzodiazepine                 128 Tricyclics and metabolites     300 Opiates and metabolites        300 Cocaine and metabolites        300 THC                            50 Performed at [facility], [place] [address], [place], [place] [address]   Comprehensive metabolic panel     Status: Abnormal   Collection Time: 09/13/17  2:50 AM  Result Value Ref Range    Sodium 136 135 - 145 mmol/L   Potassium 3.9 3.5 - 5.1 mmol/L   Chloride 109 101 - 111 mmol/L   CO2 17 (L) 22 - 32 mmol/L   Glucose, Bld 83 65 - 99 mg/dL   BUN 31 (H) 6 - 20 mg/dL   Creatinine, Ser 4.43 (H) 0.44 - 1.00 mg/dL   Calcium 8.0 (L) 8.9 - 10.3 mg/dL   Total Protein 5.5 (L) 6.5 - 8.1 g/dL   Albumin 2.7 (L) 3.5 - 5.0 g/dL   AST 10 (L) 15 - 41 U/L   ALT 8 (L) 14 - 54 U/L   Alkaline Phosphatase 147 (H) 38 - 126 U/L   Total Bilirubin 0.7 0.3 - 1.2 mg/dL  GFR calc non Af Amer 12 (L) >60 mL/min   GFR calc Af Amer 14 (L) >60 mL/min    Comment: (NOTE) The eGFR has been calculated using the CKD EPI equation. This calculation has not been validated in all clinical situations. eGFR's persistently <60 mL/min signify possible Chronic Kidney Disease.    Anion gap 10 5 - 15    Comment: Performed at [facility] [address], [place], [place] [address]  CBC     Status: Abnormal   Collection Time: 09/13/17  2:50 AM  Result Value Ref Range   WBC 4.2 4.0 - 10.5 K/uL   RBC 3.71 (L) 3.87 - 5.11 MIL/uL   Hemoglobin 9.5 (L) 12.0 - 15.0 g/dL    Comment: DELTA CHECK NOTED REPEATED TO VERIFY    HCT 29.4 (L) 36.0 - 46.0 %   MCV 79.2 78.0 - 100.0 fL   MCH 25.6 (L) 26.0 - 34.0 pg   MCHC 32.3 30.0 - 36.0 g/dL   RDW 18.3 (H) 11.5 - 15.5 %   Platelets 236 150 - 400 K/uL    Comment: Performed at [facility], [place] [address] [place], [place] [address]  Troponin I     Status: Abnormal   Collection Time: 09/13/17  2:50 AM  Result Value Ref Range   Troponin I 0.05 (HH) <0.03 ng/mL    Comment: CRITICAL RESULT CALLED TO, READ BACK BY AND VERIFIED WITH: Valerie Mathis 09/13/17 0340 WAYK Performed at [facility], [place] [address], [place], [place] [address]     US Abdomen Complete  Result Date: 09/12/2017 CLINICAL DATA:  Abdominal and back pain. Renal disorder, hypertension. EXAM: COMPLETE ABDOMINAL ULTRASOUND COMPARISON:  None available FINDINGS: Gallbladder: Wall thickening  up to 10 mm. Stones in the dependent aspect of the gallbladder measuring up to 7 mm. Trace pericholecystic fluid. The gallbladder is incompletely distended. Common bile duct:  Normal in caliber, 1.52m diameter. Liver: Homogeneous in echotexture without focal lesion or intrahepatic bile duct dilatation. IVC:  Negative Pancreas:  Negative Spleen:  No focal lesion, craniocaudal 9cm in length. Right Kidney: No mass or hydronephrosis, echogenic compared to the adjacent liver. 9.2cm in length. Left Kidney:  No lesion or hydronephrosis, 9.8cm in length. Abdominal aorta: Fusiform aneurysm measured up to 3.2 cm in the proximal segment. Trace perisplenic ascites. IMPRESSION: 1. Cholelithiasis with thick walled nondistended gallbladder, suggestive of chronic cholecystitis. 2. Echogenic renal parenchyma, a nonspecific finding often associated with medical renal disease. No hydronephrosis. 3. Trace abdominal ascites. 4. 3.2 cm abdominal aortic aneurysm. Recommend followup by ultrasound in 3 years. This recommendation follows ACR consensus guidelines: White Paper of the ACR Incidental Findings Committee II on Vascular Findings. JNatasha MeadColl Radiol 2013; 10:789-794 Electronically Signed   By: DLucrezia EuropeM.D.   On: 09/12/2017 17:35    ROS Blood pressure 137/78, pulse 64, temperature 99.4 F (37.4 C), temperature source Oral, resp. rate 15, height 5' 3"  (1.6 m), weight 60.3 kg (133 lb), last menstrual period 07/15/2017, SpO2 98 %. Physical Exam Physical Examination: General appearance - alert, crying,  Mental status - alert, oriented to person, place, and time Eyes - severe HTN changes with exudates, silver wiring, H,av nicking, art narrowing Mouth - mucous membranes moist, pharynx normal without lesions Neck - supple, no significant adenopathy, carotids upstroke normal bilaterally, no bruits Lymphatics - posterior cervical nodes Chest - clear to auscultation, no wheezes, rales or rhonchi, symmetric air entry Heart - S1  and S2  normal, systolic murmur Gr 2/6 at 2nd left intercostal space Abdomen - nontender, pos bs,  Extremities - pedal edema Tr +, intact peripheral pulses Skin - tattos, striae  Assessment/Plan: 1 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  No evidence of TMA.  Suspect severe nephrosclerosis, but ? If could have had FSG as does have proteinuria. Will eval protein.  May have some art edema with severe HTN but suspect Cr will worsen before improves.Near ESRD.  Will look at secondary effects Need to see Renal Arteries to make sure dissection does not involve RA.  2 Aortic dissection Old and new needs eval 3 Hypertension: bp coming down , resume home meds.  Cocaine worsens 4. Anemia eval 5. Metabolic Bone Disease: eval 6 Substance abuse exacerbating and causing the issues P MR, doppler, PTH, Hep studies, quantitate urine protein, Fe, control bps.   Valerie Mathis 09/13/2017, 1:46 PM

## 2017-09-13 NOTE — Progress Notes (Addendum)
Progress Note  Patient Name: Valerie Mathis Date of Encounter: 09/13/2017  Primary Cardiologist: [facility] (Dr. Sallyanne Kuster)   Subjective   Feeling better today. She is currently eating food. She denies any current CP or back pain. Resting comfortably. BP in the ED currently 139/92.   Inpatient Medications    Scheduled Meds: . amLODipine  10 mg Oral Daily  . carvedilol  25 mg Oral BID  . cloNIDine  0.3 mg Oral TID  . furosemide  20 mg Oral BID  . hydrALAZINE  25 mg Oral TID   Continuous Infusions: . sodium chloride    . esmolol 100.055 mcg/kg/min (09/13/17 0746)   PRN Meds: acetaminophen **OR** acetaminophen   Vital Signs    Vitals:   09/13/17 0945 09/13/17 1000 09/13/17 1015 09/13/17 1030  BP: 127/71 124/75 127/76 118/77  Pulse: 70 71 70 70  Resp: 15 16 15 15   Temp:      TempSrc:      SpO2: 100% 99% 99% 99%  Weight:      Height:        Intake/Output Summary (Last 24 hours) at 09/13/2017 1041 Last data filed at 09/12/2017 2058 Gross per 24 hour  Intake 326.95 ml  Output -  Net 326.95 ml   Filed Weights   09/12/17 1500  Weight: 133 lb (60.3 kg)    Telemetry    NSR - Personally Reviewed  ECG    09/12/17, sinus tach 113 bpm - Personally Reviewed  Physical Exam   GEN: No acute distress.   Neck: No JVD Cardiac: RRR, no murmurs, rubs, or gallops.  Respiratory: Clear to auscultation bilaterally. GI: Soft, nontender, non-distended  MS: No edema; No deformity. Neuro:  Nonfocal  Psych: Normal affect   Labs    Chemistry Recent Labs  Lab 09/12/17 1531 09/12/17 1537 09/12/17 1538 09/13/17 0250  NA 137  --  137 136  K 3.8  --  3.9 3.9  CL 107  --  109 109  CO2 18*  --   --  17*  GLUCOSE 102*  --  101* 83  BUN 29*  --  33* 31*  CREATININE 4.23* 4.40* 4.10* 4.43*  CALCIUM 8.9  --   --  8.0*  PROT 6.9  --   --  5.5*  ALBUMIN 3.4*  --   --  2.7*  AST 16  --   --  10*  ALT 10*  --   --  8*  ALKPHOS 187*  --   --  147*  BILITOT 0.8  --   --  0.7    GFRNONAA 13*  --   --  12*  GFRAA 15*  --   --  14*  ANIONGAP 12  --   --  10     Hematology Recent Labs  Lab 09/12/17 1538 09/13/17 0250  WBC  --  4.2  RBC  --  3.71*  HGB 12.6 9.5*  HCT 37.0 29.4*  MCV  --  79.2  MCH  --  25.6*  MCHC  --  32.3  RDW  --  18.3*  PLT  --  236    Cardiac Enzymes Recent Labs  Lab 09/13/17 0250  TROPONINI 0.05*    Recent Labs  Lab 09/12/17 1536  TROPIPOC 0.04     BNP Recent Labs  Lab 09/12/17 1531  BNP >4,500.0*     DDimer No results for input(s): DDIMER in the last 168 hours.   Radiology    US  Abdomen Complete  Result Date: 09/12/2017 CLINICAL DATA:  Abdominal and back pain. Renal disorder, hypertension. EXAM: COMPLETE ABDOMINAL ULTRASOUND COMPARISON:  None available FINDINGS: Gallbladder: Wall thickening up to 10 mm. Stones in the dependent aspect of the gallbladder measuring up to 7 mm. Trace pericholecystic fluid. The gallbladder is incompletely distended. Common bile duct:  Normal in caliber, 1.60mm diameter. Liver: Homogeneous in echotexture without focal lesion or intrahepatic bile duct dilatation. IVC:  Negative Pancreas:  Negative Spleen:  No focal lesion, craniocaudal 9cm in length. Right Kidney: No mass or hydronephrosis, echogenic compared to the adjacent liver. 9.2cm in length. Left Kidney:  No lesion or hydronephrosis, 9.8cm in length. Abdominal aorta: Fusiform aneurysm measured up to 3.2 cm in the proximal segment. Trace perisplenic ascites. IMPRESSION: 1. Cholelithiasis with thick walled nondistended gallbladder, suggestive of chronic cholecystitis. 2. Echogenic renal parenchyma, a nonspecific finding often associated with medical renal disease. No hydronephrosis. 3. Trace abdominal ascites. 4. 3.2 cm abdominal aortic aneurysm. Recommend followup by ultrasound in 3 years. This recommendation follows ACR consensus guidelines: White Paper of the ACR Incidental Findings Committee II on Vascular Findings. [name] Coll Radiol 2013;  10:[id number] Electronically Signed   By: Lucrezia Europe M.D.   On: 09/12/2017 17:35    Cardiac Studies   TEE 09/12/17 FINDINGS:   The aortic root is normal.   There is a graft repair of the ascending aorta and proximal aortic arch. Both the proximal and the distal graft anastomosis appear to be intact.  There is (probably chronic) dissection of the proximal portion of the descending aorta from the distal graft anastomosis to 25 cm from the incisors. An intimal tear is readily identified. The true and false lumen are roughly equal in diameter and the false lumen is distally thrombosed. The aortic diameter is fairly normal (28 mm) in this area. Beyond the area of dissection there is relatively healthy aorta up to 33 cm from the incisors.  At 33 cm from the incisors, there is an area of ulcerated intramural hematoma, relatively short in extent (approximately 2 cm). This may be a new abnormality and responsible for the current symptoms.  Beyond the intramural hematoma, the descending aorta can be followed to the celiac and SMA arteries and appears to be normal. The renal arteries could not be visualized.  Severe left ventricular hypertrophy. Mildly depressed LV systolic function (EF 29-51%), in the setting of severe HTN (210/160 mm Hg). EF may be better during normal loading conditions. Pseudonormal mitral filling, elevated mean left atrial pressure (again, in setting of severely increased afterload). Normal aortic root and trileaflet aortic valve (trace aortic insufficiency). Normal mitral valve. The left atrium is probably mildly dilated. 2+ TR. Estimated systolic PA pressure is 88-41 mm Hg.   RECOMMENDATIONS:     Aggressive control of BP, with preferential use of beta blockers.   Patient Profile     32 y/o female with a h/o aortic dissection, roughly 1 year ago, treated with surgery in [place] (request for records sent). 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 She presented to the [facility] ED 09/12/17 with complaint of chest and back pain, in the setting of hypertensive urgency and renal insuffiencey w/ a SCr of 4 (baseline unknown).   Assessment & Plan    1. History of Aortic Dissection: treated in [place] roughly 1 year ago. Records have been requested. Pt presented with chest and back pain in the setting of elevated BP. CT scan  not done due to SCr of 4. TEE was performed yesterday to evaluate. Per report:  The aortic root is normal.   There is a graft repair of the ascending aorta and proximal aortic arch. Both the proximal and the distal graft anastomosis appear to be intact.  There is (probably chronic) dissection of the proximal portion of the descending aorta from the distal graft anastomosis to 25 cm from the incisors. An intimal tear is readily identified. The true and false lumen are roughly equal in diameter and the false lumen is distally thrombosed. The aortic diameter is fairly normal (28 mm) in this area. Beyond the area of dissection there is relatively healthy aorta up to 33 cm from the incisors.  At 33 cm from the incisors, there is an area of ulcerated intramural hematoma, relatively short in extent (approximately 2 cm). This may be a new abnormality and responsible for the current symptoms.  Beyond the intramural hematoma, the descending aorta can be followed to the celiac and SMA arteries and appears to be normal. The renal arteries could not be visualized.  Recommendations: Based on assessment, aggressive control of BP with preferential use of beta blockers recommended. She has also been seen by CT surgery (Dr. Roxy Manns) who also agreed with medical theapy. She is on Coreg, 25 mg BID, along with amlodipine 10 mg, Clonidine 0.3 mg TID, Lasix 20 mg BID and hydralazine 25 mg TID. BP is improved today at 146/88. HR in the 70s. I've discussed case with Dr. Meda Coffee. We will also plan to get a MRA of the chest,  abdomen and pelvis w/o contrast for further assessment.   For questions or updates, please contact [facility] Please consult [email or web address] for contact info under Cardiology/STEMI.   Signed, Lyda Jester, PA-C  09/13/2017, 10:41 AM    The patient was seen, examined and discussed with Brittainy M. Rosita Fire, PA-C and I agree with the above.   32 year old female with h/o cocaine abuse, h/o aortic dissection approximately a year ago, treated with aortic root replacement in [place] (request for records sent), she has a long-standing history of extremely severe hypertension possibly sec to chronic cocaine use.  She presented with epigastric pain/chest pain and hypertensive emergency, renal insuffiencey w/ a SCr of 4 (baseline unknown). TEE showed severe hypertensive cardiomyopathy/LVH. Mildly depressed LVEF, possibly due to severe afterload increase (BP 210/140 at onset of study). Moderate pulmonary artery hypertension, likely secondary to left heart failure.  Findings of old Type A aortic dissection with previous surgical graft repair of the ascending aorta and hemiarch, but with persistent dissection of the distal aortic arch and proximal descending thoracic aorta, up to 25 cm from the incisors. These are probably chronic abnormalities. Small intramural hematoma with ulceration, but without frank dissection, in the distal descending thoracic aorta. This may be a new, acute abnormality. Mildly dilated aortic arch and descending thoracic aorta.  Symptoms resolved with BP control. I have ordered MRA chest/abdomen/pelvis to further evaluate for dissection, intramural hematoma and hopefully visualize renal arteries as well. Her urine tested positive for cocaine and protein, nephrology thinks her renal failure might be secondary to cocaine use. CT surgery recommended to consult vascular surgery.  Continue current medication dosages, I assume that she will not require less once cocaine wears out.  Ena Dawley, MD 09/13/2017

## 2017-09-13 NOTE — ED Notes (Signed)
Per cardiology to dc esmolol drip and administer po meds

## 2017-09-13 NOTE — ED Notes (Signed)
ORDERED A HEART HEALTHY BREAKFAST TRAY--[name]

## 2017-09-14 ENCOUNTER — Inpatient Hospital Stay (HOSPITAL_COMMUNITY): Payer: Medicaid Other

## 2017-09-14 DIAGNOSIS — I7102 Dissection of abdominal aorta: Secondary | ICD-10-CM

## 2017-09-14 DIAGNOSIS — I7100 Dissection of unspecified site of aorta: Secondary | ICD-10-CM

## 2017-09-14 LAB — RENAL FUNCTION PANEL
ALBUMIN: 2.6 g/dL — AB (ref 3.5–5.0)
Anion gap: 11 (ref 5–15)
BUN: 31 mg/dL — AB (ref 6–20)
CALCIUM: 8.1 mg/dL — AB (ref 8.9–10.3)
CO2: 18 mmol/L — ABNORMAL LOW (ref 22–32)
CREATININE: 4.24 mg/dL — AB (ref 0.44–1.00)
Chloride: 110 mmol/L (ref 101–111)
GFR calc Af Amer: 15 mL/min — ABNORMAL LOW (ref >60)
GFR calc non Af Amer: 13 mL/min — ABNORMAL LOW (ref >60)
GLUCOSE: 89 mg/dL (ref 65–99)
PHOSPHORUS: 3.8 mg/dL (ref 2.5–4.6)
Potassium: 4 mmol/L (ref 3.5–5.1)
SODIUM: 139 mmol/L (ref 135–145)

## 2017-09-14 LAB — PROTEIN, URINE, 24 HOUR
Collection Interval-UPROT: 24 hours
PROTEIN, URINE: 61 mg/dL
Protein, 24H Urine: 1403 mg/d — ABNORMAL HIGH (ref 50–100)
Urine Total Volume-UPROT: 2300 mL

## 2017-09-14 LAB — CBC
HCT: 30.4 % — ABNORMAL LOW (ref 36.0–46.0)
Hemoglobin: 9.7 g/dL — ABNORMAL LOW (ref 12.0–15.0)
MCH: 25.3 pg — ABNORMAL LOW (ref 26.0–34.0)
MCHC: 31.9 g/dL (ref 30.0–36.0)
MCV: 79.4 fL (ref 78.0–100.0)
PLATELETS: 241 10*3/uL (ref 150–400)
RBC: 3.83 MIL/uL — ABNORMAL LOW (ref 3.87–5.11)
RDW: 18.7 % — AB (ref 11.5–15.5)
WBC: 4.9 10*3/uL (ref 4.0–10.5)

## 2017-09-14 LAB — HCV COMMENT:

## 2017-09-14 LAB — CREATININE CLEARANCE, URINE, 24 HOUR
COLLECTION INTERVAL-CRCL: 24 h
CREATININE 24H UR: 770 mg/d (ref 600–1800)
Creatinine Clearance: 13 mL/min — ABNORMAL LOW (ref 75–115)
Creatinine, Urine: 33.47 mg/dL
URINE TOTAL VOLUME-CRCL: 2300 mL

## 2017-09-14 LAB — PROTEIN ELECTROPHORESIS, SERUM
A/G Ratio: 1 (ref 0.7–1.7)
ALPHA-1-GLOBULIN: 0.3 g/dL (ref 0.0–0.4)
Albumin ELP: 2.8 g/dL — ABNORMAL LOW (ref 2.9–4.4)
Alpha-2-Globulin: 0.8 g/dL (ref 0.4–1.0)
BETA GLOBULIN: 0.7 g/dL (ref 0.7–1.3)
GAMMA GLOBULIN: 0.9 g/dL (ref 0.4–1.8)
GLOBULIN, TOTAL: 2.7 g/dL (ref 2.2–3.9)
Total Protein ELP: 5.5 g/dL — ABNORMAL LOW (ref 6.0–8.5)

## 2017-09-14 LAB — HEPATITIS B CORE ANTIBODY, IGM: HEP B C IGM: NEGATIVE

## 2017-09-14 LAB — HEPATITIS B SURFACE ANTIBODY,QUALITATIVE: Hep B S Ab: REACTIVE

## 2017-09-14 LAB — HEPATITIS B SURFACE ANTIGEN: Hepatitis B Surface Ag: NEGATIVE

## 2017-09-14 LAB — HEPATITIS C ANTIBODY (REFLEX): HCV Ab: 0.4 s/co ratio (ref 0.0–0.9)

## 2017-09-14 LAB — PARATHYROID HORMONE, INTACT (NO CA): PTH: 401 pg/mL — AB (ref 15–65)

## 2017-09-14 MED ORDER — DARBEPOETIN ALFA 60 MCG/0.3ML IJ SOSY
60.0000 ug | PREFILLED_SYRINGE | INTRAMUSCULAR | Status: DC
  Administered 2017-09-14: 60 ug via SUBCUTANEOUS
  Filled 2017-09-14: qty 0.3

## 2017-09-14 MED ORDER — FUROSEMIDE 80 MG PO TABS
80.0000 mg | ORAL_TABLET | Freq: Two times a day (BID) | ORAL | Status: DC
  Administered 2017-09-14 – 2017-09-15 (×3): 80 mg via ORAL
  Filled 2017-09-14 (×3): qty 1

## 2017-09-14 MED ORDER — FUROSEMIDE 80 MG PO TABS: 80.0000 mg | ORAL_TABLET | Freq: Every day | ORAL | Status: DC

## 2017-09-14 MED ORDER — CALCITRIOL 1 MCG/ML IV SOLN
0.2500 ug | Freq: Every day | INTRAVENOUS | Status: DC
  Filled 2017-09-14: qty 0.25

## 2017-09-14 MED ORDER — ISOSORBIDE MONONITRATE ER 30 MG PO TB24
30.0000 mg | ORAL_TABLET | Freq: Every day | ORAL | Status: DC
  Administered 2017-09-14 – 2017-09-15 (×2): 30 mg via ORAL
  Filled 2017-09-14 (×2): qty 1

## 2017-09-14 MED ORDER — FERROUS SULFATE 325 (65 FE) MG PO TABS
325.0000 mg | ORAL_TABLET | Freq: Two times a day (BID) | ORAL | Status: DC
  Administered 2017-09-14 – 2017-09-15 (×2): 325 mg via ORAL
  Filled 2017-09-14 (×2): qty 1

## 2017-09-14 MED ORDER — SODIUM CHLORIDE 0.9 % IV SOLN
510.0000 mg | Freq: Once | INTRAVENOUS | Status: AC
  Administered 2017-09-14: 510 mg via INTRAVENOUS
  Filled 2017-09-14: qty 17

## 2017-09-14 MED ORDER — CALCITRIOL 0.25 MCG PO CAPS
0.2500 ug | ORAL_CAPSULE | Freq: Every day | ORAL | Status: DC
  Administered 2017-09-14 – 2017-09-15 (×2): 0.25 ug via ORAL
  Filled 2017-09-14 (×2): qty 1

## 2017-09-14 NOTE — Progress Notes (Signed)
Right a/c IV infiltrate noted during feraheme infusion.  Site flushed prior to infusion and pt denied pain or discomfort.  Approximately 5 mins into infusion, pt stated her arm felt tight, "is it suppose to be getting tight like this?".  Infusion stopped.  MD notified.  Infusion restarted in alternate site and infused w/o difficulty.   Anson Crofts, RN

## 2017-09-14 NOTE — Progress Notes (Addendum)
Progress Note  Patient Name: Valerie Mathis Date of Encounter: 09/14/2017  Primary Cardiologist: New (Dr. Sallyanne Kuster)   Subjective   Chest pain and abdominal pain has resolved.  Inpatient Medications    Scheduled Meds: . amLODipine  10 mg Oral Daily  . calcitRIOL  0.25 mcg Oral Daily  . carvedilol  25 mg Oral BID  . cloNIDine  0.3 mg Oral TID  . darbepoetin (ARANESP) injection - NON-DIALYSIS  60 mcg Subcutaneous Q Wed-1800  . ferrous sulfate  325 mg Oral BID WC  . furosemide  80 mg Oral BID  . hydrALAZINE  25 mg Oral TID   Continuous Infusions: . sodium chloride    . ferumoxytol     PRN Meds: acetaminophen **OR** acetaminophen, labetalol   Vital Signs    Vitals:   09/14/17 0429 09/14/17 0500 09/14/17 0812 09/14/17 1106  BP: (!) 153/89  (!) 170/97 (!) 145/84  Pulse: 68  65 60  Resp: 16  18 18   Temp: 98.1 F (36.7 C)  98.4 F (36.9 C)   TempSrc: Oral  Oral Oral  SpO2:   100% 100%  Weight:  124 lb 9 oz (56.5 kg)    Height:        Intake/Output Summary (Last 24 hours) at 09/14/2017 1303 Last data filed at 09/14/2017 1216 Gross per 24 hour  Intake 100 ml  Output 1650 ml  Net -1550 ml   Filed Weights   09/12/17 1500 09/13/17 1434 09/14/17 0500  Weight: 133 lb (60.3 kg) 131 lb 9.8 oz (59.7 kg) 124 lb 9 oz (56.5 kg)    Telemetry    NSR - Personally Reviewed  ECG    09/12/17, sinus tach 113 bpm - Personally Reviewed  Physical Exam   GEN: No acute distress.   Neck: No JVD Cardiac: RRR, no murmurs, rubs, or gallops.  Respiratory: Clear to auscultation bilaterally. GI: Soft, nontender, non-distended  MS: No edema; No deformity. Neuro:  Nonfocal  Psych: Normal affect   Labs    Chemistry Recent Labs  Lab 09/12/17 1531  09/12/17 1538 09/13/17 0250 09/14/17 0258  NA 137  --  137 136 139  K 3.8  --  3.9 3.9 4.0  CL 107  --  109 109 110  CO2 18*  --   --  17* 18*  GLUCOSE 102*  --  101* 83 89  BUN 29*  --  33* 31* 31*  CREATININE 4.23*   < > 4.10*  4.43* 4.24*  CALCIUM 8.9  --   --  8.0* 8.1*  PROT 6.9  --   --  5.5*  --   ALBUMIN 3.4*  --   --  2.7* 2.6*  AST 16  --   --  10*  --   ALT 10*  --   --  8*  --   ALKPHOS 187*  --   --  147*  --   BILITOT 0.8  --   --  0.7  --   GFRNONAA 13*  --   --  12* 13*  GFRAA 15*  --   --  14* 15*  ANIONGAP 12  --   --  10 11   < > = values in this interval not displayed.     Hematology Recent Labs  Lab 09/12/17 1538 09/13/17 0250 09/14/17 0258  WBC  --  4.2 4.9  RBC  --  3.71* 3.83*  HGB 12.6 9.5* 9.7*  HCT 37.0 29.4* 30.4*  MCV  --  79.2 79.4  MCH  --  25.6* 25.3*  MCHC  --  32.3 31.9  RDW  --  18.3* 18.7*  PLT  --  236 241    Cardiac Enzymes Recent Labs  Lab 09/13/17 0250 09/13/17 1445  TROPONINI 0.05* 0.04*    Recent Labs  Lab 09/12/17 1536  TROPIPOC 0.04     BNP Recent Labs  Lab 09/12/17 1531  BNP >4,500.0*     DDimer No results for input(s): DDIMER in the last 168 hours.   Radiology    Dg Chest 1 View  Result Date: 09/13/2017 CLINICAL DATA:  Chest pain EXAM: CHEST  1 VIEW COMPARISON:  None. FINDINGS: There is no edema or consolidation. Heart is mildly enlarged with pulmonary vascularity within normal limits. Thoracic aorta is mildly prominent. Patient is status post median sternotomy with surgical clips in the mid chest and right upper chest regions. No pneumothorax. No bone lesions. IMPRESSION: Postoperative changes. Heart mildly enlarged. Thoracic aorta mildly prominent for age. No edema or consolidation. No pneumothorax. Electronically Signed   By: Lowella Grip III M.D.   On: 09/13/2017 14:13   Mr Jodene Nam Chest Wo Contrast  Result Date: 09/13/2017 CLINICAL DATA:  Thoracic Stanford type a aortic dissection. Status post repair. Repair performed in 2018. New onset severe back pain. Patient was evaluated by cardiology and cardiothoracic surgery after transesophageal echo. EXAM: MRA CHEST, ABDOMEN AND PELVIS WITHOUT CONTRAST TECHNIQUE: Multiplanar, multiecho  pulse sequences of the chest, abdomen, and pelvis were obtained without intravenous contrast. Angiographic images of abdomen and pelvis were obtained using MRA technique without intravenous contrast. CONTRAST:  None COMPARISON:  None. FINDINGS: The study is severely limited due to noncontrast MRA technique. Candy cane cine images are also included. Limited imaging of the vascular system is performed from the chest through the pelvis. Vascular: Thorax: Ascending aortic graft repair is difficult to delineate. The ascending aorta is normal in caliber and patent without evidence of dissection. Maximal diameter is 3.5 cm. A dissection in the thoracic aorta is present. It begins just beyond the takeoff of the left subclavian artery. The true lumen is somewhat narrowed in the distal aortic arch and proximal descending thoracic aorta. There is mixed signal intensity in the false lumen which begins just beyond the left subclavian artery and extends to the mid descending thoracic aorta. This signal may represent artifact or possibly an element of hematoma within the false lumen. This discrepancy cannot be resolved on this noncontrast study. Candy cane images demonstrate flow in the true lumen. Flow cannot be confirmed in the false lumen on the candy cane images. I suspect that there is a combination of flow and chronic mural thrombus within the false lumen. I also suspect at the distal aspect of the dissection, within the mid descending thoracic aorta is fenestrated. See images 13 and 14 of series 4. Maximal diameter of the proximal descending thoracic aorta is 4.0 cm. The origins of the innominate artery, left common carotid artery, and left subclavian artery are grossly patent. There is no obvious acute pulmonary thromboembolism. Abdomen and pelvis: There is no evidence of aortic dissection or aneurysm within the abdomen. The origins of the celiac and SMA are grossly patent. Renal arteries are not clearly visualized. Common  iliac arteries are poorly visualized but are grossly patent. Nonvascular: Thorax: There is no evidence of mediastinal mass effect. There is no obvious mediastinal hemorrhage. Small right layering pleural effusion and basilar atelectasis is noted. Abdomen of pelvis: Trace free fluid about  the spleen. A small amount of pericholecystic fluid adjacent to the gallbladder is suspected in the liver spleen, pancreas, kidneys, and adrenal glands are grossly within normal limits. IMPRESSION: Vascular: The examination is markedly limited due to lack of intravenous contrast. Ascending aortic graft repair is patent without evidence of complication. Thoracic aortic dissection is noted beginning just beyond the takeoff of the left subclavian artery. Origins of the great vessels are grossly patent including the left subclavian artery The true lumen is somewhat narrowed. The false lumen extends from the distal arch to the mid descending thoracic aorta. I suspect that there is a combination of flow and hematoma within the false lumen. The distal and is fenestrated. No evidence of dissection within the abdominal aorta. Nonvascular: Small right pleural effusion. Small amount of free fluid adjacent to the spleen. Small amount of pericholecystic fluid. Electronically Signed   By: Marybelle Killings M.D.   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 Repair performed in 2018. New onset severe back pain. Patient was evaluated by cardiology and cardiothoracic surgery after transesophageal echo. EXAM: MRA CHEST, ABDOMEN AND PELVIS WITHOUT CONTRAST TECHNIQUE: Multiplanar, multiecho pulse sequences of the chest, abdomen, and pelvis were obtained without intravenous contrast. Angiographic images of abdomen and pelvis were obtained using MRA technique without intravenous contrast. CONTRAST:  None COMPARISON:  None. FINDINGS: The study is  severely limited due to noncontrast MRA technique. Candy cane cine images are also included. Limited imaging of the vascular system is performed from the chest through the pelvis. Vascular: Thorax: Ascending aortic graft repair is difficult to delineate. The ascending aorta is normal in caliber and patent without evidence of dissection. Maximal diameter is 3.5 cm. A dissection in the thoracic aorta is present. It begins just beyond the takeoff of the left subclavian artery. The true lumen is somewhat narrowed in the distal aortic arch and proximal descending thoracic aorta. There is mixed signal intensity in the false lumen which begins just beyond the left subclavian artery and extends to the mid descending thoracic aorta. This signal may represent artifact or possibly an element of hematoma within the false lumen. This discrepancy cannot be resolved on this noncontrast study. Candy cane images demonstrate flow in the true lumen. Flow cannot be confirmed in the false lumen on the candy cane images. I suspect that there is a combination of flow and chronic mural thrombus within the false lumen. I also suspect at the distal aspect of the dissection, within the mid descending thoracic aorta is fenestrated. See images 13 and 14 of series 4. Maximal diameter of the proximal descending thoracic aorta is 4.0 cm. The origins of the innominate artery, left common carotid artery, and left subclavian artery are grossly patent. There is no obvious acute pulmonary thromboembolism. Abdomen and pelvis: There is no evidence of aortic dissection or aneurysm within the abdomen. The origins of the celiac and SMA are grossly patent. Renal arteries are not clearly visualized. Common iliac arteries are poorly visualized but are grossly patent. Nonvascular: Thorax: There is no evidence of mediastinal mass effect. There is no obvious mediastinal hemorrhage. Small right layering pleural effusion and basilar atelectasis is noted. Abdomen of  pelvis: Trace free fluid about the spleen. A small amount of pericholecystic fluid adjacent to the gallbladder is suspected in the liver spleen, pancreas, kidneys, and adrenal glands are grossly within normal limits. IMPRESSION: Vascular: The examination is  markedly limited due to lack of intravenous contrast. Ascending aortic graft repair is patent without evidence of complication. Thoracic aortic dissection is noted beginning just beyond the takeoff of the left subclavian artery. Origins of the great vessels are grossly patent including the left subclavian artery The true lumen is somewhat narrowed. The false lumen extends from the distal arch to the mid descending thoracic aorta. I suspect that there is a combination of flow and hematoma within the false lumen. The distal and is fenestrated. No evidence of dissection within the abdominal aorta. Nonvascular: Small right pleural effusion. Small amount of free fluid adjacent to the spleen. Small amount of pericholecystic fluid. Electronically Signed   By: Marybelle Killings M.D.   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 Repair performed in 2018. New onset severe back pain. Patient was evaluated by cardiology and cardiothoracic surgery after transesophageal echo. EXAM: MRA CHEST, ABDOMEN AND PELVIS WITHOUT CONTRAST TECHNIQUE: Multiplanar, multiecho pulse sequences of the chest, abdomen, and pelvis were obtained without intravenous contrast. Angiographic images of abdomen and pelvis were obtained using MRA technique without intravenous contrast. CONTRAST:  None COMPARISON:  None. FINDINGS: The study is severely limited due to noncontrast MRA technique. Candy cane cine images are also included. Limited imaging of the vascular system is performed from the chest through the pelvis. Vascular: Thorax: Ascending aortic graft repair is difficult to delineate. The  ascending aorta is normal in caliber and patent without evidence of dissection. Maximal diameter is 3.5 cm. A dissection in the thoracic aorta is present. It begins just beyond the takeoff of the left subclavian artery. The true lumen is somewhat narrowed in the distal aortic arch and proximal descending thoracic aorta. There is mixed signal intensity in the false lumen which begins just beyond the left subclavian artery and extends to the mid descending thoracic aorta. This signal may represent artifact or possibly an element of hematoma within the false lumen. This discrepancy cannot be resolved on this noncontrast study. Candy cane images demonstrate flow in the true lumen. Flow cannot be confirmed in the false lumen on the candy cane images. I suspect that there is a combination of flow and chronic mural thrombus within the false lumen. I also suspect at the distal aspect of the dissection, within the mid descending thoracic aorta is fenestrated. See images 13 and 14 of series 4. Maximal diameter of the proximal descending thoracic aorta is 4.0 cm. The origins of the innominate artery, left common carotid artery, and left subclavian artery are grossly patent. There is no obvious acute pulmonary thromboembolism. Abdomen and pelvis: There is no evidence of aortic dissection or aneurysm within the abdomen. The origins of the celiac and SMA are grossly patent. Renal arteries are not clearly visualized. Common iliac arteries are poorly visualized but are grossly patent. Nonvascular: Thorax: There is no evidence of mediastinal mass effect. There is no obvious mediastinal hemorrhage. Small right layering pleural effusion and basilar atelectasis is noted. Abdomen of pelvis: Trace free fluid about the spleen. A small amount of pericholecystic fluid adjacent to the gallbladder is suspected in the liver spleen, pancreas, kidneys, and adrenal glands are grossly within normal limits. IMPRESSION: Vascular: The examination is  markedly limited due to lack of intravenous contrast. Ascending aortic graft repair is patent without evidence of complication. Thoracic aortic dissection is noted beginning just beyond the takeoff of the left subclavian artery.  Origins of the great vessels are grossly patent including the left subclavian artery The true lumen is somewhat narrowed. The false lumen extends from the distal arch to the mid descending thoracic aorta. I suspect that there is a combination of flow and hematoma within the false lumen. The distal and is fenestrated. No evidence of dissection within the abdominal aorta. Nonvascular: Small right pleural effusion. Small amount of free fluid adjacent to the spleen. Small amount of pericholecystic fluid. Electronically Signed   By: Marybelle Killings M.D.   On: 09/13/2017 18:24   US Abdomen Complete  Result Date: 09/12/2017 CLINICAL DATA:  Abdominal and back pain. Renal disorder, hypertension. EXAM: COMPLETE ABDOMINAL ULTRASOUND COMPARISON:  None available FINDINGS: Gallbladder: Wall thickening up to 10 mm. Stones in the dependent aspect of the gallbladder measuring up to 7 mm. Trace pericholecystic fluid. The gallbladder is incompletely distended. Common bile duct:  Normal in caliber, 1.67mm diameter. Liver: Homogeneous in echotexture without focal lesion or intrahepatic bile duct dilatation. IVC:  Negative Pancreas:  Negative Spleen:  No focal lesion, craniocaudal 9cm in length. Right Kidney: No mass or hydronephrosis, echogenic compared to the adjacent liver. 9.2cm in length. Left Kidney:  No lesion or hydronephrosis, 9.8cm in length. Abdominal aorta: Fusiform aneurysm measured up to 3.2 cm in the proximal segment. Trace perisplenic ascites. IMPRESSION: 1. Cholelithiasis with thick walled nondistended gallbladder, suggestive of chronic cholecystitis. 2. Echogenic renal parenchyma, a nonspecific finding often associated with medical renal disease. No hydronephrosis. 3. Trace abdominal ascites. 4.  3.2 cm abdominal aortic aneurysm. Recommend followup by ultrasound in 3 years. This recommendation follows ACR consensus guidelines: White Paper of the ACR Incidental Findings Committee II on Vascular Findings. [name] Coll Radiol 2013; 10:789-794 Electronically Signed   By: Lucrezia Europe M.D.   On: 09/12/2017 17:35    Cardiac Studies   TEE 09/12/17 FINDINGS:   The aortic root is normal.   There is a graft repair of the ascending aorta and proximal aortic arch. Both the proximal and the distal graft anastomosis appear to be intact.  There is (probably chronic) dissection of the proximal portion of the descending aorta from the distal graft anastomosis to 25 cm from the incisors. An intimal tear is readily identified. The true and false lumen are roughly equal in diameter and the false lumen is distally thrombosed. The aortic diameter is fairly normal (28 mm) in this area. Beyond the area of dissection there is relatively healthy aorta up to 33 cm from the incisors.  At 33 cm from the incisors, there is an area of ulcerated intramural hematoma, relatively short in extent (approximately 2 cm). This may be a new abnormality and responsible for the current symptoms.  Beyond the intramural hematoma, the descending aorta can be followed to the celiac and SMA arteries and appears to be normal. The renal arteries could not be visualized.  Severe left ventricular hypertrophy. Mildly depressed LV systolic function (EF 47-82%), in the setting of severe HTN (210/160 mm Hg). EF may be better during normal loading conditions. Pseudonormal mitral filling, elevated mean left atrial pressure (again, in setting of severely increased afterload). Normal aortic root and trileaflet aortic valve (trace aortic insufficiency). Normal mitral valve. The left atrium is probably mildly dilated. 2+ TR. Estimated systolic PA pressure is 95-62 mm Hg.   RECOMMENDATIONS:     Aggressive control of BP, with  preferential use of beta blockers.   Patient Profile     32 y/o female with a h/o aortic  dissection, roughly 1 year ago, treated with surgery in [place] (request for records sent). 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 She presented to the [facility] ED 09/12/17 with complaint of chest and back pain, in the setting of hypertensive urgency and renal insuffiencey w/ a SCr of 4 (baseline unknown).   Assessment & Plan    1. Hypertensive emergency 2. S/P type A aortic dissection and repair 3. 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 Cocaine abuse 5. CKD stage 26   32 year old female with h/o cocaine abuse, h/o aortic dissection approximately a year ago, treated with aortic root replacement in [place] (request for records sent), she has a long-standing history of extremely severe hypertension possibly sec to chronic cocaine use.  She presented with epigastric pain/chest pain and hypertensive emergency, renal insuffiencey w/ a SCr of 4 (baseline unknown). TEE showed severe hypertensive cardiomyopathy/LVH. Mildly depressed LVEF, possibly due to severe afterload increase (BP 210/140 at onset of study). Moderate pulmonary artery hypertension, likely secondary to left heart failure.  Findings of old Type A aortic dissection with previous surgical graft repair of the ascending aorta and hemiarch, but with persistent dissection of the distal aortic arch and proximal descending thoracic aorta, up to 25 cm from the incisors. These are probably chronic abnormalities. Small intramural hematoma with ulceration, but without frank dissection, in the distal descending thoracic aorta. This may be a new, acute abnormality. Mildly dilated aortic arch and descending thoracic aorta.  Symptoms resolved with BP control. MRA chest/abdomen/pelvis showed patent type A dissection repair, dissection originating at the distal aortic arch and extending into the  descending thoracic aorta but not into abdominal aorta. MRA didn't show any renal artery stenosis, nephrology planning to perform and renal arterial US.   She remains hypertensive, I would add imdur 30 mg po daily to her regimen.   Ena Dawley, MD 09/14/2017

## 2017-09-14 NOTE — Discharge Summary (Deleted)
Hospital Consult    Reason for Consult:  Aortic dissection Requesting Physician:  Reesa Chew MRN #:  [id number]  History of Present Illness: This is a 32 y.o. female who was admitted Monday with lower back pain that started last Tuesday.  She states that it got worse over the course of the week and that is why she came to the hospital.  Sitting up would make it better and laying down would make it worse.  She has a hx of ascending aortic repair last year in [place].  Upon presentation to the hospital this week, she was in hypertensive crisis with BP of 257/174. She states she did use cocaine.  She uses tobacco.  She states she did not take her blood pressure medicine that day.  She states now that her BP is under better control, her pain has resolved.  She states she has a hx of heart failure, kidney disease.  She has CKD 4.  She states she has a hx of a blood clot in her left arm and was on coumadin.  She is not longer on coumadin.  She denies hx of stroke.   She is on a CCB, BB and clonidine for blood pressure management.  She also takes lasix and hydralazine.    She had surgery last year for an ectopic pregnancy.  She has 2 children.  She smokes cigarettes.  She is right hand dominant.    Past Medical History:  Diagnosis Date  . Aortic dissection ([place])   . CHF (congestive heart failure) ([place])   . CKD (chronic kidney disease) stage 4, GFR 15-29 ml/min ([facility])   . H/O: alcohol abuse   . Hypertension   . Renal disorder   . 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 ECTOPIC PREGNANCY SURGERY  2018  . REPAIR OF ACUTE ASCENDING THORACIC AORTIC DISSECTION      No Known Allergies  Prior to Admission medications   Medication Sig Start Date End Date Taking? Authorizing Provider  amLODipine (NORVASC) 10 MG tablet Take 10 mg by mouth daily. for high blood pressure 07/21/17  Yes [provider]  carvedilol (COREG) 25 MG tablet  Take 25 mg by mouth 2 (two) times daily. For blood pressure/heart 07/21/17  Yes [provider]  cloNIDine (CATAPRES) 0.3 MG tablet Take 0.3 mg by mouth 3 (three) times daily. For high blood pressure 07/21/17  Yes [provider]  furosemide (LASIX) 20 MG tablet Take 20 mg by mouth 2 (two) times daily. For high blood pressure/fluid 07/21/17  Yes [provider]  hydrALAZINE (APRESOLINE) 25 MG tablet Take 25 mg by mouth 3 (three) times daily. For high blood pressure 07/21/17  Yes [provider]    Social History   Socioeconomic History  . Marital status: Single    Spouse name: Not on file  . Number of children: Not on file  . Years of education: Not on file  . Highest education level: Not on file  Occupational History  . Not on file  Social Needs  . Financial resource strain: Not on file  . Food insecurity:    Worry: Not on file    Inability: Not on file  . Transportation needs:    Medical: Not on file    Non-medical: Not on file  Tobacco Use  . Smoking status: Current Every Day Smoker    Packs/day: 0.50    Years: 4.00    Pack  years: 2.00    Types: Cigarettes  . Smokeless tobacco: Never Used  Substance and Sexual Activity  . Alcohol use: Yes    Comment: OCCASIONAL  . Drug use: Yes    Types: Cocaine, Benzodiazepines  . Sexual activity: Yes  Lifestyle  . Physical activity:    Days per week: Not on file    Minutes per session: Not on file  . Stress: Not on file  Relationships  . Social connections:    Talks on phone: Not on file    Gets together: Not on file    Attends religious service: Not on file    Active member of club or organization: Not on file    Attends meetings of clubs or organizations: Not on file    Relationship status: Not on file  . Intimate partner violence:    Fear of current or ex partner: Not on file    Emotionally abused: Not on file    Physically abused: Not on file    Forced sexual activity: Not on file  Other  Topics Concern  . Not on file  Social History Narrative  . Not on file     Family History  Problem Relation Age of Onset  . Hypertension Mother     ROS: [x]  Positive   [ ]  Negative   [ ]  All sytems reviewed and are negative  Cardiac: []  chest pain/pressure [x]  back pain [x]  high blood pressure []  palpitations []  SOB lying flat []  DOE  Vascular: []  pain in legs while walking []  pain in legs at rest []  pain in legs at night []  non-healing ulcers [x]  hx of DVT-left arm []  swelling in legs  Pulmonary: []  productive cough []  asthma/wheezing []  home O2  Neurologic: []  weakness in []  arms []  legs []  numbness in []  arms []  legs []  hx of CVA []  mini stroke [] difficulty speaking or slurred speech []  temporary loss of vision in one eye []  dizziness  Hematologic: []  hx of cancer []  bleeding problems []  problems with blood clotting easily  Endocrine:   []  diabetes []  thyroid disease  GI []  vomiting blood []  blood in stool  GU: [x]  CKD/renal failure []  HD--[]  M/W/F or []  T/T/S []  blood in urine  Psychiatric: []  anxiety []  depression  Musculoskeletal: []  arthritis []  joint pain  Integumentary: []  rashes []  ulcers  Constitutional: []  fever []  chills   Physical Examination  Vitals:   09/14/17 0812 09/14/17 1106  BP: (!) 170/97 (!) 145/84  Pulse: 65 60  Resp: 18 18  Temp: 98.4 F (36.9 C)   SpO2: 100% 100%   Body mass index is 22.06 kg/m.  General:  WDWN in NAD Gait: Not observed HENT: WNL, normocephalic Pulmonary: normal non-labored breathing, without Rales, rhonchi,  wheezing Cardiac: regular, with  Murmur; without carotid bruits Abdomen:  soft, NT/ND, no masses Skin: without rashes Vascular Exam/Pulses:  Right Left  Radial 2+ (normal) 2+ (normal)  Ulnar 1+ (weak) 1+ (weak)  DP 2+ (normal) 2+ (normal)  PT 1+ (weak) 1+ (weak)   Extremities: without ischemic changes, without Gangrene , without cellulitis; without open wounds;    Musculoskeletal: no muscle wasting or atrophy  Neurologic: A&O X 3;  No focal weakness or paresthesias are detected; speech is fluent/normal Psychiatric:  The pt has Normal affect.  CBC    Component Value Date/Time   WBC 4.9 09/14/2017 0258   RBC 3.83 (L) 09/14/2017 0258   HGB 9.7 (L) 09/14/2017 0258   HCT 30.4 (  L) 09/14/2017 0258   PLT 241 09/14/2017 0258   MCV 79.4 09/14/2017 0258   MCH 25.3 (L) 09/14/2017 0258   MCHC 31.9 09/14/2017 0258   RDW 18.7 (H) 09/14/2017 0258    BMET    Component Value Date/Time   NA 139 09/14/2017 0258   K 4.0 09/14/2017 0258   CL 110 09/14/2017 0258   CO2 18 (L) 09/14/2017 0258   GLUCOSE 89 09/14/2017 0258   BUN 31 (H) 09/14/2017 0258   CREATININE 4.24 (H) 09/14/2017 0258   CALCIUM 8.1 (L) 09/14/2017 0258   GFRNONAA 13 (L) 09/14/2017 0258   GFRAA 15 (L) 09/14/2017 0258    COAGS: No results found for: INR, PROTIME   Non-Invasive Vascular Imaging:   MRI chest abdomen pelvis 09/13/17: IMPRESSION: Vascular:  The examination is markedly limited due to lack of intravenous contrast.  Ascending aortic graft repair is patent without evidence of complication.  Thoracic aortic dissection is noted beginning just beyond the takeoff of the left subclavian artery. Origins of the great vessels are grossly patent including the left subclavian artery  The true lumen is somewhat narrowed.  The false lumen extends from the distal arch to the mid descending thoracic aorta. I suspect that there is a combination of flow and hematoma within the false lumen. The distal and is fenestrated.  No evidence of dissection within the abdominal aorta.  Nonvascular:  Small right pleural effusion.  Small amount of free fluid adjacent to the spleen.  Small amount of pericholecystic fluid.  Statin:  No. Beta Blocker:  Yes.   Aspirin:  No. ACEI:  No. ARB:  No. CCB use:  Yes Other antiplatelets/anticoagulants:  No.    ASSESSMENT/PLAN:  This is a 32 y.o. female with hx of ascending aortic repair last year admitted this week with low back pain and thoracic aortic dissection beginning just beyond the takeoff of the left subclavian artery and uncontrolled blood pressure   -her back pain has resolved since BP is under better control -pt needs continued tight control of her blood pressure -discussed the importance of not using cocaine and quitting tobacco  -Dr. Donnetta Hutching to review MRI and see pt today -discussed possible need for dialysis access in the future.  She is right hand dominant and her left arm is restricted.    Leontine Locket, PA-C [facility] [phone]  I have examined the patient, reviewed and agree with above.  32 year old with repair of acute a sending type a aortic dissection 1 year ago at [facility].  Presented with hypertensive crisis and back pain.  Creatinine 4 and therefore could not obtain a CT angiogram.  TEE showed descending thoracic dissection with a maximal diameter of 4.  Has now undergone MRA of chest abdomen and pelvis with no contrast.  Do not have prior films for visualization so unknown with the chronicity but suspect that this is all chronic dissection.  Does not extend into her mesentery's by noncontrast study.  Her infrarenal aorta is small diameter.  Will need serial CT scans to rule out enlargement over time.  We will also need strict blood pressure control if possible.  She may well not be a candidate for endovascular repair of thoracic aneurysm and may require open repair.  Her aorta may be too small for delivery of delivery system for endovascular graft.  Will schedule repeat MRA of the chest abdomen and pelvis and office visit in 6 months.  We will not follow actively.  Creatinine is  4.  We are available for access as indicated.  Please call if we can assist  Curt Jews, MD 09/14/2017 3:34 PM

## 2017-09-14 NOTE — Progress Notes (Addendum)
Preliminary note- Renal artery duplex exam completed.  Renal arteries are patent bilaterally with abnormal high resistant waveforms with no evidence of renal stenosis. Cannot exclude of dissection of renal arteries.  Hongying Tylicia Sherman (RDMS RVT) 09/14/17 10:58 AM

## 2017-09-14 NOTE — Progress Notes (Signed)
PROGRESS NOTE    Valerie Mathis  YYT:[id number] DOB: [birth year] DOA: 09/12/2017 PCP: Patient, No Pcp Per   Brief Narrative:  32 year old African-American with a history of long-standing essential hypertension, illicit drug use, CKD stage III who was previously undergone acute type a dissection repair back in 2018 in [facility] comes to the hospital with complaints of back pain.  TEE performed here demonstrated a sending thoracic aortic graft and proximal aortic root which functionally appeared normal.  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  At this point is determined the mainstay of therapy would be blood pressure control.   Assessment & Plan:   Principal Problem:   Hypertensive emergency Active Problems:   Back pain   Tachycardia   Chronic kidney disease   Aortic dissection (HCC)  Hypertensive emergency, improved History of acute type a aortic dissection 2018, status post repair.  Appears chronic - At this point patient back pain is slightly better.  Her blood pressure is being controlled by Norvasc 10 mg daily, Coreg 25 mg twice daily, clonidine 0.3 mg orally 3 times daily, Lasix 80 mg twice daily, hydralazine 25 mg orally 3 times daily. Prn labetalol ordered too if necessary - Likely still remains evaluated while awaiting cocaine washout, this morning blood pressure was elevated but she was due for the medication.  It is trended down slowly.  Closely monitor this - I have spoken with vascular surgery  office today to ensure the consult has verbally been passed along.  Still pending vascular surgery eval - Dissection appears to be stable.  Will manage this medically with tight blood pressure control.Spoke with Dr Ricard Dillon from [facility] this morning, no further surgical indication from their standpoint for now.  -Renal ultrasound done-results pending  Chronic kidney disease stage IV -Protein  electrophoresis-pending. Hep B and C is negative. Cr Back in [place] 2018 was ~2.9. 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  Predialysis evaluation in place has eventually she will end up requiring it. Nephro following.  -Renal ultrasound is done-results are pending  Illicit drug use, polysubstance abuse -We will need outpatient rehabilitation.  Counseling.  Anemia- iron deficiency and also anemia of chronic disease secondary to CKD - IV iron given this morning, will schedule ferrous sulfate 325 mg twice daily. No obvious sign of on going blood loss.  - Saturation is less than 5%, iron levels are 14.  Ferritin not checked  DVT prophylaxis: SCDs Code Status: Full Code  Family Communication:  None at bedside  Disposition Plan: Maintain inpatient stay until better BP control.   Consultants:   Cardiology  Cardiothoracic surgery  vascular surgery  nephrology  Procedures:   TEE performed on September 12, 2017  Antimicrobials:   None   Subjective: Her blood pressure still little elevated this morning but due for her a.m. medications.  No other complaints.  Review of Systems Otherwise negative except as per HPI, including: General: Denies fever, chills, night sweats or unintended weight loss. Resp: Denies cough, wheezing, shortness of breath. Cardiac: Denies chest pain, palpitations, orthopnea, paroxysmal nocturnal dyspnea. GI: Denies abdominal pain, nausea, vomiting, diarrhea or constipation GU: Denies dysuria, frequency, hesitancy or incontinence MS: Denies muscle aches, joint pain or swelling Neuro: Denies headache, neurologic deficits (focal weakness, numbness, tingling), abnormal gait Psych: Denies anxiety, depression, SI/HI/AVH Skin: Denies new rashes or lesions ID: Denies sick contacts, exotic exposures, travel  Objective: Vitals:   09/14/17  0429 09/14/17 0500 09/14/17 0812 09/14/17 1106  BP: (!) 153/89  (!) 170/97 (!) 145/84   Pulse: 68  65 60  Resp: 16  18 18   Temp: 98.1 F (36.7 C)  98.4 F (36.9 C)   TempSrc: Oral  Oral Oral  SpO2:   100% 100%  Weight:  56.5 kg (124 lb 9 oz)    Height:        Intake/Output Summary (Last 24 hours) at 09/14/2017 1159 Last data filed at 09/14/2017 0808 Gross per 24 hour  Intake 100 ml  Output 850 ml  Net -750 ml   Filed Weights   09/12/17 1500 09/13/17 1434 09/14/17 0500  Weight: 60.3 kg (133 lb) 59.7 kg (131 lb 9.8 oz) 56.5 kg (124 lb 9 oz)    Examination:  General exam: Appears calm and comfortable  Respiratory system: Clear to auscultation. Respiratory effort normal. Cardiovascular system: S1 & S2 heard, RRR. No JVD, murmurs, rubs, gallops or clicks. No pedal edema. Gastrointestinal system: Abdomen is nondistended, soft and nontender. No organomegaly or masses felt. Normal bowel sounds heard. Central nervous system: Alert and oriented. No focal neurological deficits. Extremities: Symmetric 5 x 5 power. Skin: No rashes, lesions or ulcers Psychiatry: Judgement and insight appear normal. Mood & affect appropriate.     Data Reviewed:   CBC: Recent Labs  Lab 09/12/17 1538 09/13/17 0250 09/14/17 0258  WBC  --  4.2 4.9  HGB 12.6 9.5* 9.7*  HCT 37.0 29.4* 30.4*  MCV  --  79.2 79.4  PLT  --  236 509   Basic Metabolic Panel: Recent Labs  Lab 09/12/17 1531 09/12/17 1537 09/12/17 1538 09/13/17 0250 09/14/17 0258  NA 137  --  137 136 139  K 3.8  --  3.9 3.9 4.0  CL 107  --  109 109 110  CO2 18*  --   --  17* 18*  GLUCOSE 102*  --  101* 83 89  BUN 29*  --  33* 31* 31*  CREATININE 4.23* 4.40* 4.10* 4.43* 4.24*  CALCIUM 8.9  --   --  8.0* 8.1*  PHOS  --   --   --   --  3.8   GFR: Estimated Creatinine Clearance: 16.2 mL/min (A) (by C-G formula based on SCr of 4.24 mg/dL (H)). Liver Function Tests: Recent Labs  Lab 09/12/17 1531 09/13/17 0250 09/14/17 0258  AST 16 10*  --   ALT 10* 8*  --   ALKPHOS 187* 147*  --   BILITOT 0.8 0.7  --   PROT  6.9 5.5*  --   ALBUMIN 3.4* 2.7* 2.6*   No results for input(s): LIPASE, AMYLASE in the last 168 hours. No results for input(s): AMMONIA in the last 168 hours. Coagulation Profile: No results for input(s): INR, PROTIME in the last 168 hours. Cardiac Enzymes: Recent Labs  Lab 09/13/17 0250 09/13/17 1445  TROPONINI 0.05* 0.04*   BNP (last 3 results) No results for input(s): PROBNP in the last 8760 hours. HbA1C: No results for input(s): HGBA1C in the last 72 hours. CBG: No results for input(s): GLUCAP in the last 168 hours. Lipid Profile: No results for input(s): CHOL, HDL, LDLCALC, TRIG, CHOLHDL, LDLDIRECT in the last 72 hours. Thyroid Function Tests: Recent Labs    09/12/17 1950  TSH 1.856   Anemia Panel: Recent Labs    09/13/17 1445  TIBC 297  IRON 14*   Sepsis Labs: Recent Labs  Lab 09/12/17 1539 09/12/17 2015  LATICACIDVEN 1.16  1.15    No results found for this or any previous visit (from the past 240 hour(s)).       Radiology Studies: Dg Chest 1 View  Result Date: 09/13/2017 CLINICAL DATA:  Chest pain EXAM: CHEST  1 VIEW COMPARISON:  None. FINDINGS: There is no edema or consolidation. Heart is mildly enlarged with pulmonary vascularity within normal limits. Thoracic aorta is mildly prominent. Patient is status post median sternotomy with surgical clips in the mid chest and right upper chest regions. No pneumothorax. No bone lesions. IMPRESSION: Postoperative changes. Heart mildly enlarged. Thoracic aorta mildly prominent for age. No edema or consolidation. No pneumothorax. Electronically Signed   By: Lowella Grip III M.D.   On: 09/13/2017 14:13   Mr Jodene Nam Chest Wo Contrast  Result Date: 09/13/2017 CLINICAL DATA:  Thoracic Stanford type a aortic dissection. Status post repair. Repair performed in 2018. New onset severe back pain. Patient was evaluated by cardiology and cardiothoracic surgery after transesophageal echo. EXAM: MRA CHEST, ABDOMEN AND PELVIS  WITHOUT CONTRAST TECHNIQUE: Multiplanar, multiecho pulse sequences of the chest, abdomen, and pelvis were obtained without intravenous contrast. Angiographic images of abdomen and pelvis were obtained using MRA technique without intravenous contrast. CONTRAST:  None COMPARISON:  None. FINDINGS: The study is severely limited due to noncontrast MRA technique. Candy cane cine images are also included. Limited imaging of the vascular system is performed from the chest through the pelvis. Vascular: Thorax: Ascending aortic graft repair is difficult to delineate. The ascending aorta is normal in caliber and patent without evidence of dissection. Maximal diameter is 3.5 cm. A dissection in the thoracic aorta is present. It begins just beyond the takeoff of the left subclavian artery. The true lumen is somewhat narrowed in the distal aortic arch and proximal descending thoracic aorta. There is mixed signal intensity in the false lumen which begins just beyond the left subclavian artery and extends to the mid descending thoracic aorta. This signal may represent artifact or possibly an element of hematoma within the false lumen. This discrepancy cannot be resolved on this noncontrast study. Candy cane images demonstrate flow in the true lumen. Flow cannot be confirmed in the false lumen on the candy cane images. I suspect that there is a combination of flow and chronic mural thrombus within the false lumen. I also suspect at the distal aspect of the dissection, within the mid descending thoracic aorta is fenestrated. See images 13 and 14 of series 4. Maximal diameter of the proximal descending thoracic aorta is 4.0 cm. The origins of the innominate artery, left common carotid artery, and left subclavian artery are grossly patent. There is no obvious acute pulmonary thromboembolism. Abdomen and pelvis: There is no evidence of aortic dissection or aneurysm within the abdomen. The origins of the celiac and SMA are grossly  patent. Renal arteries are not clearly visualized. Common iliac arteries are poorly visualized but are grossly patent. Nonvascular: Thorax: There is no evidence of mediastinal mass effect. There is no obvious mediastinal hemorrhage. Small right layering pleural effusion and basilar atelectasis is noted. Abdomen of pelvis: Trace free fluid about the spleen. A small amount of pericholecystic fluid adjacent to the gallbladder is suspected in the liver spleen, pancreas, kidneys, and adrenal glands are grossly within normal limits. IMPRESSION: Vascular: The examination is markedly limited due to lack of intravenous contrast. Ascending aortic graft repair is patent without evidence of complication. Thoracic aortic dissection is noted beginning just beyond the takeoff of the left subclavian artery. Origins of  the great vessels are grossly patent including the left subclavian artery The true lumen is somewhat narrowed. The false lumen extends from the distal arch to the mid descending thoracic aorta. I suspect that there is a combination of flow and hematoma within the false lumen. The distal and is fenestrated. No evidence of dissection within the abdominal aorta. Nonvascular: Small right pleural effusion. Small amount of free fluid adjacent to the spleen. Small amount of pericholecystic fluid. Electronically Signed   By: Marybelle Killings M.D.   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 Repair performed in 2018. New onset severe back pain. Patient was evaluated by cardiology and cardiothoracic surgery after transesophageal echo. EXAM: MRA CHEST, ABDOMEN AND PELVIS WITHOUT CONTRAST TECHNIQUE: Multiplanar, multiecho pulse sequences of the chest, abdomen, and pelvis were obtained without intravenous contrast. Angiographic images of abdomen and pelvis were obtained using MRA technique without intravenous contrast.  CONTRAST:  None COMPARISON:  None. FINDINGS: The study is severely limited due to noncontrast MRA technique. Candy cane cine images are also included. Limited imaging of the vascular system is performed from the chest through the pelvis. Vascular: Thorax: Ascending aortic graft repair is difficult to delineate. The ascending aorta is normal in caliber and patent without evidence of dissection. Maximal diameter is 3.5 cm. A dissection in the thoracic aorta is present. It begins just beyond the takeoff of the left subclavian artery. The true lumen is somewhat narrowed in the distal aortic arch and proximal descending thoracic aorta. There is mixed signal intensity in the false lumen which begins just beyond the left subclavian artery and extends to the mid descending thoracic aorta. This signal may represent artifact or possibly an element of hematoma within the false lumen. This discrepancy cannot be resolved on this noncontrast study. Candy cane images demonstrate flow in the true lumen. Flow cannot be confirmed in the false lumen on the candy cane images. I suspect that there is a combination of flow and chronic mural thrombus within the false lumen. I also suspect at the distal aspect of the dissection, within the mid descending thoracic aorta is fenestrated. See images 13 and 14 of series 4. Maximal diameter of the proximal descending thoracic aorta is 4.0 cm. The origins of the innominate artery, left common carotid artery, and left subclavian artery are grossly patent. There is no obvious acute pulmonary thromboembolism. Abdomen and pelvis: There is no evidence of aortic dissection or aneurysm within the abdomen. The origins of the celiac and SMA are grossly patent. Renal arteries are not clearly visualized. Common iliac arteries are poorly visualized but are grossly patent. Nonvascular: Thorax: There is no evidence of mediastinal mass effect. There is no obvious mediastinal hemorrhage. Small right layering  pleural effusion and basilar atelectasis is noted. Abdomen of pelvis: Trace free fluid about the spleen. A small amount of pericholecystic fluid adjacent to the gallbladder is suspected in the liver spleen, pancreas, kidneys, and adrenal glands are grossly within normal limits. IMPRESSION: Vascular: The examination is markedly limited due to lack of intravenous contrast. Ascending aortic graft repair is patent without evidence of complication. Thoracic aortic dissection is noted beginning just beyond the takeoff of the left subclavian artery. Origins of the great vessels are grossly patent including the left subclavian artery The true lumen is somewhat narrowed. The false lumen extends from the distal arch to the mid descending thoracic aorta. I suspect  that there is a combination of flow and hematoma within the false lumen. The distal and is fenestrated. No evidence of dissection within the abdominal aorta. Nonvascular: Small right pleural effusion. Small amount of free fluid adjacent to the spleen. Small amount of pericholecystic fluid. Electronically Signed   By: Marybelle Killings M.D.   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 Repair performed in 2018. New onset severe back pain. Patient was evaluated by cardiology and cardiothoracic surgery after transesophageal echo. EXAM: MRA CHEST, ABDOMEN AND PELVIS WITHOUT CONTRAST TECHNIQUE: Multiplanar, multiecho pulse sequences of the chest, abdomen, and pelvis were obtained without intravenous contrast. Angiographic images of abdomen and pelvis were obtained using MRA technique without intravenous contrast. CONTRAST:  None COMPARISON:  None. FINDINGS: The study is severely limited due to noncontrast MRA technique. Candy cane cine images are also included. Limited imaging of the vascular system is performed from the chest through the pelvis. Vascular: Thorax:  Ascending aortic graft repair is difficult to delineate. The ascending aorta is normal in caliber and patent without evidence of dissection. Maximal diameter is 3.5 cm. A dissection in the thoracic aorta is present. It begins just beyond the takeoff of the left subclavian artery. The true lumen is somewhat narrowed in the distal aortic arch and proximal descending thoracic aorta. There is mixed signal intensity in the false lumen which begins just beyond the left subclavian artery and extends to the mid descending thoracic aorta. This signal may represent artifact or possibly an element of hematoma within the false lumen. This discrepancy cannot be resolved on this noncontrast study. Candy cane images demonstrate flow in the true lumen. Flow cannot be confirmed in the false lumen on the candy cane images. I suspect that there is a combination of flow and chronic mural thrombus within the false lumen. I also suspect at the distal aspect of the dissection, within the mid descending thoracic aorta is fenestrated. See images 13 and 14 of series 4. Maximal diameter of the proximal descending thoracic aorta is 4.0 cm. The origins of the innominate artery, left common carotid artery, and left subclavian artery are grossly patent. There is no obvious acute pulmonary thromboembolism. Abdomen and pelvis: There is no evidence of aortic dissection or aneurysm within the abdomen. The origins of the celiac and SMA are grossly patent. Renal arteries are not clearly visualized. Common iliac arteries are poorly visualized but are grossly patent. Nonvascular: Thorax: There is no evidence of mediastinal mass effect. There is no obvious mediastinal hemorrhage. Small right layering pleural effusion and basilar atelectasis is noted. Abdomen of pelvis: Trace free fluid about the spleen. A small amount of pericholecystic fluid adjacent to the gallbladder is suspected in the liver spleen, pancreas, kidneys, and adrenal glands are grossly  within normal limits. IMPRESSION: Vascular: The examination is markedly limited due to lack of intravenous contrast. Ascending aortic graft repair is patent without evidence of complication. Thoracic aortic dissection is noted beginning just beyond the takeoff of the left subclavian artery. Origins of the great vessels are grossly patent including the left subclavian artery The true lumen is somewhat narrowed. The false lumen extends from the distal arch to the mid descending thoracic aorta. I suspect that there is a combination of flow and hematoma within the false lumen. The distal and is fenestrated. No evidence of dissection within the abdominal aorta. Nonvascular: Small right pleural effusion. Small amount  of free fluid adjacent to the spleen. Small amount of pericholecystic fluid. Electronically Signed   By: Marybelle Killings M.D.   On: 09/13/2017 18:24   US Abdomen Complete  Result Date: 09/12/2017 CLINICAL DATA:  Abdominal and back pain. Renal disorder, hypertension. EXAM: COMPLETE ABDOMINAL ULTRASOUND COMPARISON:  None available FINDINGS: Gallbladder: Wall thickening up to 10 mm. Stones in the dependent aspect of the gallbladder measuring up to 7 mm. Trace pericholecystic fluid. The gallbladder is incompletely distended. Common bile duct:  Normal in caliber, 1.77mm diameter. Liver: Homogeneous in echotexture without focal lesion or intrahepatic bile duct dilatation. IVC:  Negative Pancreas:  Negative Spleen:  No focal lesion, craniocaudal 9cm in length. Right Kidney: No mass or hydronephrosis, echogenic compared to the adjacent liver. 9.2cm in length. Left Kidney:  No lesion or hydronephrosis, 9.8cm in length. Abdominal aorta: Fusiform aneurysm measured up to 3.2 cm in the proximal segment. Trace perisplenic ascites. IMPRESSION: 1. Cholelithiasis with thick walled nondistended gallbladder, suggestive of chronic cholecystitis. 2. Echogenic renal parenchyma, a nonspecific finding often associated with medical  renal disease. No hydronephrosis. 3. Trace abdominal ascites. 4. 3.2 cm abdominal aortic aneurysm. Recommend followup by ultrasound in 3 years. This recommendation follows ACR consensus guidelines: White Paper of the ACR Incidental Findings Committee II on Vascular Findings. [name] Coll Radiol 2013; 10:789-794 Electronically Signed   By: Lucrezia Europe M.D.   On: 09/12/2017 17:35        Scheduled Meds: . amLODipine  10 mg Oral Daily  . calcitRIOL  0.25 mcg Intravenous Daily  . carvedilol  25 mg Oral BID  . cloNIDine  0.3 mg Oral TID  . darbepoetin (ARANESP) injection - NON-DIALYSIS  60 mcg Subcutaneous Q Wed-1800  . furosemide  80 mg Oral BID  . hydrALAZINE  25 mg Oral TID   Continuous Infusions: . sodium chloride    . ferumoxytol       LOS: 2 days    Time spent: 35 mins    Toula Miyasaki Arsenio Loader, MD [facility] Pager [phone]   If 7PM-7AM, please contact night-coverage [email or web address] Password [email or web address] 09/14/2017, 11:59 AM

## 2017-09-14 NOTE — Progress Notes (Addendum)
[facility] Progress Note   Assessment/ Plan:   32 yo female with hx of HTN for 4-5 years.  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  Presenting with hypertensive emergency, BP 238/169 and chest pain.     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   Suspect could have FSG as proteinuria present.  SPEP, 24h urine Cr clearance and urine protein pending.  MRA  A/P obtained with no clear visualization of renal arteries,no evidence of dissection with abdominal aorta. Obtaining renal artery dopplers today for further evaluation.   Overall with GFR of only 10-20, will need to start considering HD and have begun this conversation with patient today.  2. HTN- overall improving.   Norvasc added to home meds on 4/30 for better control.   Currently on Norvasc 10, Coreg 25 mg BID, clonidine 0.3 mg TID, lasix 20 mg BID, hydralazine 25 mg TID w/ prn labetalol 10 mg.   Would like not to have Clonidine long term, have ^ Lasix  3. Metabolic bone disease workup: Hyperparathyroidism w/  PTH elevated to 401.  Starting po Calcitriol 0.25 mcg daily.   4. Anemia - Hgb down 9.7 from 12.6 on admission. Low iron level of 14 and sat 5% indicative of iron deficiency anemia.  Will give IV feraheme 510 mg x1 today with repeat dose in one week.  Start Aranesp 60 mcg weekly.    5. Substance abuse- likely cocaine use worsening issues.  Will require extensive counseling.   Subjective:   Patient feeling well this morning with no complaints.   No headache or blurry vision.  Denies dyspnea, CP, leg swelling.    Objective:   BP (!) 170/97 (BP Location: Right Arm)   Pulse 65   Temp 98.4 F (36.9 C) (Oral)   Resp 18   Ht 5\' 3"  (1.6 m)   Wt 124 lb 9 oz (56.5 kg)   LMP 07/15/2017 (Approximate)   SpO2 100%   BMI 22.06 kg/m   Intake/Output Summary (Last 24 hours) at 09/14/2017 0957 Last data  filed at 09/14/2017 7510 Gross per 24 hour  Intake 200 ml  Output 850 ml  Net -650 ml   Weight change: -1 lb 6.2 oz (-0.629 kg)  Physical Exam: General appearance - pleasant 32 yo F, NAD  Mental status - alert, oriented to person, place, and time  Eyes - EOMI   Mouth - MMM, o/p clear  Neck - supple, no significant adenopathy, carotids upstroke normal bilaterally, no bruit   Chest - CTAB, no wheezes, rales or rhonchi, symmetric air entry Heart - normal rate, systolic murmur present, Grade 2/6 at 2nd L intercostal space, 2+ pedal pulses LV lift Abdomen - soft, NTND, +BS  Extremities - trace pedal edema  Skin - striae   Imaging: Dg Chest 1 View  Result Date: 09/13/2017 CLINICAL DATA:  Chest pain EXAM: CHEST  1 VIEW COMPARISON:  None. FINDINGS: There is no edema or consolidation. Heart is mildly enlarged with pulmonary vascularity within normal limits. Thoracic aorta is mildly prominent. Patient is status post median sternotomy with surgical clips in the mid chest and right upper chest regions. No pneumothorax. No bone lesions. IMPRESSION: Postoperative changes. Heart mildly enlarged. Thoracic aorta mildly prominent for age. No edema or consolidation. No pneumothorax. Electronically Signed   By: Lowella Grip III M.D.   On: 09/13/2017 14:13  Mr Jodene Nam Chest Wo Contrast  Result Date: 09/13/2017 CLINICAL DATA:  Thoracic Stanford type a aortic dissection. Status post repair. Repair performed in 2018. New onset severe back pain. Patient was evaluated by cardiology and cardiothoracic surgery after transesophageal echo. EXAM: MRA CHEST, ABDOMEN AND PELVIS WITHOUT CONTRAST TECHNIQUE: Multiplanar, multiecho pulse sequences of the chest, abdomen, and pelvis were obtained without intravenous contrast. Angiographic images of abdomen and pelvis were obtained using MRA technique without intravenous contrast. CONTRAST:  None COMPARISON:  None. FINDINGS: The study is severely limited due to noncontrast MRA  technique. Candy cane cine images are also included. Limited imaging of the vascular system is performed from the chest through the pelvis. Vascular: Thorax: Ascending aortic graft repair is difficult to delineate. The ascending aorta is normal in caliber and patent without evidence of dissection. Maximal diameter is 3.5 cm. A dissection in the thoracic aorta is present. It begins just beyond the takeoff of the left subclavian artery. The true lumen is somewhat narrowed in the distal aortic arch and proximal descending thoracic aorta. There is mixed signal intensity in the false lumen which begins just beyond the left subclavian artery and extends to the mid descending thoracic aorta. This signal may represent artifact or possibly an element of hematoma within the false lumen. This discrepancy cannot be resolved on this noncontrast study. Candy cane images demonstrate flow in the true lumen. Flow cannot be confirmed in the false lumen on the candy cane images. I suspect that there is a combination of flow and chronic mural thrombus within the false lumen. I also suspect at the distal aspect of the dissection, within the mid descending thoracic aorta is fenestrated. See images 13 and 14 of series 4. Maximal diameter of the proximal descending thoracic aorta is 4.0 cm. The origins of the innominate artery, left common carotid artery, and left subclavian artery are grossly patent. There is no obvious acute pulmonary thromboembolism. Abdomen and pelvis: There is no evidence of aortic dissection or aneurysm within the abdomen. The origins of the celiac and SMA are grossly patent. Renal arteries are not clearly visualized. Common iliac arteries are poorly visualized but are grossly patent. Nonvascular: Thorax: There is no evidence of mediastinal mass effect. There is no obvious mediastinal hemorrhage. Small right layering pleural effusion and basilar atelectasis is noted. Abdomen of pelvis: Trace free fluid about the  spleen. A small amount of pericholecystic fluid adjacent to the gallbladder is suspected in the liver spleen, pancreas, kidneys, and adrenal glands are grossly within normal limits. IMPRESSION: Vascular: The examination is markedly limited due to lack of intravenous contrast. Ascending aortic graft repair is patent without evidence of complication. Thoracic aortic dissection is noted beginning just beyond the takeoff of the left subclavian artery. Origins of the great vessels are grossly patent including the left subclavian artery The true lumen is somewhat narrowed. The false lumen extends from the distal arch to the mid descending thoracic aorta. I suspect that there is a combination of flow and hematoma within the false lumen. The distal and is fenestrated. No evidence of dissection within the abdominal aorta. Nonvascular: Small right pleural effusion. Small amount of free fluid adjacent to the spleen. Small amount of pericholecystic fluid. Electronically Signed   By: Marybelle Killings M.D.   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 Repair performed in 2018. New onset severe back pain. Patient was evaluated  by cardiology and cardiothoracic surgery after transesophageal echo. EXAM: MRA CHEST, ABDOMEN AND PELVIS WITHOUT CONTRAST TECHNIQUE: Multiplanar, multiecho pulse sequences of the chest, abdomen, and pelvis were obtained without intravenous contrast. Angiographic images of abdomen and pelvis were obtained using MRA technique without intravenous contrast. CONTRAST:  None COMPARISON:  None. FINDINGS: The study is severely limited due to noncontrast MRA technique. Candy cane cine images are also included. Limited imaging of the vascular system is performed from the chest through the pelvis. Vascular: Thorax: Ascending aortic graft repair is difficult to delineate. The ascending aorta is normal in caliber  and patent without evidence of dissection. Maximal diameter is 3.5 cm. A dissection in the thoracic aorta is present. It begins just beyond the takeoff of the left subclavian artery. The true lumen is somewhat narrowed in the distal aortic arch and proximal descending thoracic aorta. There is mixed signal intensity in the false lumen which begins just beyond the left subclavian artery and extends to the mid descending thoracic aorta. This signal may represent artifact or possibly an element of hematoma within the false lumen. This discrepancy cannot be resolved on this noncontrast study. Candy cane images demonstrate flow in the true lumen. Flow cannot be confirmed in the false lumen on the candy cane images. I suspect that there is a combination of flow and chronic mural thrombus within the false lumen. I also suspect at the distal aspect of the dissection, within the mid descending thoracic aorta is fenestrated. See images 13 and 14 of series 4. Maximal diameter of the proximal descending thoracic aorta is 4.0 cm. The origins of the innominate artery, left common carotid artery, and left subclavian artery are grossly patent. There is no obvious acute pulmonary thromboembolism. Abdomen and pelvis: There is no evidence of aortic dissection or aneurysm within the abdomen. The origins of the celiac and SMA are grossly patent. Renal arteries are not clearly visualized. Common iliac arteries are poorly visualized but are grossly patent. Nonvascular: Thorax: There is no evidence of mediastinal mass effect. There is no obvious mediastinal hemorrhage. Small right layering pleural effusion and basilar atelectasis is noted. Abdomen of pelvis: Trace free fluid about the spleen. A small amount of pericholecystic fluid adjacent to the gallbladder is suspected in the liver spleen, pancreas, kidneys, and adrenal glands are grossly within normal limits. IMPRESSION: Vascular: The examination is markedly limited due to lack of  intravenous contrast. Ascending aortic graft repair is patent without evidence of complication. Thoracic aortic dissection is noted beginning just beyond the takeoff of the left subclavian artery. Origins of the great vessels are grossly patent including the left subclavian artery The true lumen is somewhat narrowed. The false lumen extends from the distal arch to the mid descending thoracic aorta. I suspect that there is a combination of flow and hematoma within the false lumen. The distal and is fenestrated. No evidence of dissection within the abdominal aorta. Nonvascular: Small right pleural effusion. Small amount of free fluid adjacent to the spleen. Small amount of pericholecystic fluid. Electronically Signed   By: Marybelle Killings M.D.   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 Repair performed in 2018. New onset severe back pain. Patient was evaluated by cardiology and cardiothoracic surgery after transesophageal echo. EXAM: MRA CHEST, ABDOMEN AND PELVIS WITHOUT CONTRAST TECHNIQUE: Multiplanar, multiecho pulse sequences of the chest, abdomen, and pelvis were obtained without intravenous contrast. Angiographic  images of abdomen and pelvis were obtained using MRA technique without intravenous contrast. CONTRAST:  None COMPARISON:  None. FINDINGS: The study is severely limited due to noncontrast MRA technique. Candy cane cine images are also included. Limited imaging of the vascular system is performed from the chest through the pelvis. Vascular: Thorax: Ascending aortic graft repair is difficult to delineate. The ascending aorta is normal in caliber and patent without evidence of dissection. Maximal diameter is 3.5 cm. A dissection in the thoracic aorta is present. It begins just beyond the takeoff of the left subclavian artery. The true lumen is somewhat narrowed in the distal aortic arch and  proximal descending thoracic aorta. There is mixed signal intensity in the false lumen which begins just beyond the left subclavian artery and extends to the mid descending thoracic aorta. This signal may represent artifact or possibly an element of hematoma within the false lumen. This discrepancy cannot be resolved on this noncontrast study. Candy cane images demonstrate flow in the true lumen. Flow cannot be confirmed in the false lumen on the candy cane images. I suspect that there is a combination of flow and chronic mural thrombus within the false lumen. I also suspect at the distal aspect of the dissection, within the mid descending thoracic aorta is fenestrated. See images 13 and 14 of series 4. Maximal diameter of the proximal descending thoracic aorta is 4.0 cm. The origins of the innominate artery, left common carotid artery, and left subclavian artery are grossly patent. There is no obvious acute pulmonary thromboembolism. Abdomen and pelvis: There is no evidence of aortic dissection or aneurysm within the abdomen. The origins of the celiac and SMA are grossly patent. Renal arteries are not clearly visualized. Common iliac arteries are poorly visualized but are grossly patent. Nonvascular: Thorax: There is no evidence of mediastinal mass effect. There is no obvious mediastinal hemorrhage. Small right layering pleural effusion and basilar atelectasis is noted. Abdomen of pelvis: Trace free fluid about the spleen. A small amount of pericholecystic fluid adjacent to the gallbladder is suspected in the liver spleen, pancreas, kidneys, and adrenal glands are grossly within normal limits. IMPRESSION: Vascular: The examination is markedly limited due to lack of intravenous contrast. Ascending aortic graft repair is patent without evidence of complication. Thoracic aortic dissection is noted beginning just beyond the takeoff of the left subclavian artery. Origins of the great vessels are grossly patent including  the left subclavian artery The true lumen is somewhat narrowed. The false lumen extends from the distal arch to the mid descending thoracic aorta. I suspect that there is a combination of flow and hematoma within the false lumen. The distal and is fenestrated. No evidence of dissection within the abdominal aorta. Nonvascular: Small right pleural effusion. Small amount of free fluid adjacent to the spleen. Small amount of pericholecystic fluid. Electronically Signed   By: Marybelle Killings M.D.   On: 09/13/2017 18:24   US Abdomen Complete  Result Date: 09/12/2017 CLINICAL DATA:  Abdominal and back pain. Renal disorder, hypertension. EXAM: COMPLETE ABDOMINAL ULTRASOUND COMPARISON:  None available FINDINGS: Gallbladder: Wall thickening up to 10 mm. Stones in the dependent aspect of the gallbladder measuring up to 7 mm. Trace pericholecystic fluid. The gallbladder is incompletely distended. Common bile duct:  Normal in caliber, 1.22mm diameter. Liver: Homogeneous in echotexture without focal lesion or intrahepatic bile duct dilatation. IVC:  Negative Pancreas:  Negative Spleen:  No focal lesion, craniocaudal 9cm in length. Right Kidney: No mass or hydronephrosis, echogenic compared to  the adjacent liver. 9.2cm in length. Left Kidney:  No lesion or hydronephrosis, 9.8cm in length. Abdominal aorta: Fusiform aneurysm measured up to 3.2 cm in the proximal segment. Trace perisplenic ascites. IMPRESSION: 1. Cholelithiasis with thick walled nondistended gallbladder, suggestive of chronic cholecystitis. 2. Echogenic renal parenchyma, a nonspecific finding often associated with medical renal disease. No hydronephrosis. 3. Trace abdominal ascites. 4. 3.2 cm abdominal aortic aneurysm. Recommend followup by ultrasound in 3 years. This recommendation follows ACR consensus guidelines: White Paper of the ACR Incidental Findings Committee II on Vascular Findings. [name] Coll Radiol 2013; 10:789-794 Electronically Signed   By: Lucrezia Europe  M.D.   On: 09/12/2017 17:35    Labs: BMET Recent Labs  Lab 09/12/17 1531 09/12/17 1537 09/12/17 1538 09/13/17 0250 09/14/17 0258  NA 137  --  137 136 139  K 3.8  --  3.9 3.9 4.0  CL 107  --  109 109 110  CO2 18*  --   --  17* 18*  GLUCOSE 102*  --  101* 83 89  BUN 29*  --  33* 31* 31*  CREATININE 4.23* 4.40* 4.10* 4.43* 4.24*  CALCIUM 8.9  --   --  8.0* 8.1*  PHOS  --   --   --   --  3.8   CBC Recent Labs  Lab 09/12/17 1538 09/13/17 0250 09/14/17 0258  WBC  --  4.2 4.9  HGB 12.6 9.5* 9.7*  HCT 37.0 29.4* 30.4*  MCV  --  79.2 79.4  PLT  --  236 241    Medications:    . amLODipine  10 mg Oral Daily  . carvedilol  25 mg Oral BID  . cloNIDine  0.3 mg Oral TID  . furosemide  80 mg Oral BID  . hydrALAZINE  25 mg Oral TID    Lovenia Kim, MD [place], PGY-2 I have seen and examined this patient and agree with the plan of care seen, examined, counseled patient, and discussed with resident.  Will need to prepare for ESRD .  Jeneen Rinks Sophy Mesler 09/14/2017, 2:52 PM

## 2017-09-15 ENCOUNTER — Telehealth: Payer: Self-pay | Admitting: Vascular Surgery

## 2017-09-15 DIAGNOSIS — N184 Chronic kidney disease, stage 4 (severe): Secondary | ICD-10-CM

## 2017-09-15 DIAGNOSIS — I472 Ventricular tachycardia: Secondary | ICD-10-CM

## 2017-09-15 LAB — RENAL FUNCTION PANEL
ALBUMIN: 2.9 g/dL — AB (ref 3.5–5.0)
ANION GAP: 10 (ref 5–15)
BUN: 29 mg/dL — AB (ref 6–20)
CALCIUM: 8.4 mg/dL — AB (ref 8.9–10.3)
CO2: 21 mmol/L — ABNORMAL LOW (ref 22–32)
CREATININE: 4.3 mg/dL — AB (ref 0.44–1.00)
Chloride: 108 mmol/L (ref 101–111)
GFR calc Af Amer: 15 mL/min — ABNORMAL LOW (ref >60)
GFR calc non Af Amer: 13 mL/min — ABNORMAL LOW (ref >60)
GLUCOSE: 103 mg/dL — AB (ref 65–99)
PHOSPHORUS: 3.8 mg/dL (ref 2.5–4.6)
Potassium: 3.7 mmol/L (ref 3.5–5.1)
SODIUM: 139 mmol/L (ref 135–145)

## 2017-09-15 MED ORDER — CARVEDILOL 25 MG PO TABS: 25.0000 mg | ORAL_TABLET | Freq: Two times a day (BID) | ORAL | 1 refills | Status: DC

## 2017-09-15 MED ORDER — AMLODIPINE BESYLATE 10 MG PO TABS: 10.0000 mg | ORAL_TABLET | Freq: Every day | ORAL | 1 refills | Status: DC

## 2017-09-15 MED ORDER — HYDRALAZINE HCL 25 MG PO TABS: 25.0000 mg | ORAL_TABLET | Freq: Three times a day (TID) | ORAL | 1 refills | Status: DC

## 2017-09-15 MED ORDER — HYDRALAZINE HCL 50 MG PO TABS: 50.0000 mg | ORAL_TABLET | Freq: Three times a day (TID) | ORAL | 0 refills | Status: DC

## 2017-09-15 MED ORDER — CLONIDINE HCL 0.3 MG PO TABS: 0.3000 mg | ORAL_TABLET | Freq: Three times a day (TID) | ORAL | 1 refills | Status: DC

## 2017-09-15 MED ORDER — FERROUS SULFATE 325 (65 FE) MG PO TABS: 325.0000 mg | ORAL_TABLET | Freq: Two times a day (BID) | ORAL | 1 refills | Status: DC

## 2017-09-15 MED ORDER — CALCITRIOL 0.25 MCG PO CAPS: 0.2500 ug | ORAL_CAPSULE | Freq: Every day | ORAL | 1 refills | Status: DC

## 2017-09-15 MED ORDER — FUROSEMIDE 20 MG PO TABS: 20.0000 mg | ORAL_TABLET | Freq: Two times a day (BID) | ORAL | 1 refills | Status: DC

## 2017-09-15 MED ORDER — ISOSORBIDE MONONITRATE ER 30 MG PO TB24: 30.0000 mg | ORAL_TABLET | Freq: Every day | ORAL | 1 refills | Status: DC

## 2017-09-15 MED ORDER — HYDRALAZINE HCL 50 MG PO TABS: 50.0000 mg | ORAL_TABLET | Freq: Three times a day (TID) | ORAL | Status: DC

## 2017-09-15 MED ORDER — POLYETHYLENE GLYCOL 3350 17 G PO PACK: 17.0000 g | PACK | Freq: Every day | ORAL | 1 refills | Status: DC | PRN

## 2017-09-15 NOTE — Progress Notes (Addendum)
[facility] Progress Note    Assessment/ Plan:   32 yo female with hx of HTN for 4-5 years.  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  Presenting with hypertensive emergency, BP 238/169 and chest pain.      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  Working up for suspecting FSG as proteinuria present on labs.  SPEP with total protein 5.5, albumin 2.8, and no globulins or M spike to suggest this is a dysproteinemia.  24h Cr 770 and within normal range. Her 24h protein is significantly elevated to 1403.  Renal dopplers without evidence of RAS and no evidence of renal artery involvement in aortic dissection.  Patient will need HD and can follow up as outpatient for fistula.     2. HTN- overall improved. Would not recommend tight control of blood pressures in her situation so as to maintain good perfusion to kidney.  Plan to discharge with Clonidine, however would not like this for long-term BP control.    3. Metabolic bone disease workup: Hyperparathyroidism. Continue po calcitriol 0.25 mcg daily.   4. Anemia - Iron deficiency anemia.  s/p IV feraheme 510 mg x1 on 5/1 with plan to repeat dose in one week. Continue Aranesp 60 mcg weekly.    5. Substance abuse- likely cocaine use worsening issues. Counseled patient.   Ok to discharge from nephrology standpoint, can follow up in clinic with Dr. Jimmy Footman as outpatient in 1-2 weeks. Discussed with hospitalist Dr. Reesa Chew this morning.   Subjective:   Patient eating breakfast this morning- denies any current complaints.  No CP, SOB or palpitations.  No dizziness, blurry vision or headache.     Objective:   BP (!) 142/101 (BP Location: Right Arm)   Pulse 65   Temp 97.9 F (36.6 C) (Oral)   Resp 17   Ht 5\' 3"  (1.6 m)   Wt 119 lb 8 oz (54.2 kg)   LMP 07/15/2017 (Approximate)   SpO2 100%   BMI 21.17 kg/m    Intake/Output Summary (Last 24 hours) at 09/15/2017 0814 Last data filed at 09/15/2017 0446 Gross per 24 hour  Intake 480 ml  Output 4400 ml  Net -3920 ml   Weight change: -12 lb 1.8 oz (-5.495 kg)  Physical Exam: General appearance -32 yo F, alert and oriented x3, NAD  Eyes - EOMI   Mouth - MMM, o/p clear  Neck -supple, no LAD, no bruit  Chest -CTAB, normal effort, no wheeze  Heart -normal rate, systolic murmurpresent, Grade 2/6at 2nd L intercostal space, 2+ pedal pulses LV lift Abdomen -soft, NTND, +BS  Extremities -trace pedal edema  Skin - striae   Imaging: Dg Chest 1 View  Result Date: 09/13/2017 CLINICAL DATA:  Chest pain EXAM: CHEST  1 VIEW COMPARISON:  None. FINDINGS: There is no edema or consolidation. Heart is mildly enlarged with pulmonary vascularity within normal limits. Thoracic aorta is mildly prominent. Patient is status post median sternotomy with surgical clips in the mid chest and right upper chest regions. No pneumothorax. No bone lesions. IMPRESSION: Postoperative changes. Heart mildly enlarged. Thoracic aorta mildly prominent for age. No edema or consolidation. No pneumothorax. Electronically Signed   By: Lowella Grip III M.D.   On: 09/13/2017 14:13   Mr Jodene Nam Chest Wo Contrast  Result Date: 09/13/2017 CLINICAL DATA:  Thoracic Stanford type a aortic dissection. Status post repair. Repair  performed in 2018. New onset severe back pain. Patient was evaluated by cardiology and cardiothoracic surgery after transesophageal echo. EXAM: MRA CHEST, ABDOMEN AND PELVIS WITHOUT CONTRAST TECHNIQUE: Multiplanar, multiecho pulse sequences of the chest, abdomen, and pelvis were obtained without intravenous contrast. Angiographic images of abdomen and pelvis were obtained using MRA technique without intravenous contrast. CONTRAST:  None COMPARISON:  None. FINDINGS: The study is severely limited due to noncontrast MRA technique. Candy cane cine images are also included.  Limited imaging of the vascular system is performed from the chest through the pelvis. Vascular: Thorax: Ascending aortic graft repair is difficult to delineate. The ascending aorta is normal in caliber and patent without evidence of dissection. Maximal diameter is 3.5 cm. A dissection in the thoracic aorta is present. It begins just beyond the takeoff of the left subclavian artery. The true lumen is somewhat narrowed in the distal aortic arch and proximal descending thoracic aorta. There is mixed signal intensity in the false lumen which begins just beyond the left subclavian artery and extends to the mid descending thoracic aorta. This signal may represent artifact or possibly an element of hematoma within the false lumen. This discrepancy cannot be resolved on this noncontrast study. Candy cane images demonstrate flow in the true lumen. Flow cannot be confirmed in the false lumen on the candy cane images. I suspect that there is a combination of flow and chronic mural thrombus within the false lumen. I also suspect at the distal aspect of the dissection, within the mid descending thoracic aorta is fenestrated. See images 13 and 14 of series 4. Maximal diameter of the proximal descending thoracic aorta is 4.0 cm. The origins of the innominate artery, left common carotid artery, and left subclavian artery are grossly patent. There is no obvious acute pulmonary thromboembolism. Abdomen and pelvis: There is no evidence of aortic dissection or aneurysm within the abdomen. The origins of the celiac and SMA are grossly patent. Renal arteries are not clearly visualized. Common iliac arteries are poorly visualized but are grossly patent. Nonvascular: Thorax: There is no evidence of mediastinal mass effect. There is no obvious mediastinal hemorrhage. Small right layering pleural effusion and basilar atelectasis is noted. Abdomen of pelvis: Trace free fluid about the spleen. A small amount of pericholecystic fluid adjacent  to the gallbladder is suspected in the liver spleen, pancreas, kidneys, and adrenal glands are grossly within normal limits. IMPRESSION: Vascular: The examination is markedly limited due to lack of intravenous contrast. Ascending aortic graft repair is patent without evidence of complication. Thoracic aortic dissection is noted beginning just beyond the takeoff of the left subclavian artery. Origins of the great vessels are grossly patent including the left subclavian artery The true lumen is somewhat narrowed. The false lumen extends from the distal arch to the mid descending thoracic aorta. I suspect that there is a combination of flow and hematoma within the false lumen. The distal and is fenestrated. No evidence of dissection within the abdominal aorta. Nonvascular: Small right pleural effusion. Small amount of free fluid adjacent to the spleen. Small amount of pericholecystic fluid. Electronically Signed   By: Marybelle Killings M.D.   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 Repair performed in 2018. New onset severe back pain. Patient was evaluated by cardiology and cardiothoracic surgery after transesophageal echo. EXAM: MRA CHEST, ABDOMEN AND PELVIS WITHOUT CONTRAST TECHNIQUE: Multiplanar, multiecho pulse sequences of  the chest, abdomen, and pelvis were obtained without intravenous contrast. Angiographic images of abdomen and pelvis were obtained using MRA technique without intravenous contrast. CONTRAST:  None COMPARISON:  None. FINDINGS: The study is severely limited due to noncontrast MRA technique. Candy cane cine images are also included. Limited imaging of the vascular system is performed from the chest through the pelvis. Vascular: Thorax: Ascending aortic graft repair is difficult to delineate. The ascending aorta is normal in caliber and patent without evidence of dissection. Maximal  diameter is 3.5 cm. A dissection in the thoracic aorta is present. It begins just beyond the takeoff of the left subclavian artery. The true lumen is somewhat narrowed in the distal aortic arch and proximal descending thoracic aorta. There is mixed signal intensity in the false lumen which begins just beyond the left subclavian artery and extends to the mid descending thoracic aorta. This signal may represent artifact or possibly an element of hematoma within the false lumen. This discrepancy cannot be resolved on this noncontrast study. Candy cane images demonstrate flow in the true lumen. Flow cannot be confirmed in the false lumen on the candy cane images. I suspect that there is a combination of flow and chronic mural thrombus within the false lumen. I also suspect at the distal aspect of the dissection, within the mid descending thoracic aorta is fenestrated. See images 13 and 14 of series 4. Maximal diameter of the proximal descending thoracic aorta is 4.0 cm. The origins of the innominate artery, left common carotid artery, and left subclavian artery are grossly patent. There is no obvious acute pulmonary thromboembolism. Abdomen and pelvis: There is no evidence of aortic dissection or aneurysm within the abdomen. The origins of the celiac and SMA are grossly patent. Renal arteries are not clearly visualized. Common iliac arteries are poorly visualized but are grossly patent. Nonvascular: Thorax: There is no evidence of mediastinal mass effect. There is no obvious mediastinal hemorrhage. Small right layering pleural effusion and basilar atelectasis is noted. Abdomen of pelvis: Trace free fluid about the spleen. A small amount of pericholecystic fluid adjacent to the gallbladder is suspected in the liver spleen, pancreas, kidneys, and adrenal glands are grossly within normal limits. IMPRESSION: Vascular: The examination is markedly limited due to lack of intravenous contrast. Ascending aortic graft repair is  patent without evidence of complication. Thoracic aortic dissection is noted beginning just beyond the takeoff of the left subclavian artery. Origins of the great vessels are grossly patent including the left subclavian artery The true lumen is somewhat narrowed. The false lumen extends from the distal arch to the mid descending thoracic aorta. I suspect that there is a combination of flow and hematoma within the false lumen. The distal and is fenestrated. No evidence of dissection within the abdominal aorta. Nonvascular: Small right pleural effusion. Small amount of free fluid adjacent to the spleen. Small amount of pericholecystic fluid. Electronically Signed   By: Marybelle Killings M.D.   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 Repair performed in 2018. New onset severe back pain. Patient was evaluated by cardiology and cardiothoracic surgery after transesophageal echo. EXAM: MRA CHEST, ABDOMEN AND PELVIS WITHOUT CONTRAST TECHNIQUE: Multiplanar, multiecho pulse sequences of the chest, abdomen, and pelvis were obtained without intravenous contrast. Angiographic images of abdomen and pelvis were obtained using MRA technique without intravenous contrast. CONTRAST:  None COMPARISON:  None. FINDINGS: The study  is severely limited due to noncontrast MRA technique. Candy cane cine images are also included. Limited imaging of the vascular system is performed from the chest through the pelvis. Vascular: Thorax: Ascending aortic graft repair is difficult to delineate. The ascending aorta is normal in caliber and patent without evidence of dissection. Maximal diameter is 3.5 cm. A dissection in the thoracic aorta is present. It begins just beyond the takeoff of the left subclavian artery. The true lumen is somewhat narrowed in the distal aortic arch and proximal descending thoracic aorta. There is mixed signal  intensity in the false lumen which begins just beyond the left subclavian artery and extends to the mid descending thoracic aorta. This signal may represent artifact or possibly an element of hematoma within the false lumen. This discrepancy cannot be resolved on this noncontrast study. Candy cane images demonstrate flow in the true lumen. Flow cannot be confirmed in the false lumen on the candy cane images. I suspect that there is a combination of flow and chronic mural thrombus within the false lumen. I also suspect at the distal aspect of the dissection, within the mid descending thoracic aorta is fenestrated. See images 13 and 14 of series 4. Maximal diameter of the proximal descending thoracic aorta is 4.0 cm. The origins of the innominate artery, left common carotid artery, and left subclavian artery are grossly patent. There is no obvious acute pulmonary thromboembolism. Abdomen and pelvis: There is no evidence of aortic dissection or aneurysm within the abdomen. The origins of the celiac and SMA are grossly patent. Renal arteries are not clearly visualized. Common iliac arteries are poorly visualized but are grossly patent. Nonvascular: Thorax: There is no evidence of mediastinal mass effect. There is no obvious mediastinal hemorrhage. Small right layering pleural effusion and basilar atelectasis is noted. Abdomen of pelvis: Trace free fluid about the spleen. A small amount of pericholecystic fluid adjacent to the gallbladder is suspected in the liver spleen, pancreas, kidneys, and adrenal glands are grossly within normal limits. IMPRESSION: Vascular: The examination is markedly limited due to lack of intravenous contrast. Ascending aortic graft repair is patent without evidence of complication. Thoracic aortic dissection is noted beginning just beyond the takeoff of the left subclavian artery. Origins of the great vessels are grossly patent including the left subclavian artery The true lumen is somewhat  narrowed. The false lumen extends from the distal arch to the mid descending thoracic aorta. I suspect that there is a combination of flow and hematoma within the false lumen. The distal and is fenestrated. No evidence of dissection within the abdominal aorta. Nonvascular: Small right pleural effusion. Small amount of free fluid adjacent to the spleen. Small amount of pericholecystic fluid. Electronically Signed   By: Marybelle Killings M.D.   On: 09/13/2017 18:24    Labs: BMET Recent Labs  Lab 09/12/17 1531 09/12/17 1537 09/12/17 1538 09/13/17 0250 09/14/17 0258 09/15/17 0317  NA 137  --  137 136 139 139  K 3.8  --  3.9 3.9 4.0 3.7  CL 107  --  109 109 110 108  CO2 18*  --   --  17* 18* 21*  GLUCOSE 102*  --  101* 83 89 103*  BUN 29*  --  33* 31* 31* 29*  CREATININE 4.23* 4.40* 4.10* 4.43* 4.24* 4.30*  CALCIUM 8.9  --   --  8.0* 8.1* 8.4*  PHOS  --   --   --   --  3.8 3.8   CBC Recent  Labs  Lab 09/12/17 1538 09/13/17 0250 09/14/17 0258  WBC  --  4.2 4.9  HGB 12.6 9.5* 9.7*  HCT 37.0 29.4* 30.4*  MCV  --  79.2 79.4  PLT  --  236 241    Medications:    . amLODipine  10 mg Oral Daily  . calcitRIOL  0.25 mcg Oral Daily  . carvedilol  25 mg Oral BID  . cloNIDine  0.3 mg Oral TID  . darbepoetin (ARANESP) injection - NON-DIALYSIS  60 mcg Subcutaneous Q Wed-1800  . ferrous sulfate  325 mg Oral BID WC  . furosemide  80 mg Oral BID  . hydrALAZINE  25 mg Oral TID  . isosorbide mononitrate  30 mg Oral Daily     Lovenia Kim MD [place], PGY-2  I have seen and examined this patient and agree with the plan of care seen, eval, examined, counseled, and discussed with resident and primary MD. .  Jeneen Rinks Itsel Opfer 09/15/2017, 2:51 PM

## 2017-09-15 NOTE — Consult Note (Signed)
Hospital Consult    Reason for Consult:  Aortic dissection Requesting Physician:  Reesa Chew MRN #:  [id number]  History of Present Illness: This is a 32 y.o. female who was admitted Monday with lower back pain that started last Tuesday.  She states that it got worse over the course of the week and that is why she came to the hospital.  Sitting up would make it better and laying down would make it worse.  She has a hx of ascending aortic repair last year in [place].  Upon presentation to the hospital this week, she was in hypertensive crisis with BP of 257/174. She states she did use cocaine.  She uses tobacco.  She states she did not take her blood pressure medicine that day.  She states now that her BP is under better control, her pain has resolved.  She states she has a hx of heart failure, kidney disease.  She has CKD 4.  She states she has a hx of a blood clot in her left arm and was on coumadin.  She is not longer on coumadin.  She denies hx of stroke.   She is on a CCB, BB and clonidine for blood pressure management.  She also takes lasix and hydralazine.    She had surgery last year for an ectopic pregnancy.  She has 2 children.  She smokes cigarettes.  She is right hand dominant.    Past Medical History:  Diagnosis Date  . Aortic dissection ([facility])   . CHF (congestive heart failure) ([place])   . CKD (chronic kidney disease) stage 4, GFR 15-29 ml/min ([facility])   . H/O: alcohol abuse   . Hypertension   . Renal disorder   . 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 ECTOPIC PREGNANCY SURGERY  2018  . REPAIR OF ACUTE ASCENDING THORACIC AORTIC DISSECTION      No Known Allergies  Prior to Admission medications   Medication Sig Start Date End Date Taking? Authorizing Provider  amLODipine (NORVASC) 10 MG tablet Take 10 mg by mouth daily. for high blood pressure 07/21/17  Yes [provider]  carvedilol (COREG) 25 MG tablet  Take 25 mg by mouth 2 (two) times daily. For blood pressure/heart 07/21/17  Yes [provider]  cloNIDine (CATAPRES) 0.3 MG tablet Take 0.3 mg by mouth 3 (three) times daily. For high blood pressure 07/21/17  Yes [provider]  furosemide (LASIX) 20 MG tablet Take 20 mg by mouth 2 (two) times daily. For high blood pressure/fluid 07/21/17  Yes [provider]  hydrALAZINE (APRESOLINE) 25 MG tablet Take 25 mg by mouth 3 (three) times daily. For high blood pressure 07/21/17  Yes [provider]    Social History   Socioeconomic History  . Marital status: Single    Spouse name: Not on file  . Number of children: Not on file  . Years of education: Not on file  . Highest education level: Not on file  Occupational History  . Not on file  Social Needs  . Financial resource strain: Not on file  . Food insecurity:    Worry: Not on file    Inability: Not on file  . Transportation needs:    Medical: Not on file    Non-medical: Not on file  Tobacco Use  . Smoking status: Current Every Day Smoker    Packs/day: 0.50    Years: 4.00    Pack  years: 2.00    Types: Cigarettes  . Smokeless tobacco: Never Used  Substance and Sexual Activity  . Alcohol use: Yes    Comment: OCCASIONAL  . Drug use: Yes    Types: Cocaine, Benzodiazepines  . Sexual activity: Yes  Lifestyle  . Physical activity:    Days per week: Not on file    Minutes per session: Not on file  . Stress: Not on file  Relationships  . Social connections:    Talks on phone: Not on file    Gets together: Not on file    Attends religious service: Not on file    Active member of club or organization: Not on file    Attends meetings of clubs or organizations: Not on file    Relationship status: Not on file  . Intimate partner violence:    Fear of current or ex partner: Not on file    Emotionally abused: Not on file    Physically abused: Not on file    Forced sexual activity: Not on file  Other  Topics Concern  . Not on file  Social History Narrative  . Not on file     Family History  Problem Relation Age of Onset  . Hypertension Mother     ROS: [x]  Positive   [ ]  Negative   [ ]  All sytems reviewed and are negative  Cardiac: []  chest pain/pressure [x]  back pain [x]  high blood pressure []  palpitations []  SOB lying flat []  DOE  Vascular: []  pain in legs while walking []  pain in legs at rest []  pain in legs at night []  non-healing ulcers [x]  hx of DVT-left arm []  swelling in legs  Pulmonary: []  productive cough []  asthma/wheezing []  home O2  Neurologic: []  weakness in []  arms []  legs []  numbness in []  arms []  legs []  hx of CVA []  mini stroke [] difficulty speaking or slurred speech []  temporary loss of vision in one eye []  dizziness  Hematologic: []  hx of cancer []  bleeding problems []  problems with blood clotting easily  Endocrine:   []  diabetes []  thyroid disease  GI []  vomiting blood []  blood in stool  GU: [x]  CKD/renal failure []  HD--[]  M/W/F or []  T/T/S []  blood in urine  Psychiatric: []  anxiety []  depression  Musculoskeletal: []  arthritis []  joint pain  Integumentary: []  rashes []  ulcers  Constitutional: []  fever []  chills   Physical Examination  Vitals:   09/14/17 2356 09/15/17 0445  BP: (!) 140/92 (!) 142/101  Pulse: (!) 59 65  Resp: 17 (!) 23  Temp: 97.9 F (36.6 C) 97.9 F (36.6 C)  SpO2: 99% 100%   Body mass index is 21.17 kg/m.  General:  WDWN in NAD Gait: Not observed HENT: WNL, normocephalic Pulmonary: normal non-labored breathing, without Rales, rhonchi,  wheezing Cardiac: regular, with  Murmur; without carotid bruits Abdomen:  soft, NT/ND, no masses Skin: without rashes Vascular Exam/Pulses:  Right Left  Radial 2+ (normal) 2+ (normal)  Ulnar 1+ (weak) 1+ (weak)  DP 2+ (normal) 2+ (normal)  PT 1+ (weak) 1+ (weak)   Extremities: without ischemic changes, without Gangrene , without cellulitis;  without open wounds;  Musculoskeletal: no muscle wasting or atrophy  Neurologic: A&O X 3;  No focal weakness or paresthesias are detected; speech is fluent/normal Psychiatric:  The pt has Normal affect.  CBC    Component Value Date/Time   WBC 4.9 09/14/2017 0258   RBC 3.83 (L) 09/14/2017 0258   HGB 9.7 (L) 09/14/2017 0258  HCT 30.4 (L) 09/14/2017 0258   PLT 241 09/14/2017 0258   MCV 79.4 09/14/2017 0258   MCH 25.3 (L) 09/14/2017 0258   MCHC 31.9 09/14/2017 0258   RDW 18.7 (H) 09/14/2017 0258    BMET    Component Value Date/Time   NA 139 09/15/2017 0317   K 3.7 09/15/2017 0317   CL 108 09/15/2017 0317   CO2 21 (L) 09/15/2017 0317   GLUCOSE 103 (H) 09/15/2017 0317   BUN 29 (H) 09/15/2017 0317   CREATININE 4.30 (H) 09/15/2017 0317   CALCIUM 8.4 (L) 09/15/2017 0317   GFRNONAA 13 (L) 09/15/2017 0317   GFRAA 15 (L) 09/15/2017 0317    COAGS: No results found for: INR, PROTIME   Non-Invasive Vascular Imaging:   MRI chest abdomen pelvis 09/13/17: IMPRESSION: Vascular:  The examination is markedly limited due to lack of intravenous contrast.  Ascending aortic graft repair is patent without evidence of complication.  Thoracic aortic dissection is noted beginning just beyond the takeoff of the left subclavian artery. Origins of the great vessels are grossly patent including the left subclavian artery  The true lumen is somewhat narrowed.  The false lumen extends from the distal arch to the mid descending thoracic aorta. I suspect that there is a combination of flow and hematoma within the false lumen. The distal and is fenestrated.  No evidence of dissection within the abdominal aorta.  Nonvascular:  Small right pleural effusion.  Small amount of free fluid adjacent to the spleen.  Small amount of pericholecystic fluid.  Statin:  No. Beta Blocker:  Yes.   Aspirin:  No. ACEI:  No. ARB:  No. CCB use:  Yes Other antiplatelets/anticoagulants:  No.      ASSESSMENT/PLAN: This is a 32 y.o. female with hx of ascending aortic repair last year admitted this week with low back pain and thoracic aortic dissection beginning just beyond the takeoff of the left subclavian artery and uncontrolled blood pressure   -her back pain has resolved since BP is under better control -pt needs continued tight control of her blood pressure -discussed the importance of not using cocaine and quitting tobacco  -Dr. Donnetta Hutching to review MRI and see pt today -discussed possible need for dialysis access in the future.  She is right hand dominant and her left arm is restricted.    Leontine Locket, PA-C [facility] [phone]  I have examined the patient, reviewed and agree with above.  32 year old with repair of acute a sending type a aortic dissection 1 year ago at [facility].  Presented with hypertensive crisis and back pain.  Creatinine 4 and therefore could not obtain a CT angiogram.  TEE showed descending thoracic dissection with a maximal diameter of 4.  Has now undergone MRA of chest abdomen and pelvis with no contrast.  Do not have prior films for visualization so unknown with the chronicity but suspect that this is all chronic dissection.  Does not extend into her mesentery's by noncontrast study.  Her infrarenal aorta is small diameter.  Will need serial CT scans to rule out enlargement over time.  We will also need strict blood pressure control if possible.  She may well not be a candidate for endovascular repair of thoracic aneurysm and may require open repair.  Her aorta may be too small for delivery of delivery system for endovascular graft.  Will schedule repeat MRA of the chest abdomen and pelvis and office visit in 6 months.  We will not follow  actively.  Creatinine is 4.  We are available for access as indicated.  Please call if we can assist  Leontine Locket, PA-C 09/15/2017 7:18 AM

## 2017-09-15 NOTE — Progress Notes (Signed)
Progress Note  Patient Name: Valerie Mathis Date of Encounter: 09/15/2017  Primary Cardiologist: New (Dr. Sallyanne Kuster)   Subjective   She feels tired, no chest pain or abdominal pain.  Inpatient Medications    Scheduled Meds: . amLODipine  10 mg Oral Daily  . calcitRIOL  0.25 mcg Oral Daily  . carvedilol  25 mg Oral BID  . cloNIDine  0.3 mg Oral TID  . darbepoetin (ARANESP) injection - NON-DIALYSIS  60 mcg Subcutaneous Q Wed-1800  . ferrous sulfate  325 mg Oral BID WC  . furosemide  80 mg Oral BID  . hydrALAZINE  25 mg Oral TID  . isosorbide mononitrate  30 mg Oral Daily   Continuous Infusions: . sodium chloride     PRN Meds: acetaminophen **OR** acetaminophen, labetalol   Vital Signs    Vitals:   09/15/17 0807 09/15/17 0808 09/15/17 0809 09/15/17 0815  BP:    (!) 157/96  Pulse:    (!) 105  Resp: 13 16 17 14   Temp:    97.9 F (36.6 C)  TempSrc:    Oral  SpO2:    100%  Weight:      Height:        Intake/Output Summary (Last 24 hours) at 09/15/2017 1115 Last data filed at 09/15/2017 0850 Gross per 24 hour  Intake 840 ml  Output 5200 ml  Net -4360 ml   Filed Weights   09/13/17 1434 09/14/17 0500 09/15/17 0445  Weight: 131 lb 9.8 oz (59.7 kg) 124 lb 9 oz (56.5 kg) 119 lb 8 oz (54.2 kg)    Telemetry    NSR - Personally Reviewed  ECG    09/12/17, sinus tach 113 bpm - Personally Reviewed  Physical Exam   GEN: No acute distress.   Neck: No JVD Cardiac: RRR, no murmurs, rubs, or gallops.  Respiratory: Clear to auscultation bilaterally. GI: Soft, nontender, non-distended  MS: No edema; No deformity. Neuro:  Nonfocal  Psych: Normal affect   Labs    Chemistry Recent Labs  Lab 09/12/17 1531  09/13/17 0250 09/14/17 0258 09/15/17 0317  NA 137   < > 136 139 139  K 3.8   < > 3.9 4.0 3.7  CL 107   < > 109 110 108  CO2 18*  --  17* 18* 21*  GLUCOSE 102*   < > 83 89 103*  BUN 29*   < > 31* 31* 29*  CREATININE 4.23*   < > 4.43* 4.24* 4.30*  CALCIUM 8.9   --  8.0* 8.1* 8.4*  PROT 6.9  --  5.5*  --   --   ALBUMIN 3.4*  --  2.7* 2.6* 2.9*  AST 16  --  10*  --   --   ALT 10*  --  8*  --   --   ALKPHOS 187*  --  147*  --   --   BILITOT 0.8  --  0.7  --   --   GFRNONAA 13*  --  12* 13* 13*  GFRAA 15*  --  14* 15* 15*  ANIONGAP 12  --  10 11 10    < > = values in this interval not displayed.     Hematology Recent Labs  Lab 09/12/17 1538 09/13/17 0250 09/14/17 0258  WBC  --  4.2 4.9  RBC  --  3.71* 3.83*  HGB 12.6 9.5* 9.7*  HCT 37.0 29.4* 30.4*  MCV  --  79.2 79.4  MCH  --  25.6* 25.3*  MCHC  --  32.3 31.9  RDW  --  18.3* 18.7*  PLT  --  236 241    Cardiac Enzymes Recent Labs  Lab 09/13/17 0250 09/13/17 1445  TROPONINI 0.05* 0.04*    Recent Labs  Lab 09/12/17 1536  TROPIPOC 0.04     BNP Recent Labs  Lab 09/12/17 1531  BNP >4,500.0*     DDimer No results for input(s): DDIMER in the last 168 hours.   Radiology    Dg Chest 1 View  Result Date: 09/13/2017 CLINICAL DATA:  Chest pain EXAM: CHEST  1 VIEW COMPARISON:  None. FINDINGS: There is no edema or consolidation. Heart is mildly enlarged with pulmonary vascularity within normal limits. Thoracic aorta is mildly prominent. Patient is status post median sternotomy with surgical clips in the mid chest and right upper chest regions. No pneumothorax. No bone lesions. IMPRESSION: Postoperative changes. Heart mildly enlarged. Thoracic aorta mildly prominent for age. No edema or consolidation. No pneumothorax. Electronically Signed   By: Lowella Grip III M.D.   On: 09/13/2017 14:13   Mr Jodene Nam Chest Wo Contrast  Result Date: 09/13/2017 CLINICAL DATA:  Thoracic Stanford type a aortic dissection. Status post repair. Repair performed in 2018. New onset severe back pain. Patient was evaluated by cardiology and cardiothoracic surgery after transesophageal echo. EXAM: MRA CHEST, ABDOMEN AND PELVIS WITHOUT CONTRAST TECHNIQUE: Multiplanar, multiecho pulse sequences of the chest,  abdomen, and pelvis were obtained without intravenous contrast. Angiographic images of abdomen and pelvis were obtained using MRA technique without intravenous contrast. CONTRAST:  None COMPARISON:  None. FINDINGS: The study is severely limited due to noncontrast MRA technique. Candy cane cine images are also included. Limited imaging of the vascular system is performed from the chest through the pelvis. Vascular: Thorax: Ascending aortic graft repair is difficult to delineate. The ascending aorta is normal in caliber and patent without evidence of dissection. Maximal diameter is 3.5 cm. A dissection in the thoracic aorta is present. It begins just beyond the takeoff of the left subclavian artery. The true lumen is somewhat narrowed in the distal aortic arch and proximal descending thoracic aorta. There is mixed signal intensity in the false lumen which begins just beyond the left subclavian artery and extends to the mid descending thoracic aorta. This signal may represent artifact or possibly an element of hematoma within the false lumen. This discrepancy cannot be resolved on this noncontrast study. Candy cane images demonstrate flow in the true lumen. Flow cannot be confirmed in the false lumen on the candy cane images. I suspect that there is a combination of flow and chronic mural thrombus within the false lumen. I also suspect at the distal aspect of the dissection, within the mid descending thoracic aorta is fenestrated. See images 13 and 14 of series 4. Maximal diameter of the proximal descending thoracic aorta is 4.0 cm. The origins of the innominate artery, left common carotid artery, and left subclavian artery are grossly patent. There is no obvious acute pulmonary thromboembolism. Abdomen and pelvis: There is no evidence of aortic dissection or aneurysm within the abdomen. The origins of the celiac and SMA are grossly patent. Renal arteries are not clearly visualized. Common iliac arteries are poorly  visualized but are grossly patent. Nonvascular: Thorax: There is no evidence of mediastinal mass effect. There is no obvious mediastinal hemorrhage. Small right layering pleural effusion and basilar atelectasis is noted. Abdomen of pelvis: Trace free fluid about the spleen. A small amount of pericholecystic  fluid adjacent to the gallbladder is suspected in the liver spleen, pancreas, kidneys, and adrenal glands are grossly within normal limits. IMPRESSION: Vascular: The examination is markedly limited due to lack of intravenous contrast. Ascending aortic graft repair is patent without evidence of complication. Thoracic aortic dissection is noted beginning just beyond the takeoff of the left subclavian artery. Origins of the great vessels are grossly patent including the left subclavian artery The true lumen is somewhat narrowed. The false lumen extends from the distal arch to the mid descending thoracic aorta. I suspect that there is a combination of flow and hematoma within the false lumen. The distal and is fenestrated. No evidence of dissection within the abdominal aorta. Nonvascular: Small right pleural effusion. Small amount of free fluid adjacent to the spleen. Small amount of pericholecystic fluid. Electronically Signed   By: Marybelle Killings M.D.   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 Repair performed in 2018. New onset severe back pain. Patient was evaluated by cardiology and cardiothoracic surgery after transesophageal echo. EXAM: MRA CHEST, ABDOMEN AND PELVIS WITHOUT CONTRAST TECHNIQUE: Multiplanar, multiecho pulse sequences of the chest, abdomen, and pelvis were obtained without intravenous contrast. Angiographic images of abdomen and pelvis were obtained using MRA technique without intravenous contrast. CONTRAST:  None COMPARISON:  None. FINDINGS: The study is severely limited due to  noncontrast MRA technique. Candy cane cine images are also included. Limited imaging of the vascular system is performed from the chest through the pelvis. Vascular: Thorax: Ascending aortic graft repair is difficult to delineate. The ascending aorta is normal in caliber and patent without evidence of dissection. Maximal diameter is 3.5 cm. A dissection in the thoracic aorta is present. It begins just beyond the takeoff of the left subclavian artery. The true lumen is somewhat narrowed in the distal aortic arch and proximal descending thoracic aorta. There is mixed signal intensity in the false lumen which begins just beyond the left subclavian artery and extends to the mid descending thoracic aorta. This signal may represent artifact or possibly an element of hematoma within the false lumen. This discrepancy cannot be resolved on this noncontrast study. Candy cane images demonstrate flow in the true lumen. Flow cannot be confirmed in the false lumen on the candy cane images. I suspect that there is a combination of flow and chronic mural thrombus within the false lumen. I also suspect at the distal aspect of the dissection, within the mid descending thoracic aorta is fenestrated. See images 13 and 14 of series 4. Maximal diameter of the proximal descending thoracic aorta is 4.0 cm. The origins of the innominate artery, left common carotid artery, and left subclavian artery are grossly patent. There is no obvious acute pulmonary thromboembolism. Abdomen and pelvis: There is no evidence of aortic dissection or aneurysm within the abdomen. The origins of the celiac and SMA are grossly patent. Renal arteries are not clearly visualized. Common iliac arteries are poorly visualized but are grossly patent. Nonvascular: Thorax: There is no evidence of mediastinal mass effect. There is no obvious mediastinal hemorrhage. Small right layering pleural effusion and basilar atelectasis is noted. Abdomen of pelvis: Trace free  fluid about the spleen. A small amount of pericholecystic fluid adjacent to the gallbladder is suspected in the liver spleen, pancreas, kidneys, and adrenal glands are grossly within normal limits. IMPRESSION: Vascular: The examination is markedly limited due to lack of intravenous  contrast. Ascending aortic graft repair is patent without evidence of complication. Thoracic aortic dissection is noted beginning just beyond the takeoff of the left subclavian artery. Origins of the great vessels are grossly patent including the left subclavian artery The true lumen is somewhat narrowed. The false lumen extends from the distal arch to the mid descending thoracic aorta. I suspect that there is a combination of flow and hematoma within the false lumen. The distal and is fenestrated. No evidence of dissection within the abdominal aorta. Nonvascular: Small right pleural effusion. Small amount of free fluid adjacent to the spleen. Small amount of pericholecystic fluid. Electronically Signed   By: Marybelle Killings M.D.   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 Repair performed in 2018. New onset severe back pain. Patient was evaluated by cardiology and cardiothoracic surgery after transesophageal echo. EXAM: MRA CHEST, ABDOMEN AND PELVIS WITHOUT CONTRAST TECHNIQUE: Multiplanar, multiecho pulse sequences of the chest, abdomen, and pelvis were obtained without intravenous contrast. Angiographic images of abdomen and pelvis were obtained using MRA technique without intravenous contrast. CONTRAST:  None COMPARISON:  None. FINDINGS: The study is severely limited due to noncontrast MRA technique. Candy cane cine images are also included. Limited imaging of the vascular system is performed from the chest through the pelvis. Vascular: Thorax: Ascending aortic graft repair is difficult to delineate. The ascending aorta is  normal in caliber and patent without evidence of dissection. Maximal diameter is 3.5 cm. A dissection in the thoracic aorta is present. It begins just beyond the takeoff of the left subclavian artery. The true lumen is somewhat narrowed in the distal aortic arch and proximal descending thoracic aorta. There is mixed signal intensity in the false lumen which begins just beyond the left subclavian artery and extends to the mid descending thoracic aorta. This signal may represent artifact or possibly an element of hematoma within the false lumen. This discrepancy cannot be resolved on this noncontrast study. Candy cane images demonstrate flow in the true lumen. Flow cannot be confirmed in the false lumen on the candy cane images. I suspect that there is a combination of flow and chronic mural thrombus within the false lumen. I also suspect at the distal aspect of the dissection, within the mid descending thoracic aorta is fenestrated. See images 13 and 14 of series 4. Maximal diameter of the proximal descending thoracic aorta is 4.0 cm. The origins of the innominate artery, left common carotid artery, and left subclavian artery are grossly patent. There is no obvious acute pulmonary thromboembolism. Abdomen and pelvis: There is no evidence of aortic dissection or aneurysm within the abdomen. The origins of the celiac and SMA are grossly patent. Renal arteries are not clearly visualized. Common iliac arteries are poorly visualized but are grossly patent. Nonvascular: Thorax: There is no evidence of mediastinal mass effect. There is no obvious mediastinal hemorrhage. Small right layering pleural effusion and basilar atelectasis is noted. Abdomen of pelvis: Trace free fluid about the spleen. A small amount of pericholecystic fluid adjacent to the gallbladder is suspected in the liver spleen, pancreas, kidneys, and adrenal glands are grossly within normal limits. IMPRESSION: Vascular: The examination is markedly limited  due to lack of intravenous contrast. Ascending aortic graft repair is patent without evidence of complication. Thoracic aortic dissection is noted beginning just beyond the takeoff of the left subclavian artery. Origins of the great vessels are grossly  patent including the left subclavian artery The true lumen is somewhat narrowed. The false lumen extends from the distal arch to the mid descending thoracic aorta. I suspect that there is a combination of flow and hematoma within the false lumen. The distal and is fenestrated. No evidence of dissection within the abdominal aorta. Nonvascular: Small right pleural effusion. Small amount of free fluid adjacent to the spleen. Small amount of pericholecystic fluid. Electronically Signed   By: Marybelle Killings M.D.   On: 09/13/2017 18:24    Cardiac Studies   TEE 09/12/17 FINDINGS:   The aortic root is normal.   There is a graft repair of the ascending aorta and proximal aortic arch. Both the proximal and the distal graft anastomosis appear to be intact.  There is (probably chronic) dissection of the proximal portion of the descending aorta from the distal graft anastomosis to 25 cm from the incisors. An intimal tear is readily identified. The true and false lumen are roughly equal in diameter and the false lumen is distally thrombosed. The aortic diameter is fairly normal (28 mm) in this area. Beyond the area of dissection there is relatively healthy aorta up to 33 cm from the incisors.  At 33 cm from the incisors, there is an area of ulcerated intramural hematoma, relatively short in extent (approximately 2 cm). This may be a new abnormality and responsible for the current symptoms.  Beyond the intramural hematoma, the descending aorta can be followed to the celiac and SMA arteries and appears to be normal. The renal arteries could not be visualized.  Severe left ventricular hypertrophy. Mildly depressed LV systolic function (EF 61-60%), in the setting  of severe HTN (210/160 mm Hg). EF may be better during normal loading conditions. Pseudonormal mitral filling, elevated mean left atrial pressure (again, in setting of severely increased afterload). Normal aortic root and trileaflet aortic valve (trace aortic insufficiency). Normal mitral valve. The left atrium is probably mildly dilated. 2+ TR. Estimated systolic PA pressure is 73-71 mm Hg.  RECOMMENDATIONS:     Aggressive control of BP, with preferential use of beta blockers.   Patient Profile     32 y/o female with a h/o aortic dissection, roughly 1 year ago, treated with surgery in [place] (request for records sent). 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 She [facility] the [facility] ED 09/12/17 with complaint of chest and back pain, in the setting of hypertensive urgency and renal insuffiencey w/ a SCr of 4 (baseline unknown).   Assessment & Plan    1. Hypertensive emergency 2. S/P type A aortic dissection and repair 3. 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 Cocaine abuse 5. CKD stage 45   32 year old female with h/o cocaine abuse, h/o aortic dissection approximately a year ago, treated with aortic root replacement in [place] (request for records sent), she has a long-standing history of extremely severe hypertension possibly sec to chronic cocaine use.  She presented with epigastric pain/chest pain and hypertensive emergency, renal insuffiencey w/ a SCr of 4 (baseline unknown). TEE showed severe hypertensive cardiomyopathy/LVH. Mildly depressed LVEF, possibly due to severe afterload increase (BP 210/140 at onset of study). Moderate pulmonary artery hypertension, likely secondary to left heart failure.  Findings of old Type A aortic dissection with previous surgical graft repair of the ascending aorta and hemiarch, but with persistent dissection of the distal aortic arch and proximal descending thoracic aorta,  up to  25 cm from the incisors. These are probably chronic abnormalities. Small intramural hematoma with ulceration, but without frank dissection, in the distal descending thoracic aorta. This may be a new, acute abnormality. Mildly dilated aortic arch and descending thoracic aorta.  Symptoms resolved with BP control. MRA chest/abdomen/pelvis showed patent type A dissection repair, dissection originating at the distal aortic arch and extending into the descending thoracic aorta but not into abdominal aorta. MRA didn't show any renal artery stenosis, nephrology planning to perform and renal arterial US.   She remains hypertensive, I would increase hydralazine to 50 mg PO TID to her regimen. Her telemetry showed nsVT up to 14 beats, she was asymptomatic, she is on highest dose of carvedilol, she needs to be cautious about cocaine use, consulted. She could be discharged home.   Ena Dawley, MD 09/15/2017

## 2017-09-15 NOTE — Telephone Encounter (Signed)
Sched MD appt 03/21/18 at 12:15. Put order in imaging sching queue.

## 2017-09-15 NOTE — Telephone Encounter (Signed)
-----   Message from Mena Goes, RN sent at 09/15/2017  9:19 AM EDT ----- Regarding: 6 months w/ MRAs   ----- Message ----- From: Rosetta Posner, MD Sent: 09/14/2017   3:38 PM To: [facility], [facility] [employer].  Needs office visit with me in 6 months with MRA of her chest abdomen and pelvis to follow-up thoracic dissection

## 2017-09-15 NOTE — Discharge Summary (Signed)
Physician Discharge Summary  Valerie Mathis QZR:[id number] DOB: 09-26-1985 DOA: 09/12/2017  PCP: Patient, No Pcp Per  Admit date: 09/12/2017 Discharge date: 09/15/2017  Admitted From: Home Disposition:  Home  Recommendations for Outpatient Follow-up:  1. Follow up with PCP in 1-2 weeks 2. Please obtain BMP/CBC in one week your next doctors visit.  3. Following medications have been prescribed to the patient-calcitriol 0.25 micrograms daily, ferrous sulfate 225 mg twice daily, isosorbide mononitrate 30 mg daily, MiraLAX for constipation, hydralazine 50 milligrams 3 times daily, Norvasc 10 mg daily, Coreg 25 mg twice daily, clonidine 0.3 mg 3 times daily, Lasix 20 mg twice daily 4. Counseled to quit using cocaine and other illicit drugs 5. We will need to repeat MRA of chest, abdomen and pelvis in about 6 months as a follow-up evaluation for chronic aortic dissection 6. Iron supplements due to iron deficiency anemia 7. Will need to follow-up outpatient closely with nephrology 8. Follow up with Nephro, Dr Deterding in 2 weeks.   Home Health: None Equipment/Devices: None Discharge Condition: Stable CODE STATUS: Full code Diet recommendation: 2 g salt diet  Brief/Interim Summary:  32 year old African-American with a history of long-standing essential hypertension, illicit drug use, CKD stage III who was previously undergone acute type a dissection repair back in 2018 in [facility] comes to the hospital with complaints of back pain.  TEE performed here demonstrated a sending thoracic aortic graft and proximal aortic root which functionally appeared normal.  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  At this point is determined the mainstay of therapy would be blood pressure control.  During her stay patient was evaluated by cardiology, nephrology, vascular surgery and cardiothoracic surgery.  She was medically  optimized with several blood pressure medications including Norvasc, Lasix, clonidine, Coreg, isosorbide mononitrate.  She has been advised to stop using cocaine as this can adversely affect her especially will be on beta-blockers as well. Renal ultrasound was normal, renal vessels were patent. Iron deficiency anemia therefore started on ferrous sulfate  MRA of the chest abdomen showed thoracic aortic dissection which has been known otherwise nothing acute.  Was reviewed by vascular surgery.  Her blood pressure remains controlled on medications.  She is stable to be discharged with outpatient follow-up recommendations as stated above. No complaints today.   HEENT/EYES = negative for pain, redness, loss of vision, double vision, blurred vision, loss of hearing, sore throat, hoarseness, dysphagia Cardiovascular= negative for chest pain, palpitation, murmurs, lower extremity swelling Respiratory/lungs= negative for shortness of breath, cough, hemoptysis, wheezing, mucus production Gastrointestinal= negative for nausea, vomiting,, abdominal pain, melena, hematemesis Genitourinary= negative for Dysuria, Hematuria, Change in Urinary Frequency MSK = Negative for arthralgia, myalgias, Back Pain, Joint swelling  Neurology= Negative for headache, seizures, numbness, tingling  Psychiatry= Negative for anxiety, depression, suicidal and homocidal ideation Allergy/Immunology= Medication/Food allergy as listed  Skin= Negative for Rash, lesions, ulcers, itching   Discharge Diagnoses:  Principal Problem:   Hypertensive emergency Active Problems:   Back pain   Tachycardia   Chronic kidney disease   Aortic dissection (HCC)   Hypertensive emergency, improved History of acute type a aortic dissection 2018, status post repair.  Appears chronic - At this point patient back pain is slightly better.  Her blood pressure is being controlled by Norvasc 10 mg daily, Coreg 25 mg twice daily, clonidine 0.3 mg  orally 3 times daily, Lasix 80 mg twice daily, hydralazine 25 mg orally 3 times  daily increased to 50mg  - Likely still remains evaluated while awaiting cocaine washout, this morning blood pressure was elevated but she was due for the medication.  It is trended down slowly.  Closely monitor this -Vascular recommend six-month follow-up MRA chest abdomen - Cardiology, nephrology and cardiothoracic surgery agrees with conservative management by  controlled blood pressure. -Renal ultrasound done- patent blood vessels  Chronic kidney disease stage IV -Protein electrophoresis-pending. Hep B and C is negative. Cr Back in [place] 2018 was ~2.9. 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  Predialysis evaluation in place has eventually she will end up requiring it. Nephro following.   Appreciate nephrology input.  Will need to closely follow-up outpatient nephrology -Renal ultrasound is done- patent blood vessels  Illicit drug use, polysubstance abuse -We will need outpatient rehabilitation.  Counseling.  Anemia- iron deficiency and also anemia of chronic disease secondary to CKD -ferrous sulfate 325 mg twice daily. No obvious sign of on going blood loss.   Bowel regimen PRN prescribed - Saturation is less than 5%, iron levels are 14.  Ferritin not checked    Discharge Instructions   Allergies as of 09/15/2017   No Known Allergies     Medication List    TAKE these medications   amLODipine 10 MG tablet Commonly known as:  NORVASC Take 1 tablet (10 mg total) by mouth daily. for high blood pressure   calcitRIOL 0.25 MCG capsule Commonly known as:  ROCALTROL Take 1 capsule (0.25 mcg total) by mouth daily. Start taking on:  09/16/2017   carvedilol 25 MG tablet Commonly known as:  COREG Take 1 tablet (25 mg total) by mouth 2 (two) times daily. For blood pressure/heart   cloNIDine 0.3 MG tablet Commonly known as:  CATAPRES Take 1 tablet (0.3 mg total)  by mouth 3 (three) times daily. For high blood pressure   ferrous sulfate 325 (65 FE) MG tablet Take 1 tablet (325 mg total) by mouth 2 (two) times daily with a meal.   furosemide 20 MG tablet Commonly known as:  LASIX Take 1 tablet (20 mg total) by mouth 2 (two) times daily. For high blood pressure/fluid   hydrALAZINE 25 MG tablet Commonly known as:  APRESOLINE Take 1 tablet (25 mg total) by mouth 3 (three) times daily. For high blood pressure What changed:  Another medication with the same name was added. Make sure you understand how and when to take each.   hydrALAZINE 50 MG tablet Commonly known as:  APRESOLINE Take 1 tablet (50 mg total) by mouth 3 (three) times daily. What changed:  You were already taking a medication with the same name, and this prescription was added. Make sure you understand how and when to take each.   isosorbide mononitrate 30 MG 24 hr tablet Commonly known as:  IMDUR Take 1 tablet (30 mg total) by mouth daily. Start taking on:  09/16/2017   polyethylene glycol packet Commonly known as:  MIRALAX Take 17 g by mouth daily as needed for moderate constipation or severe constipation.      Follow-up Information    Croitoru, Mihai, MD. Schedule an appointment as soon as possible for a visit in 2 weeks.   Specialty:  Cardiology Why:  May 21st @ 8:30am Contact information: [address] [place] [address] [phone]        Mauricia Area, MD. Schedule an appointment as soon as possible for a visit in 2 week(s).   Specialty:  Nephrology Contact information: [place] [address] [phone]        Rosetta Posner, MD. Schedule an appointment as soon as possible for a visit in 2 month(s).   Specialties:  Vascular Surgery, Cardiology Contact information: [address] [place] [address] [phone]          No Known Allergies  You were cared for by a hospitalist during your hospital stay. If you have  any questions about your discharge medications or the care you received while you were in the hospital after you are discharged, you can call the unit and asked to speak with the hospitalist on call if the hospitalist that took care of you is not available. Once you are discharged, your primary care physician will handle any further medical issues. Please note that no refills for any discharge medications will be authorized once you are discharged, as it is imperative that you return to your primary care physician (or establish a relationship with a primary care physician if you do not have one) for your aftercare needs so that they can reassess your need for medications and monitor your lab values.  Consultations:  Cardiothoracic surgery  Cardiology  Vascular surgery  Nephrology   Procedures/Studies: Dg Chest 1 View  Result Date: 09/13/2017 CLINICAL DATA:  Chest pain EXAM: CHEST  1 VIEW COMPARISON:  None. FINDINGS: There is no edema or consolidation. Heart is mildly enlarged with pulmonary vascularity within normal limits. Thoracic aorta is mildly prominent. Patient is status post median sternotomy with surgical clips in the mid chest and right upper chest regions. No pneumothorax. No bone lesions. IMPRESSION: Postoperative changes. Heart mildly enlarged. Thoracic aorta mildly prominent for age. No edema or consolidation. No pneumothorax. Electronically Signed   By: Lowella Grip III M.D.   On: 09/13/2017 14:13   Mr Jodene Nam Chest Wo Contrast  Result Date: 09/13/2017 CLINICAL DATA:  Thoracic Stanford type a aortic dissection. Status post repair. Repair performed in 2018. New onset severe back pain. Patient was evaluated by cardiology and cardiothoracic surgery after transesophageal echo. EXAM: MRA CHEST, ABDOMEN AND PELVIS WITHOUT CONTRAST TECHNIQUE: Multiplanar, multiecho pulse sequences of the chest, abdomen, and pelvis were obtained without intravenous contrast. Angiographic images of abdomen  and pelvis were obtained using MRA technique without intravenous contrast. CONTRAST:  None COMPARISON:  None. FINDINGS: The study is severely limited due to noncontrast MRA technique. Candy cane cine images are also included. Limited imaging of the vascular system is performed from the chest through the pelvis. Vascular: Thorax: Ascending aortic graft repair is difficult to delineate. The ascending aorta is normal in caliber and patent without evidence of dissection. Maximal diameter is 3.5 cm. A dissection in the thoracic aorta is present. It begins just beyond the takeoff of the left subclavian artery. The true lumen is somewhat narrowed in the distal aortic arch and proximal descending thoracic aorta. There is mixed signal intensity in the false lumen which begins just beyond the left subclavian artery and extends to the mid descending thoracic aorta. This signal may represent artifact or possibly an element of hematoma within the false lumen. This discrepancy cannot be resolved on this noncontrast study. Candy cane images demonstrate flow in the true lumen. Flow cannot be confirmed in the false lumen on the candy cane images. I suspect that there is a combination of flow and chronic mural thrombus within the false lumen. I also suspect at the distal aspect of the dissection, within the mid descending thoracic aorta  is fenestrated. See images 13 and 14 of series 4. Maximal diameter of the proximal descending thoracic aorta is 4.0 cm. The origins of the innominate artery, left common carotid artery, and left subclavian artery are grossly patent. There is no obvious acute pulmonary thromboembolism. Abdomen and pelvis: There is no evidence of aortic dissection or aneurysm within the abdomen. The origins of the celiac and SMA are grossly patent. Renal arteries are not clearly visualized. Common iliac arteries are poorly visualized but are grossly patent. Nonvascular: Thorax: There is no evidence of mediastinal mass  effect. There is no obvious mediastinal hemorrhage. Small right layering pleural effusion and basilar atelectasis is noted. Abdomen of pelvis: Trace free fluid about the spleen. A small amount of pericholecystic fluid adjacent to the gallbladder is suspected in the liver spleen, pancreas, kidneys, and adrenal glands are grossly within normal limits. IMPRESSION: Vascular: The examination is markedly limited due to lack of intravenous contrast. Ascending aortic graft repair is patent without evidence of complication. Thoracic aortic dissection is noted beginning just beyond the takeoff of the left subclavian artery. Origins of the great vessels are grossly patent including the left subclavian artery The true lumen is somewhat narrowed. The false lumen extends from the distal arch to the mid descending thoracic aorta. I suspect that there is a combination of flow and hematoma within the false lumen. The distal and is fenestrated. No evidence of dissection within the abdominal aorta. Nonvascular: Small right pleural effusion. Small amount of free fluid adjacent to the spleen. Small amount of pericholecystic fluid. Electronically Signed   By: Marybelle Killings M.D.   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 Repair performed in 2018. New onset severe back pain. Patient was evaluated by cardiology and cardiothoracic surgery after transesophageal echo. EXAM: MRA CHEST, ABDOMEN AND PELVIS WITHOUT CONTRAST TECHNIQUE: Multiplanar, multiecho pulse sequences of the chest, abdomen, and pelvis were obtained without intravenous contrast. Angiographic images of abdomen and pelvis were obtained using MRA technique without intravenous contrast. CONTRAST:  None COMPARISON:  None. FINDINGS: The study is severely limited due to noncontrast MRA technique. Candy cane cine images are also included. Limited imaging of the vascular  system is performed from the chest through the pelvis. Vascular: Thorax: Ascending aortic graft repair is difficult to delineate. The ascending aorta is normal in caliber and patent without evidence of dissection. Maximal diameter is 3.5 cm. A dissection in the thoracic aorta is present. It begins just beyond the takeoff of the left subclavian artery. The true lumen is somewhat narrowed in the distal aortic arch and proximal descending thoracic aorta. There is mixed signal intensity in the false lumen which begins just beyond the left subclavian artery and extends to the mid descending thoracic aorta. This signal may represent artifact or possibly an element of hematoma within the false lumen. This discrepancy cannot be resolved on this noncontrast study. Candy cane images demonstrate flow in the true lumen. Flow cannot be confirmed in the false lumen on the candy cane images. I suspect that there is a combination of flow and chronic mural thrombus within the false lumen. I also suspect at the distal aspect of the dissection, within the mid descending thoracic aorta is fenestrated. See images 13 and 14 of series 4. Maximal diameter of the proximal descending thoracic aorta is 4.0 cm. The origins of the innominate artery, left common carotid artery, and left  subclavian artery are grossly patent. There is no obvious acute pulmonary thromboembolism. Abdomen and pelvis: There is no evidence of aortic dissection or aneurysm within the abdomen. The origins of the celiac and SMA are grossly patent. Renal arteries are not clearly visualized. Common iliac arteries are poorly visualized but are grossly patent. Nonvascular: Thorax: There is no evidence of mediastinal mass effect. There is no obvious mediastinal hemorrhage. Small right layering pleural effusion and basilar atelectasis is noted. Abdomen of pelvis: Trace free fluid about the spleen. A small amount of pericholecystic fluid adjacent to the gallbladder is suspected  in the liver spleen, pancreas, kidneys, and adrenal glands are grossly within normal limits. IMPRESSION: Vascular: The examination is markedly limited due to lack of intravenous contrast. Ascending aortic graft repair is patent without evidence of complication. Thoracic aortic dissection is noted beginning just beyond the takeoff of the left subclavian artery. Origins of the great vessels are grossly patent including the left subclavian artery The true lumen is somewhat narrowed. The false lumen extends from the distal arch to the mid descending thoracic aorta. I suspect that there is a combination of flow and hematoma within the false lumen. The distal and is fenestrated. No evidence of dissection within the abdominal aorta. Nonvascular: Small right pleural effusion. Small amount of free fluid adjacent to the spleen. Small amount of pericholecystic fluid. Electronically Signed   By: Marybelle Killings M.D.   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 Repair performed in 2018. New onset severe back pain. Patient was evaluated by cardiology and cardiothoracic surgery after transesophageal echo. EXAM: MRA CHEST, ABDOMEN AND PELVIS WITHOUT CONTRAST TECHNIQUE: Multiplanar, multiecho pulse sequences of the chest, abdomen, and pelvis were obtained without intravenous contrast. Angiographic images of abdomen and pelvis were obtained using MRA technique without intravenous contrast. CONTRAST:  None COMPARISON:  None. FINDINGS: The study is severely limited due to noncontrast MRA technique. Candy cane cine images are also included. Limited imaging of the vascular system is performed from the chest through the pelvis. Vascular: Thorax: Ascending aortic graft repair is difficult to delineate. The ascending aorta is normal in caliber and patent without evidence of dissection. Maximal diameter is 3.5 cm. A dissection in  the thoracic aorta is present. It begins just beyond the takeoff of the left subclavian artery. The true lumen is somewhat narrowed in the distal aortic arch and proximal descending thoracic aorta. There is mixed signal intensity in the false lumen which begins just beyond the left subclavian artery and extends to the mid descending thoracic aorta. This signal may represent artifact or possibly an element of hematoma within the false lumen. This discrepancy cannot be resolved on this noncontrast study. Candy cane images demonstrate flow in the true lumen. Flow cannot be confirmed in the false lumen on the candy cane images. I suspect that there is a combination of flow and chronic mural thrombus within the false lumen. I also suspect at the distal aspect of the dissection, within the mid descending thoracic aorta is fenestrated. See images 13 and 14 of series 4. Maximal diameter of the proximal descending thoracic aorta is 4.0 cm. The origins of the innominate artery, left common carotid artery, and left subclavian artery are grossly patent. There is no obvious acute pulmonary thromboembolism. Abdomen and pelvis: There is no evidence of aortic dissection or aneurysm within the abdomen. The origins of the celiac and  SMA are grossly patent. Renal arteries are not clearly visualized. Common iliac arteries are poorly visualized but are grossly patent. Nonvascular: Thorax: There is no evidence of mediastinal mass effect. There is no obvious mediastinal hemorrhage. Small right layering pleural effusion and basilar atelectasis is noted. Abdomen of pelvis: Trace free fluid about the spleen. A small amount of pericholecystic fluid adjacent to the gallbladder is suspected in the liver spleen, pancreas, kidneys, and adrenal glands are grossly within normal limits. IMPRESSION: Vascular: The examination is markedly limited due to lack of intravenous contrast. Ascending aortic graft repair is patent without evidence of  complication. Thoracic aortic dissection is noted beginning just beyond the takeoff of the left subclavian artery. Origins of the great vessels are grossly patent including the left subclavian artery The true lumen is somewhat narrowed. The false lumen extends from the distal arch to the mid descending thoracic aorta. I suspect that there is a combination of flow and hematoma within the false lumen. The distal and is fenestrated. No evidence of dissection within the abdominal aorta. Nonvascular: Small right pleural effusion. Small amount of free fluid adjacent to the spleen. Small amount of pericholecystic fluid. Electronically Signed   By: Marybelle Killings M.D.   On: 09/13/2017 18:24   US Abdomen Complete  Result Date: 09/12/2017 CLINICAL DATA:  Abdominal and back pain. Renal disorder, hypertension. EXAM: COMPLETE ABDOMINAL ULTRASOUND COMPARISON:  None available FINDINGS: Gallbladder: Wall thickening up to 10 mm. Stones in the dependent aspect of the gallbladder measuring up to 7 mm. Trace pericholecystic fluid. The gallbladder is incompletely distended. Common bile duct:  Normal in caliber, 1.24mm diameter. Liver: Homogeneous in echotexture without focal lesion or intrahepatic bile duct dilatation. IVC:  Negative Pancreas:  Negative Spleen:  No focal lesion, craniocaudal 9cm in length. Right Kidney: No mass or hydronephrosis, echogenic compared to the adjacent liver. 9.2cm in length. Left Kidney:  No lesion or hydronephrosis, 9.8cm in length. Abdominal aorta: Fusiform aneurysm measured up to 3.2 cm in the proximal segment. Trace perisplenic ascites. IMPRESSION: 1. Cholelithiasis with thick walled nondistended gallbladder, suggestive of chronic cholecystitis. 2. Echogenic renal parenchyma, a nonspecific finding often associated with medical renal disease. No hydronephrosis. 3. Trace abdominal ascites. 4. 3.2 cm abdominal aortic aneurysm. Recommend followup by ultrasound in 3 years. This recommendation follows ACR  consensus guidelines: White Paper of the ACR Incidental Findings Committee II on Vascular Findings. [name] Coll Radiol 2013; 10:789-794 Electronically Signed   By: Lucrezia Europe M.D.   On: 09/12/2017 17:35      Subjective:   Discharge Exam: Vitals:   09/15/17 0809 09/15/17 0815  BP:  (!) 157/96  Pulse:  (!) 105  Resp: 17 14  Temp:  97.9 F (36.6 C)  SpO2:  100%   Vitals:   09/15/17 0807 09/15/17 0808 09/15/17 0809 09/15/17 0815  BP:    (!) 157/96  Pulse:    (!) 105  Resp: 13 16 17 14   Temp:    97.9 F (36.6 C)  TempSrc:    Oral  SpO2:    100%  Weight:      Height:        General: Pt is alert, awake, not in acute distress Cardiovascular: RRR, S1/S2 +, no rubs, no gallops Respiratory: CTA bilaterally, no wheezing, no rhonchi Abdominal: Soft, NT, ND, bowel sounds + Extremities: no edema, no cyanosis    The results of significant diagnostics from this hospitalization (including imaging, microbiology, ancillary and laboratory) are listed below for reference.  Microbiology: No results found for this or any previous visit (from the past 240 hour(s)).   Labs: BNP (last 3 results) Recent Labs    09/12/17 1531  BNP >5,638.9*   Basic Metabolic Panel: Recent Labs  Lab 09/12/17 1531 09/12/17 1537 09/12/17 1538 09/13/17 0250 09/14/17 0258 09/15/17 0317  NA 137  --  137 136 139 139  K 3.8  --  3.9 3.9 4.0 3.7  CL 107  --  109 109 110 108  CO2 18*  --   --  17* 18* 21*  GLUCOSE 102*  --  101* 83 89 103*  BUN 29*  --  33* 31* 31* 29*  CREATININE 4.23* 4.40* 4.10* 4.43* 4.24* 4.30*  CALCIUM 8.9  --   --  8.0* 8.1* 8.4*  PHOS  --   --   --   --  3.8 3.8   Liver Function Tests: Recent Labs  Lab 09/12/17 1531 09/13/17 0250 09/14/17 0258 09/15/17 0317  AST 16 10*  --   --   ALT 10* 8*  --   --   ALKPHOS 187* 147*  --   --   BILITOT 0.8 0.7  --   --   PROT 6.9 5.5*  --   --   ALBUMIN 3.4* 2.7* 2.6* 2.9*   No results for input(s): LIPASE, AMYLASE in the last  168 hours. No results for input(s): AMMONIA in the last 168 hours. CBC: Recent Labs  Lab 09/12/17 1538 09/13/17 0250 09/14/17 0258  WBC  --  4.2 4.9  HGB 12.6 9.5* 9.7*  HCT 37.0 29.4* 30.4*  MCV  --  79.2 79.4  PLT  --  236 241   Cardiac Enzymes: Recent Labs  Lab 09/13/17 0250 09/13/17 1445  TROPONINI 0.05* 0.04*   BNP: Invalid input(s): POCBNP CBG: No results for input(s): GLUCAP in the last 168 hours. D-Dimer No results for input(s): DDIMER in the last 72 hours. Hgb A1c No results for input(s): HGBA1C in the last 72 hours. Lipid Profile No results for input(s): CHOL, HDL, LDLCALC, TRIG, CHOLHDL, LDLDIRECT in the last 72 hours. Thyroid function studies Recent Labs    09/12/17 1950  TSH 1.856   Anemia work up Recent Labs    09/13/17 1445  TIBC 297  IRON 14*   Urinalysis    Component Value Date/Time   COLORURINE YELLOW 09/13/2017 0028   APPEARANCEUR HAZY (A) 09/13/2017 0028   LABSPEC 1.014 09/13/2017 0028   PHURINE 5.0 09/13/2017 0028   GLUCOSEU NEGATIVE 09/13/2017 0028   HGBUR SMALL (A) 09/13/2017 0028   BILIRUBINUR NEGATIVE 09/13/2017 0028   KETONESUR NEGATIVE 09/13/2017 0028   PROTEINUR >=300 (A) 09/13/2017 0028   NITRITE NEGATIVE 09/13/2017 0028   LEUKOCYTESUR SMALL (A) 09/13/2017 0028   Sepsis Labs Invalid input(s): PROCALCITONIN,  WBC,  LACTICIDVEN Microbiology No results found for this or any previous visit (from the past 240 hour(s)).   Time coordinating discharge:  I have spent 35 minutes face to face with the patient and on the ward discussing the patients care, assessment, plan and disposition with other care givers. >50% of the time was devoted counseling the patient about the risks and benefits of treatment/Discharge disposition and coordinating care.   SIGNED:   Damita Lack, MD  [facility] 09/15/2017, 12:59 PM Pager   If 7PM-7AM, please contact night-coverage [email or web address] Password [email or web address]

## 2017-09-16 ENCOUNTER — Other Ambulatory Visit: Payer: Self-pay

## 2017-09-22 ENCOUNTER — Other Ambulatory Visit: Payer: Self-pay

## 2017-09-22 DIAGNOSIS — I71019 Dissection of thoracic aorta, unspecified: Secondary | ICD-10-CM

## 2017-09-22 DIAGNOSIS — I7101 Dissection of thoracic aorta: Secondary | ICD-10-CM

## 2017-10-04 ENCOUNTER — Ambulatory Visit: Payer: Self-pay | Admitting: Cardiology

## 2017-10-05 ENCOUNTER — Encounter: Payer: Self-pay | Admitting: Cardiology

## 2017-10-20 ENCOUNTER — Other Ambulatory Visit: Payer: Self-pay

## 2017-10-20 ENCOUNTER — Emergency Department (HOSPITAL_COMMUNITY): Payer: Medicaid Other

## 2017-10-20 ENCOUNTER — Inpatient Hospital Stay (HOSPITAL_COMMUNITY)
Admission: EM | Admit: 2017-10-20 | Discharge: 2017-10-25 | DRG: 682 | Disposition: A | Discharge status: Home / Self Care | Payer: Medicaid Other | Attending: Internal Medicine | Admitting: Internal Medicine

## 2017-10-20 ENCOUNTER — Encounter (HOSPITAL_COMMUNITY): Payer: Self-pay

## 2017-10-20 DIAGNOSIS — E872 Acidosis: Secondary | ICD-10-CM | POA: Diagnosis present

## 2017-10-20 DIAGNOSIS — Z91120 Patient's intentional underdosing of medication regimen due to financial hardship: Secondary | ICD-10-CM

## 2017-10-20 DIAGNOSIS — I1311 Hypertensive heart and chronic kidney disease without heart failure, with stage 5 chronic kidney disease, or end stage renal disease: Secondary | ICD-10-CM | POA: Diagnosis not present

## 2017-10-20 DIAGNOSIS — I43 Cardiomyopathy in diseases classified elsewhere: Secondary | ICD-10-CM | POA: Diagnosis present

## 2017-10-20 DIAGNOSIS — F17210 Nicotine dependence, cigarettes, uncomplicated: Secondary | ICD-10-CM | POA: Diagnosis present

## 2017-10-20 DIAGNOSIS — Z9114 Patient's other noncompliance with medication regimen: Secondary | ICD-10-CM

## 2017-10-20 DIAGNOSIS — I7100 Dissection of unspecified site of aorta: Secondary | ICD-10-CM | POA: Diagnosis present

## 2017-10-20 DIAGNOSIS — Z560 Unemployment, unspecified: Secondary | ICD-10-CM

## 2017-10-20 DIAGNOSIS — F1410 Cocaine abuse, uncomplicated: Secondary | ICD-10-CM | POA: Diagnosis present

## 2017-10-20 DIAGNOSIS — N189 Chronic kidney disease, unspecified: Secondary | ICD-10-CM | POA: Diagnosis present

## 2017-10-20 DIAGNOSIS — Z95820 Peripheral vascular angioplasty status with implants and grafts: Secondary | ICD-10-CM

## 2017-10-20 DIAGNOSIS — I10 Essential (primary) hypertension: Secondary | ICD-10-CM

## 2017-10-20 DIAGNOSIS — Z79899 Other long term (current) drug therapy: Secondary | ICD-10-CM

## 2017-10-20 DIAGNOSIS — T501X6A Underdosing of loop [high-ceiling] diuretics, initial encounter: Secondary | ICD-10-CM | POA: Diagnosis present

## 2017-10-20 DIAGNOSIS — I71019 Dissection of thoracic aorta, unspecified: Secondary | ICD-10-CM | POA: Diagnosis present

## 2017-10-20 DIAGNOSIS — A084 Viral intestinal infection, unspecified: Secondary | ICD-10-CM | POA: Diagnosis present

## 2017-10-20 DIAGNOSIS — N185 Chronic kidney disease, stage 5: Secondary | ICD-10-CM | POA: Diagnosis present

## 2017-10-20 DIAGNOSIS — T465X6A Underdosing of other antihypertensive drugs, initial encounter: Secondary | ICD-10-CM | POA: Diagnosis present

## 2017-10-20 DIAGNOSIS — Z86718 Personal history of other venous thrombosis and embolism: Secondary | ICD-10-CM

## 2017-10-20 DIAGNOSIS — K811 Chronic cholecystitis: Secondary | ICD-10-CM

## 2017-10-20 DIAGNOSIS — M311 Thrombotic microangiopathy: Secondary | ICD-10-CM

## 2017-10-20 DIAGNOSIS — Z8249 Family history of ischemic heart disease and other diseases of the circulatory system: Secondary | ICD-10-CM

## 2017-10-20 DIAGNOSIS — F1490 Cocaine use, unspecified, uncomplicated: Secondary | ICD-10-CM | POA: Diagnosis present

## 2017-10-20 DIAGNOSIS — R011 Cardiac murmur, unspecified: Secondary | ICD-10-CM

## 2017-10-20 DIAGNOSIS — N184 Chronic kidney disease, stage 4 (severe): Secondary | ICD-10-CM

## 2017-10-20 DIAGNOSIS — I119 Hypertensive heart disease without heart failure: Secondary | ICD-10-CM

## 2017-10-20 DIAGNOSIS — R1013 Epigastric pain: Secondary | ICD-10-CM

## 2017-10-20 DIAGNOSIS — I1310 Hypertensive heart and chronic kidney disease without heart failure, with stage 1 through stage 4 chronic kidney disease, or unspecified chronic kidney disease: Secondary | ICD-10-CM

## 2017-10-20 DIAGNOSIS — R112 Nausea with vomiting, unspecified: Secondary | ICD-10-CM

## 2017-10-20 DIAGNOSIS — R109 Unspecified abdominal pain: Secondary | ICD-10-CM

## 2017-10-20 DIAGNOSIS — Z720 Tobacco use: Secondary | ICD-10-CM

## 2017-10-20 DIAGNOSIS — I7101 Dissection of thoracic aorta: Secondary | ICD-10-CM | POA: Diagnosis present

## 2017-10-20 LAB — RAPID URINE DRUG SCREEN, HOSP PERFORMED
Amphetamines: NOT DETECTED
Barbiturates: NOT DETECTED
Benzodiazepines: NOT DETECTED
COCAINE: POSITIVE — AB
OPIATES: NOT DETECTED
Tetrahydrocannabinol: NOT DETECTED

## 2017-10-20 LAB — COMPREHENSIVE METABOLIC PANEL
ALK PHOS: 180 U/L — AB (ref 38–126)
ALT: 14 U/L (ref 14–54)
ANION GAP: 11 (ref 5–15)
AST: 26 U/L (ref 15–41)
Albumin: 3.6 g/dL (ref 3.5–5.0)
BUN: 30 mg/dL — ABNORMAL HIGH (ref 6–20)
CALCIUM: 8.8 mg/dL — AB (ref 8.9–10.3)
CHLORIDE: 109 mmol/L (ref 101–111)
CO2: 19 mmol/L — AB (ref 22–32)
Creatinine, Ser: 4.21 mg/dL — ABNORMAL HIGH (ref 0.44–1.00)
GFR calc Af Amer: 15 mL/min — ABNORMAL LOW (ref >60)
GFR calc non Af Amer: 13 mL/min — ABNORMAL LOW (ref >60)
GLUCOSE: 98 mg/dL (ref 65–99)
Potassium: 4.5 mmol/L (ref 3.5–5.1)
SODIUM: 139 mmol/L (ref 135–145)
Total Bilirubin: 1.4 mg/dL — ABNORMAL HIGH (ref 0.3–1.2)
Total Protein: 7.1 g/dL (ref 6.5–8.1)

## 2017-10-20 LAB — CBC
HCT: 44.9 % (ref 36.0–46.0)
HEMOGLOBIN: 14.6 g/dL (ref 12.0–15.0)
MCH: 27.4 pg (ref 26.0–34.0)
MCHC: 32.5 g/dL (ref 30.0–36.0)
MCV: 84.2 fL (ref 78.0–100.0)
PLATELETS: 142 10*3/uL — AB (ref 150–400)
RBC: 5.33 MIL/uL — ABNORMAL HIGH (ref 3.87–5.11)
RDW: 23.7 % — ABNORMAL HIGH (ref 11.5–15.5)
WBC: 9.2 10*3/uL (ref 4.0–10.5)

## 2017-10-20 LAB — URINALYSIS, ROUTINE W REFLEX MICROSCOPIC
Bilirubin Urine: NEGATIVE
Glucose, UA: 50 mg/dL — AB
KETONES UR: NEGATIVE mg/dL
Nitrite: NEGATIVE
PH: 6 (ref 5.0–8.0)
Specific Gravity, Urine: 1.016 (ref 1.005–1.030)

## 2017-10-20 LAB — I-STAT CHEM 8, ED
BUN: 42 mg/dL — ABNORMAL HIGH (ref 6–20)
CALCIUM ION: 1.02 mmol/L — AB (ref 1.15–1.40)
CHLORIDE: 110 mmol/L (ref 101–111)
Creatinine, Ser: 4.1 mg/dL — ABNORMAL HIGH (ref 0.44–1.00)
Glucose, Bld: 94 mg/dL (ref 65–99)
HCT: 48 % — ABNORMAL HIGH (ref 36.0–46.0)
Hemoglobin: 16.3 g/dL — ABNORMAL HIGH (ref 12.0–15.0)
Potassium: 5.1 mmol/L (ref 3.5–5.1)
Sodium: 138 mmol/L (ref 135–145)
TCO2: 20 mmol/L — AB (ref 22–32)

## 2017-10-20 LAB — I-STAT TROPONIN, ED: Troponin i, poc: 0.02 ng/mL (ref 0.00–0.08)

## 2017-10-20 LAB — I-STAT BETA HCG BLOOD, ED (MC, WL, AP ONLY): I-stat hCG, quantitative: <5 m[IU]/mL (ref <5)

## 2017-10-20 LAB — HEMOGLOBIN A1C
Hgb A1c MFr Bld: 5.2 % (ref 4.8–5.6)
Mean Plasma Glucose: 102.54 mg/dL

## 2017-10-20 LAB — LIPASE, BLOOD: LIPASE: 25 U/L (ref 11–51)

## 2017-10-20 MED ORDER — NICARDIPINE HCL IN NACL 20-0.86 MG/200ML-% IV SOLN
INTRAVENOUS | Status: AC
  Administered 2017-10-20: 10 mg/h via INTRAVENOUS
  Filled 2017-10-20: qty 200

## 2017-10-20 MED ORDER — TRAMADOL HCL 50 MG PO TABS
50.0000 mg | ORAL_TABLET | Freq: Once | ORAL | Status: AC
  Administered 2017-10-20: 50 mg via ORAL
  Filled 2017-10-20: qty 1

## 2017-10-20 MED ORDER — NICARDIPINE HCL IN NACL 20-0.86 MG/200ML-% IV SOLN
INTRAVENOUS | Status: AC
  Filled 2017-10-20: qty 200

## 2017-10-20 MED ORDER — FENTANYL CITRATE (PF) 100 MCG/2ML IJ SOLN
50.0000 ug | Freq: Once | INTRAMUSCULAR | Status: AC
  Administered 2017-10-20: 50 ug via INTRAVENOUS
  Filled 2017-10-20: qty 2

## 2017-10-20 MED ORDER — ACETAMINOPHEN 325 MG PO TABS
650.0000 mg | ORAL_TABLET | Freq: Four times a day (QID) | ORAL | Status: DC | PRN
  Administered 2017-10-20 – 2017-10-25 (×8): 650 mg via ORAL
  Filled 2017-10-20 (×9): qty 2

## 2017-10-20 MED ORDER — NICARDIPINE HCL IN NACL 20-0.86 MG/200ML-% IV SOLN
3.0000 mg/h | Freq: Once | INTRAVENOUS | Status: AC
  Administered 2017-10-20: 5 mg/h via INTRAVENOUS
  Administered 2017-10-20 (×2): 10 mg/h via INTRAVENOUS
  Filled 2017-10-20: qty 200

## 2017-10-20 MED ORDER — ONDANSETRON HCL 4 MG PO TABS
4.0000 mg | ORAL_TABLET | Freq: Four times a day (QID) | ORAL | Status: DC | PRN
  Administered 2017-10-21: 4 mg via ORAL
  Filled 2017-10-20: qty 1

## 2017-10-20 MED ORDER — NICOTINE 7 MG/24HR TD PT24
7.0000 mg | MEDICATED_PATCH | Freq: Every day | TRANSDERMAL | Status: DC
  Filled 2017-10-20 (×6): qty 1

## 2017-10-20 MED ORDER — HYDRALAZINE HCL 20 MG/ML IJ SOLN
5.0000 mg | Freq: Four times a day (QID) | INTRAMUSCULAR | Status: DC | PRN
  Administered 2017-10-20: 5 mg via INTRAVENOUS
  Filled 2017-10-20: qty 1

## 2017-10-20 MED ORDER — HYDRALAZINE HCL 20 MG/ML IJ SOLN: 5.0000 mg | Freq: Three times a day (TID) | INTRAMUSCULAR | Status: DC | PRN

## 2017-10-20 MED ORDER — HYDRALAZINE HCL 25 MG PO TABS: 25.0000 mg | ORAL_TABLET | Freq: Three times a day (TID) | ORAL | Status: DC

## 2017-10-20 MED ORDER — HYDRALAZINE HCL 25 MG PO TABS
25.0000 mg | ORAL_TABLET | Freq: Three times a day (TID) | ORAL | Status: DC
  Administered 2017-10-20 (×2): 25 mg via ORAL
  Filled 2017-10-20 (×2): qty 1

## 2017-10-20 MED ORDER — ONDANSETRON HCL 4 MG/2ML IJ SOLN
4.0000 mg | Freq: Once | INTRAMUSCULAR | Status: AC
  Administered 2017-10-20: 4 mg via INTRAVENOUS
  Filled 2017-10-20: qty 2

## 2017-10-20 MED ORDER — AMLODIPINE BESYLATE 10 MG PO TABS
10.0000 mg | ORAL_TABLET | Freq: Every day | ORAL | Status: DC
  Administered 2017-10-20 – 2017-10-25 (×6): 10 mg via ORAL
  Filled 2017-10-20 (×5): qty 1
  Filled 2017-10-20: qty 2

## 2017-10-20 MED ORDER — FENTANYL CITRATE (PF) 100 MCG/2ML IJ SOLN
100.0000 ug | Freq: Once | INTRAMUSCULAR | Status: AC
  Administered 2017-10-20: 100 ug via INTRAVENOUS
  Filled 2017-10-20: qty 2

## 2017-10-20 MED ORDER — HEPARIN SODIUM (PORCINE) 5000 UNIT/ML IJ SOLN
5000.0000 [IU] | Freq: Three times a day (TID) | INTRAMUSCULAR | Status: DC
  Administered 2017-10-20 – 2017-10-25 (×12): 5000 [IU] via SUBCUTANEOUS
  Filled 2017-10-20 (×12): qty 1

## 2017-10-20 MED ORDER — ACETAMINOPHEN 650 MG RE SUPP: 650.0000 mg | Freq: Four times a day (QID) | RECTAL | Status: DC | PRN

## 2017-10-20 MED ORDER — POLYETHYLENE GLYCOL 3350 17 G PO PACK
17.0000 g | PACK | Freq: Every day | ORAL | Status: DC | PRN
  Administered 2017-10-20 – 2017-10-23 (×2): 17 g via ORAL
  Filled 2017-10-20 (×2): qty 1

## 2017-10-20 MED ORDER — ONDANSETRON HCL 4 MG/2ML IJ SOLN
4.0000 mg | Freq: Four times a day (QID) | INTRAMUSCULAR | Status: DC | PRN
  Administered 2017-10-20 – 2017-10-21 (×4): 4 mg via INTRAVENOUS
  Filled 2017-10-20 (×5): qty 2

## 2017-10-20 MED ORDER — CARVEDILOL 6.25 MG PO TABS
6.2500 mg | ORAL_TABLET | Freq: Two times a day (BID) | ORAL | Status: DC
  Administered 2017-10-20: 6.25 mg via ORAL
  Filled 2017-10-20: qty 1

## 2017-10-20 MED ORDER — SODIUM CHLORIDE 0.9% FLUSH
3.0000 mL | Freq: Two times a day (BID) | INTRAVENOUS | Status: DC
  Administered 2017-10-20 – 2017-10-25 (×7): 3 mL via INTRAVENOUS

## 2017-10-20 MED ORDER — SENNA 8.6 MG PO TABS
1.0000 | ORAL_TABLET | Freq: Every day | ORAL | Status: DC
  Administered 2017-10-20 – 2017-10-23 (×4): 8.6 mg via ORAL
  Filled 2017-10-20 (×4): qty 1

## 2017-10-20 NOTE — ED Notes (Signed)
Pt arrived in MRI with RN

## 2017-10-20 NOTE — ED Notes (Signed)
Cardene paused at present to check bp without medication; PA aware

## 2017-10-20 NOTE — ED Notes (Signed)
Patient states last use of cocaine 4 weeks ago. Patient denies usage of any other recreational substances.

## 2017-10-20 NOTE — ED Notes (Addendum)
Pt ambulated to the bathroom stated she had a little cramping still in abdomen.

## 2017-10-20 NOTE — H&P (Addendum)
Date: 10/20/2017               Patient Name:  Valerie Mathis MRN: [id number]  DOB: [birth year] Age / Sex: 32 y.o., female   PCP: Patient, No Pcp Per         Medical Service: Internal Medicine Teaching Service         Attending Physician: Dr. Heber Camano, Rachel Moulds, DO    First Contact: Dr. Frederico Hamman  Pager: [phone]  Second Contact: Dr. 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 Patient was in her usual state of health until this morning when she felt a sharp pain in her epigastrium that radiated to her back. She woke up early this morning to go to the bathroom and experienced a sharp epigastric pain after voiding followed by non-bloody emesis twenty minutes later. No loose stools or diarrhea. Last bowel movement 2 days ago. States she was constipated which is not usual for her. Denies recent changes in diet or medications and recent illness. Denies sick contacts, fever, chills, chest pain, SOB, and urinary symptoms. She does report using cocaine, most recently 3-4 weeks ago. States pain is not similar to abdominal pain from aortic dissection in the past, but does feel similar to an ectopic pregnancy. RUQ [place] from 08/2017 showed chronic cholecystitis but patient denies post-prandial symptoms. It also showed echogenic renal parenchyma. [place] of bilateral renal arteries with normal size L and R kidney and no evidence of stenosis. There were high resistant waveforms noted bilaterally.  Does not have a PCP or follows up with vascular surgery or nephrology due to not having insurance.   ED course: Patient  afebrile, non-tachycardic, hypertensive with BP 228/144, and oxygenating well on room air on arrival to the ED. Workup remarkable for mild thrombocytopenia at 142 and Cr 4.1 (baseline 4-4.5). UA with moderate Hgb and proteinuria. Lipase normal. I-stat troponin negative. MRA chest/abdomen/pelvis with stable appearance of aortic dissection and non-specific inflammation of duodenum and jejunum. She was started on nicardipine drip. Vascular surgery consulted in the ED (Dr. Trula Slade) who recommended continuing medical management in the setting of no new neurological deficits or end organ perfusion deficits.   Meds:  Per patient report: Carvedilol, atenolol, clonidine, Lasix, and amlodipine  Per pharmacy: hydralazine 25 mg TID and Lasix 20 mg BID were filled in the past month   Allergies: Allergies as of 10/20/2017  . (No Known Allergies)   Past Medical History:  Diagnosis Date  . Aortic dissection ([place])   . CHF (congestive heart failure) ([place])   . CKD (chronic kidney disease) stage 4, GFR 15-29 ml/min (HCC)   . H/O: alcohol abuse   . Hypertension   . Renal disorder   . TTP (thrombotic thrombocytopenic purpura) (HCC)     Family History:  Family History  Problem Relation Age of Onset  . Hypertension Mother     Social History: Patient reports she drinks alcohol socially as well as tobacco use (1 pack lasts 2 weeks). She also reports cocaine use with last use  3-4 weeks ago. Has a 63 yo son who lives with her. Does not work. Uninsured and therefore unable to establish with PCP, cardiology or nephrology.   Review of Systems: A complete ROS was negative except as per HPI.   Physical Exam: Blood pressure (!) 211/126, pulse 100, temperature 98.9 F (37.2 C), temperature source Oral, resp. rate 15, height 5\' 3"  (1.6 m), weight 125 lb 12.8 oz (57.1 kg), SpO2 100 %.  Physical Exam  Constitutional: She is oriented to person, place, and time.  Well-nourished, well-developed, young female sleeping in  bed in no acute distress   HENT:  Head: Normocephalic and atraumatic.  Mouth/Throat: Oropharynx is clear and moist.  Eyes: Conjunctivae are normal. No scleral icterus.  Neck: Normal range of motion. Neck supple.  Cardiovascular: Normal rate and regular rhythm. Exam reveals no gallop and no friction rub.  Murmur (II/VI SEM beast heard at upper sternal border) heard. Pulmonary/Chest: Effort normal. No respiratory distress.  Abdominal: Soft. There is tenderness (Mild TTP over epigastrium and LUQ with decreased bowel sounds  ).  Musculoskeletal: She exhibits no edema.  Neurological: She is alert and oriented to person, place, and time.  Skin: No rash noted.    EKG: personally reviewed my interpretation is SR, RAE, LVH noted, no signs of acute ischemia   CXR: none obtained   MRA chest/abdomen/pelvis 6/6: IMPRESSION: VASCULAR 1. Stable appearance of ascending thoracic aortic repair without evidence of complication. 2. 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 The maximal transverse diameter is unchanged at 4.2 cm. 3. No evidence of acute abdominal aortic dissection, aneurysm or other arterial abnormality within the limitations of this noncontrast enhanced study. 4. Concentric hypertrophy of the left ventricular myocardium consistent with clinical history of severe systemic hypertension.  NON VASCULAR 1. New onset submucosal edema, bowel wall thickening and mesenteric edema involving the duodenum and proximal jejunum. Differential considerations include both infectious and inflammatory enteritis. 2. Slight interval increase in perihepatic and perisplenic ascites is likely reactive and related to the infectious/inflammatory enteritis described above. 3. Stable small right pleural effusion. 4. Normal MRI appearance of the pancreas.  Assessment & Plan by Problem: Principal  Problem:   Abdominal pain Active Problems:   Chronic kidney disease Stage IV-V   Aortic dissection Stanford type B s/p repair 2018 [facility])   Cocaine use   Uncontrolled hypertension   Hypertensive cardiomyopathy ([place])  # Abdominal pain: Patient presented with acute onset epigastric abdominal pain associated with N/V and findings of bowel wall thickening and mesenteric edema involving the duodenum and proximal jejunum. DDx: gastritis, hypertensive gastropathy, PUD, pancreatitis, chronic cholecystitis. Unlikely IBD. Low suspicion for acute mesenteric ischemia given mild tenderness on abdomen exam and no progression of aortic dissection on imaging though no contrast use for study in the setting of advanced CKD. Will manage conservatively as below.  - CMP in AM  - PO/IV Zofran q6h  - Miralax and Senokot for mild constipation - Advance diet as tolerated  - No IVF in the setting of advanced CKD   # Uncontrolled HTN: Diagnosed at age 81. Previously on Coreg 25 mg BID, Hydralazine 25 mg TID, amlodipine 10 mg QD, and Lasix 20 mg BID. She reports compliance with medications but concern for medication non-compliance. Called pharmacy to confirm medication and it appears she is only taking hydralazine 25 mg TID and Lasix 20 mg.  - Off nicardipine drip  - Continue amlodipine 10 mg QD  -  Continue Hydralazine 25 mg TID  - Holding Lasix and clonidine for now to avoid acute decrease in blood pressure   # History of Stanford type A aortic dissection s/p repair with graft 2018 and chronic type B dissection: Diagnosed in 2018 and thought to be secondary to uncontrolled HTN. Previously on Coreg 25 mg BID which was prescribed on discharge 09/15/2017. However, per pharmacy patient has not refilled this medication.  - On telemetry  - Coreg at a lower dose of 6.25 mg BID with up titration as needed   # CKD Stage IV-V: Uncertain etiology, but suspect secondary to uncontrolled HTN. 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 [place] of bilateral renal arteries with normal size L and R kidney and no evidence of stenosis. There were high resistant waveforms noted bilaterally. Does not follow up with nephrology. Baseline Cr 4-4.5. Renal function currently at baseline. - BMP in AM  - Consider nephrology consult to help in establishing with a nephrologist as well as dialysis evaluation   # Hypertensive cardiomyopathy: Seen on recent TTE on 08/2017. Per report, mild decreased EF. Does not follow up with cardiology.  - On telemetry  - Coreg as above   # Polysubstance abuse: Patient reports alcohol, tobacco and cocaine use (recently 3-4 weeks ago).  - Follow up UDS - Will need counseling on tobacco and cocaine use. Extremely important in the setting of known aortic dissection and uncontrolled HTN  - Nicoderm 7 mg QD  - No CIWA, reports last alcohol drink 1 month ago. Low threshold to order though   # History of LEU DVT: Per chart, previously on Coumadin. Currently not on anticoagulation.   F: none  E: monitoring in the setting of CKD  N: renal diet   VTE ppx: SQ heparin   Code status: Full code, unconfirmed on admission   Dispo: Admit patient to Inpatient with expected length of stay greater than 2 midnights.  SignedWelford Roche, MD 10/20/2017, 3:44 PM  Pager: [phone]

## 2017-10-20 NOTE — ED Provider Notes (Addendum)
[facility] EMERGENCY DEPARTMENT Provider Note   CSN: [id number] Arrival date & time: 10/20/17  0155     History   Chief Complaint Chief Complaint  Patient presents with  . Abdominal Pain    HPI Valerie Mathis is a 32 y.o. female.  The history is provided by the patient and medical records.    32 year old female with history of aortic dissection in 2018 repaired in [place], chronic kidney disease stage IV, hypertension, TTP, presenting to the ED for severe abdominal pain.  States this began around 5PM yesterday, steadily worsening since onset.  Reports pain is epigastric and radiates straight through to her back.  She reports associated nausea but denies emesis.  No chest pain, SOB, diaphoresis.  She denies any numbness/weakness of her extremities.  States since her repair last year she has been doing fairly well.  She states she has had some intermittent episodes of pain since then, but never to this degree.  She did have an MRI in April of this year that was normal, no issues with repair but does have area of chronic dissection.  Past Medical History:  Diagnosis Date  . Aortic dissection ([place])   . CHF (congestive heart failure) ([place])   . CKD (chronic kidney disease) stage 4, GFR 15-29 ml/min (HCC)   . H/O: alcohol abuse   . Hypertension   . Renal disorder   . TTP (thrombotic thrombocytopenic purpura) [facility])     Patient Active Problem List   Diagnosis Date Noted  . Aortic dissection ([place])   . Hypertensive heart disease without heart failure   . Cocaine use   . Hypertensive emergency 09/12/2017  . Back pain 09/12/2017  . Tachycardia 09/12/2017  . Chronic kidney disease   . 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 ECTOPIC PREGNANCY SURGERY  2018  . REPAIR OF ACUTE ASCENDING THORACIC AORTIC DISSECTION       OB History   None      Home Medications    Prior to Admission medications   Medication Sig  Start Date End Date Taking? Authorizing Provider  amLODipine (NORVASC) 10 MG tablet Take 1 tablet (10 mg total) by mouth daily. for high blood pressure 09/15/17   Amin, Jeanella Flattery, MD  calcitRIOL (ROCALTROL) 0.25 MCG capsule Take 1 capsule (0.25 mcg total) by mouth daily. 09/16/17   Amin, Jeanella Flattery, MD  carvedilol (COREG) 25 MG tablet Take 1 tablet (25 mg total) by mouth 2 (two) times daily. For blood pressure/heart 09/15/17   Amin, Jeanella Flattery, MD  cloNIDine (CATAPRES) 0.3 MG tablet Take 1 tablet (0.3 mg total) by mouth 3 (three) times daily. For high blood pressure 09/15/17   Amin, Jeanella Flattery, MD  ferrous sulfate 325 (65 FE) MG tablet Take 1 tablet (325 mg total) by mouth 2 (two) times daily with a meal. 09/15/17   Amin, Jeanella Flattery, MD  furosemide (LASIX) 20 MG tablet Take 1 tablet (20 mg total) by mouth 2 (two) times daily. For high blood pressure/fluid 09/15/17   Amin, Jeanella Flattery, MD  hydrALAZINE (APRESOLINE) 25 MG tablet Take 1 tablet (25 mg total) by mouth 3 (three) times daily. For high blood pressure 09/15/17   Amin, Jeanella Flattery, MD  hydrALAZINE (APRESOLINE) 50 MG tablet Take 1 tablet (50 mg total) by mouth 3 (three) times daily. 09/15/17   Amin, Jeanella Flattery, MD  isosorbide mononitrate (IMDUR) 30 MG 24 hr tablet Take 1 tablet (  30 mg total) by mouth daily. 09/16/17   Amin, Jeanella Flattery, MD  polyethylene glycol (MIRALAX) packet Take 17 g by mouth daily as needed for moderate constipation or severe constipation. 09/15/17   Damita Lack, MD    Family History Family History  Problem Relation Age of Onset  . Hypertension Mother     Social History Social History   Tobacco Use  . Smoking status: Current Every Day Smoker    Packs/day: 0.50    Years: 4.00    Pack years: 2.00    Types: Cigarettes  . Smokeless tobacco: Never Used  Substance Use Topics  . Alcohol use: Yes    Comment: OCCASIONAL  . Drug use: Yes    Types: Cocaine, Benzodiazepines     Allergies   Patient has no known  allergies.   Review of Systems Review of Systems  Gastrointestinal: Positive for abdominal pain.  Musculoskeletal: Positive for back pain.  All other systems reviewed and are negative.    Physical Exam Updated Vital Signs BP (!) 228/144   Pulse 100   Temp 99.1 F (37.3 C)   Resp 20   SpO2 100%   Physical Exam  Constitutional: She is oriented to person, place, and time. She appears well-developed and well-nourished.  Sitting in bed, doubled over  HENT:  Head: Normocephalic and atraumatic.  Mouth/Throat: Oropharynx is clear and moist.  Eyes: Pupils are equal, round, and reactive to light. Conjunctivae and EOM are normal.  Neck: Normal range of motion.  Cardiovascular: Normal rate, regular rhythm and normal heart sounds.  No murmur heard. Normal DP pulses bilaterally, normal perfusion distally  Pulmonary/Chest: Effort normal and breath sounds normal. She has no decreased breath sounds. She has no wheezes. She has no rhonchi.  Abdominal: Soft. Bowel sounds are normal. There is generalized tenderness.  No rigidity, normal bowel sounds  Musculoskeletal: Normal range of motion.  Neurological: She is alert and oriented to person, place, and time.  AAOx3, moving arms and legs without issue, ambulatory with steady gait  Skin: Skin is warm and dry.  Psychiatric: She has a normal mood and affect.  Nursing note and vitals reviewed.    ED Treatments / Results  Labs (all labs ordered are listed, but only abnormal results are displayed) Labs Reviewed  COMPREHENSIVE METABOLIC PANEL - Abnormal; Notable for the following components:      Result Value   CO2 19 (*)    BUN 30 (*)    Creatinine, Ser 4.21 (*)    Calcium 8.8 (*)    Alkaline Phosphatase 180 (*)    Total Bilirubin 1.4 (*)    GFR calc non Af Amer 13 (*)    GFR calc Af Amer 15 (*)    All other components within normal limits  CBC - Abnormal; Notable for the following components:   RBC 5.33 (*)    RDW 23.7 (*)     Platelets 142 (*)    All other components within normal limits  URINALYSIS, ROUTINE W REFLEX MICROSCOPIC - Abnormal; Notable for the following components:   APPearance HAZY (*)    Glucose, UA 50 (*)    Hgb urine dipstick MODERATE (*)    Protein, ur >=300 (*)    Leukocytes, UA SMALL (*)    Bacteria, UA RARE (*)    All other components within normal limits  I-STAT CHEM 8, ED - Abnormal; Notable for the following components:   BUN 42 (*)    Creatinine, Ser 4.10 (*)  Calcium, Ion 1.02 (*)    TCO2 20 (*)    Hemoglobin 16.3 (*)    HCT 48.0 (*)    All other components within normal limits  LIPASE, BLOOD  RAPID URINE DRUG SCREEN, HOSP PERFORMED  I-STAT BETA HCG BLOOD, ED ([facility], [facility], [facility] ONLY)  I-STAT TROPONIN, ED    EKG None  Radiology No results found.  Procedures Procedures (including critical care time)  CRITICAL CARE Performed by: Larene Pickett   Total critical care time: 45 minutes  Critical care time was exclusive of separately billable procedures and treating other patients.  Critical care was necessary to treat or prevent imminent or life-threatening deterioration.  Critical care was time spent personally by me on the following activities: development of treatment plan with patient and/or surrogate as well as nursing, discussions with consultants, evaluation of patient's response to treatment, examination of patient, obtaining history from patient or surrogate, ordering and performing treatments and interventions, ordering and review of laboratory studies, ordering and review of radiographic studies, pulse oximetry and re-evaluation of patient's condition.   Medications Ordered in ED Medications  nicardipine (CARDENE) 20mg  in 0.86% saline 252ml IV infusion (0.1 mg/ml) (10 mg/hr Intravenous New Bag/Given 10/20/17 0607)  ondansetron (ZOFRAN) injection 4 mg (4 mg Intravenous Given 10/20/17 0309)  fentaNYL (SUBLIMAZE) injection 50 mcg (50 mcg Intravenous Given 10/20/17 0309)      Initial Impression / Assessment and Plan / ED Course  I have reviewed the triage vital signs and the nursing notes.  Pertinent labs & imaging results that were available during my care of the patient were reviewed by me and considered in my medical decision making (see chart for details).  32 year old female here with epigastric abdominal pain radiating through to the back.  Has history of aortic dissection, reports this feels similar.  Blood pressure is significantly elevated--reports she has missed her last 2 doses of medication as she is not feeling well.  Abdomen soft, tenderness in epigastrium without rebound or guarding.  No rubs or murmurs heard.  She remains neurologically intact, normal distal perfusion to all 4 extremities.  Ambulatory with steady gait.  Denies chest pain, shortness of breath, palpitations, dizziness, weakness.  Chemistry panel with creatinine of 4.10 which is around patient's baseline.  Unfortunately, because of this cannot get CTA to r/o dissection.  Has had prior MRI earlier this year, tolerated that fairly well but do have concerns about time frame it would take to get MRI.    2:43 AM Called and discussed with radiology, MRI best bet at this point for definitive imaging.  Will not be able to see everything needed if get CTA w/o contrast.  Risk of IV contrast is very high given she is already stage IV CKD.    2:49 AM Bedside US performed by Dr. Randal Buba-- appears to be small flap on [place].  On last MRI 09/13/17 patient with some continued chronic dissection as well as hematoma.  Question if flap from chronic vs new area of dissection. Start cardene for BP control (patient has hx of cocaine abuse so will avoid beta blockers).  Consulted vascular surgery.  3:35 AM Spoke with Vascular, Dr. Trula Slade-- given patient has history of chronic dissection, would likely be managed medically in this setting.  As she is not displaying any neurologic deficits or signs of end organ  perfusion deficit at this time, feels ok to get her BP and pain controlled, and get MRI as there is eminent risk to her remaining kidney  function if IV contrast used.  Will call him back with any emergent changes/concerns.  I have called and spoken with MRI, they will take her next.  4:02 AM Patient remains stable at this time.  BP trending down nicely.  Remains without neurologic deficits.  5:07 AM Patient still awaiting MRI.  Still neurologically intact.  BP still better controlled.  5:36 AM MRI was contacted at 5am, reported another 20 mins.  Still waiting at this time.  Patient asleep in room, appears comfortable.  BP stable in the 711'A systolic.  Will continue to monitor.  5:57 AM MRI tech here reporting he is not comfortable doing MRI of the chest as he is not familiar with protocol.  Discussed that this study is emergent and cannot wait.  Dr. Randal Buba also discussed with Radiologist, study to be done ASAP.  BP back up to 579'U systolic.  New bag of nicardipine started.  Patient will need admission for ongoing BP control regardless of MRI results.  6:43 AM Patient in MRI getting scans.  Care signed out to PA Lawyer at shift change.  Will follow-up on MRI scans, discuss with vascular if needed, and ultimately admit for BP control.  Final Clinical Impressions(s) / ED Diagnoses   Final diagnoses:  Epigastric pain  Essential hypertension  Non compliance w medication regimen    ED Discharge Orders    None       Larene Pickett, PA-C 10/20/17 0646    Larene Pickett, PA-C 10/20/17 3833    Randal Buba, April, MD 10/20/17 3832

## 2017-10-20 NOTE — ED Notes (Signed)
Pt now awake, no complaint of pain. BP read 213/104, BP cuff changed to an adult cuff and now reading 178/107. Pt has no complaints.

## 2017-10-20 NOTE — Plan of Care (Signed)
  Problem: Education: Goal: Knowledge of General Education information will improve Outcome: Completed/Met   Problem: Activity: Goal: Risk for activity intolerance will decrease Outcome: Completed/Met

## 2017-10-20 NOTE — ED Notes (Signed)
ADMITTING PAGED 

## 2017-10-20 NOTE — ED Notes (Signed)
Contacted MRI for delay, reports 51mins before pt will be able to go over for imaging. Informed MRI, RN would be coming with pt

## 2017-10-20 NOTE — ED Notes (Signed)
Cardene restarted

## 2017-10-20 NOTE — Plan of Care (Signed)
Adjusting anti hypertensive meds. Monitor labs.

## 2017-10-20 NOTE — ED Triage Notes (Signed)
Pt states that she began to have sudden severe upper abdominal pain that radiates to her back, along with n/v, denies diarrhea/fevers/dysuria. Pt very hypertensive at 330 systolic, reports that she missed that last two doses due to pain, hx of aortic dissection

## 2017-10-21 DIAGNOSIS — F1410 Cocaine abuse, uncomplicated: Secondary | ICD-10-CM

## 2017-10-21 DIAGNOSIS — A084 Viral intestinal infection, unspecified: Secondary | ICD-10-CM

## 2017-10-21 DIAGNOSIS — Z9889 Other specified postprocedural states: Secondary | ICD-10-CM

## 2017-10-21 DIAGNOSIS — Z86718 Personal history of other venous thrombosis and embolism: Secondary | ICD-10-CM

## 2017-10-21 LAB — COMPREHENSIVE METABOLIC PANEL
ALK PHOS: 133 U/L — AB (ref 38–126)
ALT: 10 U/L — AB (ref 14–54)
AST: 18 U/L (ref 15–41)
Albumin: 2.9 g/dL — ABNORMAL LOW (ref 3.5–5.0)
Anion gap: 8 (ref 5–15)
BUN: 37 mg/dL — ABNORMAL HIGH (ref 6–20)
CALCIUM: 8.2 mg/dL — AB (ref 8.9–10.3)
CO2: 17 mmol/L — ABNORMAL LOW (ref 22–32)
CREATININE: 4.22 mg/dL — AB (ref 0.44–1.00)
Chloride: 112 mmol/L — ABNORMAL HIGH (ref 101–111)
GFR, EST AFRICAN AMERICAN: 15 mL/min — AB (ref >60)
GFR, EST NON AFRICAN AMERICAN: 13 mL/min — AB (ref >60)
Glucose, Bld: 106 mg/dL — ABNORMAL HIGH (ref 65–99)
Potassium: 4.4 mmol/L (ref 3.5–5.1)
SODIUM: 137 mmol/L (ref 135–145)
Total Bilirubin: 0.9 mg/dL (ref 0.3–1.2)
Total Protein: 5.7 g/dL — ABNORMAL LOW (ref 6.5–8.1)

## 2017-10-21 MED ORDER — CARVEDILOL 12.5 MG PO TABS
12.5000 mg | ORAL_TABLET | Freq: Two times a day (BID) | ORAL | Status: DC
  Administered 2017-10-21 (×2): 12.5 mg via ORAL
  Filled 2017-10-21 (×2): qty 1

## 2017-10-21 MED ORDER — GI COCKTAIL ~~LOC~~
30.0000 mL | Freq: Three times a day (TID) | ORAL | Status: AC | PRN
  Administered 2017-10-21 – 2017-10-23 (×3): 30 mL via ORAL
  Filled 2017-10-21 (×4): qty 30

## 2017-10-21 MED ORDER — BISACODYL 10 MG RE SUPP
10.0000 mg | Freq: Once | RECTAL | Status: AC
  Administered 2017-10-21: 10 mg via RECTAL
  Filled 2017-10-21: qty 1

## 2017-10-21 MED ORDER — HYDRALAZINE HCL 50 MG PO TABS
50.0000 mg | ORAL_TABLET | Freq: Three times a day (TID) | ORAL | Status: DC
  Administered 2017-10-21 – 2017-10-25 (×13): 50 mg via ORAL
  Filled 2017-10-21 (×13): qty 1

## 2017-10-21 MED ORDER — TRAMADOL HCL 50 MG PO TABS
50.0000 mg | ORAL_TABLET | Freq: Once | ORAL | Status: AC | PRN
  Administered 2017-10-21: 50 mg via ORAL
  Filled 2017-10-21: qty 1

## 2017-10-21 NOTE — Progress Notes (Addendum)
Subjective:  Complaining of ongoing abdominal pain. Received tramadol 50 mg x1 with improvement in pain. This morning she continues to complain of abdominal pain that she localizes to the RUQ and R lateral abdominal wall. No further emesis or loose stools.   Objective:  Vital signs in last 24 hours: Vitals:   10/20/17 1817 10/20/17 2035 10/21/17 0510 10/21/17 0835  BP: (!) 168/108 (!) 189/116 (!) 186/113 (!) 203/128  Pulse:  95 96 94  Resp:  (!) 22 16   Temp:  98.9 F (37.2 C) 97.9 F (36.6 C)   TempSrc:  Oral Oral   SpO2:  100% 96%   Weight:   124 lb 6.4 oz (56.4 kg)   Height:       Physical Exam  Constitutional: She is oriented to person, place, and time.  Well-nourished, well-developed young female sitting up in bed. Noted to be bend over for comfort due to abdominal pain   Cardiovascular: Normal rate and regular rhythm. Exam reveals no gallop and no friction rub.  Murmur (II/VI SEM unchanged from yesterday) heard. Pulmonary/Chest: Effort normal and breath sounds normal. No respiratory distress. She has no wheezes. She has no rales.  Abdominal:  Abdomen is soft and tender to palpation over epigastrium. No guarding or rebound. Normoactive bowel sounds.   Musculoskeletal: She exhibits no edema.  Neurological: She is alert and oriented to person, place, and time.    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   # Abdominal pain 2/2 viral gastroenteritis: Patient continues to complain of abdominal pain this AM. No further N/V or loose stools. Reports improvement with tramadol. Will continue conservative management a below. Added GI cocktail as would like to avoid narcotics in patient with know polysubstance abuse.    - GI cocktail TID PRN  - PO/IV Zofran q6h  - Miralax and Senokot for mild constipation - Advance diet as tolerated  - No IVF in the setting of advanced CKD   # Uncontrolled HTN: sBP 170-200. Will continue adjusting BP regimen as below for optimal BP control in the setting of know aortic dissection and advanced CKD.  - Increase Hydralazine 25 mg TID --> 50 mg TID  - Increase Coreg 6.25 mg BID--> 12.5 mg BID, up titration as needed  - Continue amlodipine 10 mg QD   - Holding Lasix and clonidine for now to avoid acute decrease in blood pressure - Discussed with patient importance of optimal BP control as well as counseled her cocaine cessation   # History of Stanford type A aortic dissection s/p repair with graft 2018 and chronic type B dissection:  - On telemetry  - Increase Coreg 6.25 mg BID--> 12.5 mg BID, up titration as needed   # CKD Stage IV-V: Renal function remains at baseline. No electrolyte abnormalities and euvolemic on exam.   #  Hypertensive cardiomyopathy: Does not follow up with cardiology.  - On telemetry  - BP management as above   # Polysubstance abuse:  - UDS positive for cocaine  - Will need counseling on tobacco and cocaine use. Extremely important in the setting of known aortic dissection and uncontrolled HTN  - Nicoderm 7 mg QD    Dispo: Anticipated discharge in approximately 1 day(s) pending optimization of BP medication regimen.    Welford Roche, MD 10/21/2017, 9:33 AM Pager: [phone]

## 2017-10-22 ENCOUNTER — Inpatient Hospital Stay (HOSPITAL_COMMUNITY): Payer: Medicaid Other

## 2017-10-22 DIAGNOSIS — Z9114 Patient's other noncompliance with medication regimen: Secondary | ICD-10-CM

## 2017-10-22 DIAGNOSIS — R109 Unspecified abdominal pain: Secondary | ICD-10-CM

## 2017-10-22 LAB — HEPATIC FUNCTION PANEL
ALBUMIN: 1.3 g/dL — AB (ref 3.5–5.0)
ALK PHOS: 55 U/L (ref 38–126)
ALT: 5 U/L — AB (ref 14–54)
AST: 9 U/L — AB (ref 15–41)
Bilirubin, Direct: 0.2 mg/dL (ref 0.1–0.5)
Indirect Bilirubin: 0.1 mg/dL — ABNORMAL LOW (ref 0.3–0.9)
Total Bilirubin: 0.3 mg/dL (ref 0.3–1.2)

## 2017-10-22 LAB — BASIC METABOLIC PANEL
ANION GAP: 11 (ref 5–15)
BUN: 42 mg/dL — AB (ref 6–20)
CALCIUM: 8.5 mg/dL — AB (ref 8.9–10.3)
CO2: 18 mmol/L — ABNORMAL LOW (ref 22–32)
Chloride: 105 mmol/L (ref 101–111)
Creatinine, Ser: 4.17 mg/dL — ABNORMAL HIGH (ref 0.44–1.00)
GFR calc Af Amer: 16 mL/min — ABNORMAL LOW (ref >60)
GFR, EST NON AFRICAN AMERICAN: 13 mL/min — AB (ref >60)
Glucose, Bld: 96 mg/dL (ref 65–99)
POTASSIUM: 4.9 mmol/L (ref 3.5–5.1)
SODIUM: 134 mmol/L — AB (ref 135–145)

## 2017-10-22 LAB — LACTIC ACID, PLASMA: LACTIC ACID, VENOUS: 0.5 mmol/L (ref 0.5–1.9)

## 2017-10-22 MED ORDER — HYDROMORPHONE HCL 1 MG/ML IJ SOLN
0.5000 mg | INTRAMUSCULAR | Status: DC | PRN
  Administered 2017-10-22 – 2017-10-23 (×3): 0.5 mg via INTRAVENOUS
  Filled 2017-10-22 (×3): qty 1

## 2017-10-22 MED ORDER — SODIUM CHLORIDE 0.9 % IV SOLN
INTRAVENOUS | Status: AC
  Administered 2017-10-22 (×2): via INTRAVENOUS

## 2017-10-22 MED ORDER — POLYETHYLENE GLYCOL 3350 17 G PO PACK
17.0000 g | PACK | Freq: Two times a day (BID) | ORAL | Status: AC
  Filled 2017-10-22 (×2): qty 1

## 2017-10-22 MED ORDER — CARVEDILOL 25 MG PO TABS
25.0000 mg | ORAL_TABLET | Freq: Two times a day (BID) | ORAL | Status: DC
  Administered 2017-10-22 – 2017-10-24 (×5): 25 mg via ORAL
  Filled 2017-10-22 (×5): qty 1

## 2017-10-22 MED ORDER — RAMELTEON 8 MG PO TABS
8.0000 mg | ORAL_TABLET | Freq: Every day | ORAL | Status: DC
  Administered 2017-10-22 – 2017-10-24 (×4): 8 mg via ORAL
  Filled 2017-10-22 (×4): qty 1

## 2017-10-22 MED ORDER — PANTOPRAZOLE SODIUM 40 MG PO TBEC
40.0000 mg | DELAYED_RELEASE_TABLET | Freq: Every day | ORAL | Status: DC
  Administered 2017-10-22 – 2017-10-25 (×4): 40 mg via ORAL
  Filled 2017-10-22 (×4): qty 1

## 2017-10-22 NOTE — Plan of Care (Signed)
  Problem: Nutrition: Goal: Adequate nutrition will be maintained Outcome: Progressing   Problem: Skin Integrity: Goal: Risk for impaired skin integrity will decrease Outcome: Progressing   Encouraged PO intake.

## 2017-10-22 NOTE — Plan of Care (Signed)
Cont with nausea and abdominal pain. Abd xray and U/S of abd done. Dilaudid ordered for pain control.

## 2017-10-22 NOTE — Progress Notes (Signed)
Subjective:   Patient frustrated this morning and continues to complain of constant abdominal pain which continues to localize in the epigastrium and RUQ. Reports she's been unable to sleep due to the pain and that laying down makes nausea worse. States the only thing that's helped was the IV pain medication given in the ED which worked "immediately." When asked if pain correlates with PO intake, she states she has not been able to tolerate "any" PO intake (despite actively drinking water while stating this). Small BM after suppository overnight.   Objective:  Vital signs in last 24 hours: Vitals:   10/21/17 1456 10/21/17 1555 10/21/17 2003 10/22/17 0359  BP: (!) 153/110 (!) 172/111 (!) 165/120 (!) 174/109  Pulse: 90 92 98 95  Resp:   17 15  Temp:   98.1 F (36.7 C) 97.9 F (36.6 C)  TempSrc:   Oral Oral  SpO2: 100%  100% 98%  Weight:    118 lb 14.4 oz (53.9 kg)  Height:       Physical Exam  Constitutional: She is oriented to person, place, and time.  Well-nourished, well-developed young female sitting up in bed. Frustrated, uncomfortable  Cardiovascular: Normal rate and regular rhythm. Exam reveals no gallop and no friction rub.  Murmur (II/VI SEM unchanged from yesterday) heard. Pulmonary/Chest: Effort normal and breath sounds normal. No respiratory distress. She has no wheezes. She has no rales.  Abdominal:  Abdomen is soft and tender to palpation over epigastrium. No guarding or rebound. Normoactive bowel sounds.   Musculoskeletal: She exhibits no edema.  Neurological: She is alert and oriented to person, place, and time.   MRA chest/abdomen/pelvis 10/20/17 VASCULAR 1. Stable appearance of ascending thoracic aortic repair without evidence of complication. 2. 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 The maximal transverse diameter is unchanged at 4.2 cm. 3. No evidence  of acute abdominal aortic dissection, aneurysm or other arterial abnormality within the limitations of this noncontrast enhanced study. 4. Concentric hypertrophy of the left ventricular myocardium consistent with clinical history of severe systemic hypertension.  NON VASCULAR  1. New onset submucosal edema, bowel wall thickening and mesenteric edema involving the duodenum and proximal jejunum. Differential considerations include both infectious and inflammatory enteritis. 2. Slight interval increase in perihepatic and perisplenic ascites is likely reactive and related to the infectious/inflammatory enteritis described above. 3. Stable small right pleural effusion. 4. Normal MRI appearance of the pancreas  Abdominal US 09/13/17: Cholelithiasis (75mm largest) and wall-thickening (78mm) with trace pericholecystic fluid suggesting chronic cholecystitis.   Assessment/Plan:  Principal Problem:   Abdominal pain Active Problems:   Chronic kidney disease Stage IV-V   Aortic dissection Stanford type B s/p repair 2018 [facility])   Cocaine use   Uncontrolled hypertension   Hypertensive cardiomyopathy (HCC)   History of LUE DVT (deep vein thrombosis)  Valerie Mathis is a 32 yo female with history of chronic cholecystitis, chronic aortic dissection Stanford type A s/p repair with graft in 2018 in [place] and type B dissection as well secondary to uncontrolled HTN, CKD Satge IV, and TTP who presents to the ED with 1-day history of abdominal pain associated with NBNB emesis. MR abdomen/pelvis with slight interval increase in perihepatic and perisplenic ascites, abnormal small bowel from duodenum to jejunum with submucosal edema, thickening of valvulae and edema within associated mesenteries.   #Abdominal pain: Continues to complain of abdominal pain which apparently has not improved since admission. Tells me "  nothing" has helped her pain except for the IV fentanyl given in the ED which helped  "immediately." Tells me she's been unable to tolerate PO intake although was drinking water without issue during our conversation. Pt notes history of similar episode but was less severe during her last admission. Abdominal US at that time with cholelithiasis, thickened GB wall (49mm) and pericholecystic fluid suggesting chronic cholecystitis. She denies BM since admission.  Differential still includes hypertensive gastropathy, viral gastroenteritis, gastritis however believe symptomatic chronic cholecystitis, ischemic enteritis due to severe HTN or even Chrons disease should be considered as well.  -KUB as patient without BM or flatus -RUQ [place] today to evaluate gallbladder, may benefit from HIDA -Added LFTs to todays AM labs -Lactic acid to evaluate for ischemia -Zofran, GI cocktails prn -Added PPI -Miralax/Senokot for constipation -Gentle IVF given poor PO intake  # Uncontrolled HTN: Blood pressures improved but still 160's-170's/100s-120's. Will continue adjusting medications for optimal BP control in setting of known aortic dissection and advanced CKD.  -Increase Coreg to 25mg  BID this morning -Continue Hydral 50mg  TID and Amlodipine 10mg  -Holding lasix and clonidine [pt non-compliant with these at home]  # History of Stanford type A aortic dissection s/p repair with graft 2018 and chronic type B dissection:  BP control as above. Maximizing BB as able  # CKD Stage IV-V: Renal function remains at baseline. No electrolyte abnormalities and euvolemic on exam.   # Hypertensive cardiomyopathy: Does not follow up with cardiology.  - On telemetry  - BP management as above   # Polysubstance abuse:  - UDS positive for cocaine  - Will need counseling on tobacco and cocaine use. Extremely important in the setting of known aortic dissection and uncontrolled HTN  - Nicoderm 7 mg QD   Dispo: Anticipated discharge in approximately 1-2 day(s) pending improved PO intake and optimization of BP  medication regimen.    [name], DO 10/22/2017, 9:18 AM Pager: [phone]

## 2017-10-23 DIAGNOSIS — Z8679 Personal history of other diseases of the circulatory system: Secondary | ICD-10-CM

## 2017-10-23 DIAGNOSIS — Z959 Presence of cardiac and vascular implant and graft, unspecified: Secondary | ICD-10-CM

## 2017-10-23 LAB — COMPREHENSIVE METABOLIC PANEL
ALBUMIN: 2.9 g/dL — AB (ref 3.5–5.0)
ALT: 10 U/L — AB (ref 14–54)
AST: 18 U/L (ref 15–41)
Alkaline Phosphatase: 118 U/L (ref 38–126)
Anion gap: 8 (ref 5–15)
BUN: 39 mg/dL — AB (ref 6–20)
CHLORIDE: 111 mmol/L (ref 101–111)
CO2: 16 mmol/L — ABNORMAL LOW (ref 22–32)
CREATININE: 4.03 mg/dL — AB (ref 0.44–1.00)
Calcium: 7.9 mg/dL — ABNORMAL LOW (ref 8.9–10.3)
GFR calc Af Amer: 16 mL/min — ABNORMAL LOW (ref >60)
GFR calc non Af Amer: 14 mL/min — ABNORMAL LOW (ref >60)
GLUCOSE: 95 mg/dL (ref 65–99)
POTASSIUM: 4.6 mmol/L (ref 3.5–5.1)
Sodium: 135 mmol/L (ref 135–145)
Total Bilirubin: 0.8 mg/dL (ref 0.3–1.2)
Total Protein: 5.4 g/dL — ABNORMAL LOW (ref 6.5–8.1)

## 2017-10-23 LAB — CBC WITH DIFFERENTIAL/PLATELET
BASOS ABS: 0.1 10*3/uL (ref 0.0–0.1)
Basophils Relative: 1 %
EOS PCT: 2 %
Eosinophils Absolute: 0.1 10*3/uL (ref 0.0–0.7)
HEMATOCRIT: 41.5 % (ref 36.0–46.0)
Hemoglobin: 13.4 g/dL (ref 12.0–15.0)
Lymphocytes Relative: 13 %
Lymphs Abs: 0.8 10*3/uL (ref 0.7–4.0)
MCH: 27.9 pg (ref 26.0–34.0)
MCHC: 32.3 g/dL (ref 30.0–36.0)
MCV: 86.3 fL (ref 78.0–100.0)
MONO ABS: 0.4 10*3/uL (ref 0.1–1.0)
MONOS PCT: 7 %
NEUTROS PCT: 77 %
Neutro Abs: 4.9 10*3/uL (ref 1.7–7.7)
PLATELETS: 88 10*3/uL — AB (ref 150–400)
RBC: 4.81 MIL/uL (ref 3.87–5.11)
RDW: 23.9 % — ABNORMAL HIGH (ref 11.5–15.5)
WBC: 6.3 10*3/uL (ref 4.0–10.5)

## 2017-10-23 MED ORDER — CALCITRIOL 0.25 MCG PO CAPS
0.2500 ug | ORAL_CAPSULE | Freq: Every day | ORAL | Status: DC
  Administered 2017-10-23 – 2017-10-25 (×3): 0.25 ug via ORAL
  Filled 2017-10-23 (×3): qty 1

## 2017-10-23 MED ORDER — SODIUM BICARBONATE 650 MG PO TABS
650.0000 mg | ORAL_TABLET | Freq: Two times a day (BID) | ORAL | Status: DC
  Administered 2017-10-23 – 2017-10-25 (×5): 650 mg via ORAL
  Filled 2017-10-23 (×5): qty 1

## 2017-10-23 MED ORDER — TRAMADOL HCL 50 MG PO TABS
50.0000 mg | ORAL_TABLET | Freq: Two times a day (BID) | ORAL | Status: DC | PRN
  Administered 2017-10-23: 50 mg via ORAL
  Filled 2017-10-23: qty 1

## 2017-10-23 NOTE — Plan of Care (Signed)
Adjusting BP meds. SBP improved to 160's.

## 2017-10-23 NOTE — Plan of Care (Signed)
  Problem: Safety: Goal: Ability to remain free from injury will improve Outcome: Progressing   

## 2017-10-23 NOTE — Progress Notes (Addendum)
Subjective:   Valerie Mathis was seen and evaluated at bedside. She reports her abdominal pain is much improved however is complaining of significant menstrual cramping. Has not tried any PO intake in the past day or so, and is agreeable to trial today and probable DC tomorrow if able to tolerate. Discussed importance of establishing with a PCP as well.   Objective:  Vital signs in last 24 hours: Vitals:   10/21/17 2003 10/22/17 0359 10/22/17 2006 10/23/17 0443  BP: (!) 165/120 (!) 174/109 (!) 142/75 (!) 165/91  Pulse: 98 95 86 91  Resp: 17 15 20 20   Temp: 98.1 F (36.7 C) 97.9 F (36.6 C) 97.9 F (36.6 C) 98.4 F (36.9 C)  TempSrc: Oral Oral Oral Oral  SpO2: 100% 98% 98% 100%  Weight:  118 lb 14.4 oz (53.9 kg)  125 lb 1.6 oz (56.7 kg)  Height:       Physical Exam  Constitutional:  Well-nourished, well-developed young female sitting up in bed. Frustrated, uncomfortable  Cardiovascular: Normal rate and regular rhythm. Exam reveals no gallop and no friction rub.  Murmur (II/VI SEM unchanged) heard. Pulmonary/Chest: Effort normal and breath sounds normal. No respiratory distress. She has no wheezes. She has no rales.  Abdominal:  Abdomen is soft, not distended. Mild TTP epigastrium, improved.   Musculoskeletal: She exhibits no edema.   Assessment/Plan:  Principal Problem:   Abdominal pain Active Problems:   Chronic kidney disease Stage IV-V   Aortic dissection Stanford type B s/p repair 2018 [facility])   Cocaine use   Uncontrolled hypertension   Hypertensive cardiomyopathy (HCC)   History of LUE DVT (deep vein thrombosis)  Valerie Mathis is a 32 yo female with history of chronic cholecystitis, chronic aortic dissection Stanford type A s/p repair with graft in 2018 in [place] and type B dissection as well secondary to uncontrolled HTN, CKD Satge IV, and TTP who presents to the ED with 1-day history of abdominal pain associated with NBNB emesis. MR abdomen/pelvis with slight interval  increase in perihepatic and perisplenic ascites, abnormal small bowel from duodenum to jejunum with submucosal edema, thickening of valvulae and edema within associated mesenteries.   #Abdominal pain: Abdominal pain has improved with supportive care. She's required little prn pain medication and denied further nausea. KUB and RUQ [place] from yesterday unrevealing but reassuring as no bowel obstruction or cholecystitis. Suspect viral gastroenteritis vs hypertensive gastropathy. Patient up to trial of PO intake and likely dc home tomorrow.  -Zofran, GI cocktails prn -PPI -Miralax/Senokot for constipation -DC pain med  #Uncontrolled HTN, Hypertensive Cardiomyopathy: Blood pressures improved with increased Coreg yesterday [BP 142/75 overnight, 165/91 this AM]. Currently on Coreg 25mg  BID, Hydral 50mg  TID and Amlodipine 10mg  and suspect will take several more days to see full effect from restarting these medications. While she needs tight BP control,  I'm apprehensive to resume clonidine given her compliance issues and lasix as she is euvolemic on exam.  -Continue Coreg 25mg  BID, Hydral 50mg  TID and Amlodipine 10mg  -Holding lasix and clonidine [pt non-compliant with these at home]. Could consider resuming these at her HFU apt if compliance improved  # History of Stanford type A aortic dissection s/p repair with graft 2018 and chronic type B dissection:  BP control as above. Maximizing BB as able  #Metabolic Acidosis #CKD Stage IV-V: Cr remains stable. Has had progressive decline in serum bicarb, now 16 however without anion gap. Suspect related to CKD. Not on bicarb replacement at home but chart review  does show she is chronically acidotic.  -Will start sodium bicarb 650 mg BID -Also resuming Calcitriol 0.43mcg daily as well.  -Renal function panel tomorrow AM  #Polysubstance abuse:  -UDS positive for cocaine. Counseled on cessation; Extremely important in the setting of known chronic aortic  dissection and uncontrolled HTN  -Nicoderm 7 mg QD   Diet: Renal DVT Ppx: Heparin - Plts 88k, will repeat in AM  Dispo: Anticipated discharge tomorrow pending good PO intake.   Daysie Helf, DO 10/23/2017, 12:06 PM Pager: [phone]

## 2017-10-24 DIAGNOSIS — E872 Acidosis: Secondary | ICD-10-CM

## 2017-10-24 DIAGNOSIS — F1910 Other psychoactive substance abuse, uncomplicated: Secondary | ICD-10-CM

## 2017-10-24 DIAGNOSIS — K529 Noninfective gastroenteritis and colitis, unspecified: Secondary | ICD-10-CM

## 2017-10-24 LAB — RENAL FUNCTION PANEL
ANION GAP: 9 (ref 5–15)
Albumin: 2.8 g/dL — ABNORMAL LOW (ref 3.5–5.0)
BUN: 34 mg/dL — AB (ref 6–20)
CHLORIDE: 110 mmol/L (ref 101–111)
CO2: 17 mmol/L — ABNORMAL LOW (ref 22–32)
Calcium: 8.1 mg/dL — ABNORMAL LOW (ref 8.9–10.3)
Creatinine, Ser: 4.02 mg/dL — ABNORMAL HIGH (ref 0.44–1.00)
GFR, EST AFRICAN AMERICAN: 16 mL/min — AB (ref >60)
GFR, EST NON AFRICAN AMERICAN: 14 mL/min — AB (ref >60)
Glucose, Bld: 79 mg/dL (ref 65–99)
PHOSPHORUS: 3.1 mg/dL (ref 2.5–4.6)
POTASSIUM: 4 mmol/L (ref 3.5–5.1)
Sodium: 136 mmol/L (ref 135–145)

## 2017-10-24 LAB — CBC WITH DIFFERENTIAL/PLATELET
BASOS ABS: 0.1 10*3/uL (ref 0.0–0.1)
Basophils Relative: 1 %
EOS PCT: 2 %
Eosinophils Absolute: 0.1 10*3/uL (ref 0.0–0.7)
HEMATOCRIT: 36.1 % (ref 36.0–46.0)
HEMOGLOBIN: 11.7 g/dL — AB (ref 12.0–15.0)
LYMPHS PCT: 14 %
Lymphs Abs: 1 10*3/uL (ref 0.7–4.0)
MCH: 27.9 pg (ref 26.0–34.0)
MCHC: 32.4 g/dL (ref 30.0–36.0)
MCV: 86 fL (ref 78.0–100.0)
MONOS PCT: 9 %
Monocytes Absolute: 0.6 10*3/uL (ref 0.1–1.0)
NEUTROS PCT: 74 %
Neutro Abs: 5.3 10*3/uL (ref 1.7–7.7)
Platelets: 99 10*3/uL — ABNORMAL LOW (ref 150–400)
RBC: 4.2 MIL/uL (ref 3.87–5.11)
RDW: 23.4 % — ABNORMAL HIGH (ref 11.5–15.5)
WBC: 7.1 10*3/uL (ref 4.0–10.5)

## 2017-10-24 MED ORDER — CARVEDILOL 25 MG PO TABS
25.0000 mg | ORAL_TABLET | Freq: Once | ORAL | Status: AC
  Administered 2017-10-24: 25 mg via ORAL
  Filled 2017-10-24: qty 1

## 2017-10-24 MED ORDER — FUROSEMIDE 40 MG PO TABS
40.0000 mg | ORAL_TABLET | Freq: Two times a day (BID) | ORAL | Status: DC
  Administered 2017-10-24 – 2017-10-25 (×3): 40 mg via ORAL
  Filled 2017-10-24 (×3): qty 1

## 2017-10-24 MED ORDER — POLYETHYLENE GLYCOL 3350 17 G PO PACK
17.0000 g | PACK | Freq: Two times a day (BID) | ORAL | Status: AC
  Administered 2017-10-24 (×2): 17 g via ORAL
  Filled 2017-10-24 (×2): qty 1

## 2017-10-24 MED ORDER — CARVEDILOL 25 MG PO TABS
50.0000 mg | ORAL_TABLET | Freq: Two times a day (BID) | ORAL | Status: DC
  Administered 2017-10-24 – 2017-10-25 (×2): 50 mg via ORAL
  Filled 2017-10-24 (×2): qty 2

## 2017-10-24 MED ORDER — SENNA 8.6 MG PO TABS
2.0000 | ORAL_TABLET | Freq: Every day | ORAL | Status: DC
  Administered 2017-10-24 – 2017-10-25 (×2): 17.2 mg via ORAL
  Filled 2017-10-24 (×2): qty 2

## 2017-10-24 MED ORDER — GI COCKTAIL ~~LOC~~
30.0000 mL | Freq: Once | ORAL | Status: AC
  Administered 2017-10-24: 30 mL via ORAL
  Filled 2017-10-24: qty 30

## 2017-10-24 NOTE — Plan of Care (Signed)
  Problem: Health Behavior/Discharge Planning: Goal: Ability to manage health-related needs will improve Outcome: Progressing   Problem: Nutrition: Goal: Adequate nutrition will be maintained Outcome: Progressing   Problem: Pain Managment: Goal: General experience of comfort will improve Outcome: Progressing   Problem: Coping: Goal: Level of anxiety will decrease Outcome: Completed/Met

## 2017-10-24 NOTE — Progress Notes (Signed)
   Subjective:  No acute events overnight. Patient resting comfortably in bed. States she feels better today and was able to tolerate some PO intake last night. Denies BM during this admission.   Objective:  Vital signs in last 24 hours: Vitals:   10/23/17 0443 10/23/17 1318 10/23/17 2023 10/24/17 0553  BP: (!) 165/91 (!) 141/82 (!) 164/89 (!) 181/102  Pulse: 91 92 92 88  Resp: 20  20 15   Temp: 98.4 F (36.9 C) 98.6 F (37 C) 98.4 F (36.9 C) (!) 97.5 F (36.4 C)  TempSrc: Oral Oral Oral Oral  SpO2: 100% 97% 100% 100%  Weight: 125 lb 1.6 oz (56.7 kg)   122 lb 1.6 oz (55.4 kg)  Height:       Physical Exam  Constitutional: She is well-developed, well-nourished, and in no distress.  Cardiovascular: Normal rate and regular rhythm.  Murmur (II/VI SEM unchanged) heard. Pulmonary/Chest: Effort normal and breath sounds normal. No respiratory distress. She has no wheezes. She has no rales.  Abdominal: Soft. Bowel sounds are normal. She exhibits no distension. There is no tenderness.  Musculoskeletal: She exhibits no edema.  Neurological: She is alert.    Assessment/Plan:  Principal Problem:   Abdominal pain Active Problems:   Chronic kidney disease Stage IV-V   Aortic dissection Stanford type B s/p repair 2018 [facility])   Cocaine use   Uncontrolled hypertension   Hypertensive cardiomyopathy (HCC)   History of LUE DVT (deep vein thrombosis)  Valerie Mathis is a 32 yo female with history of chronic cholecystitis, chronic aortic dissection Stanford typeAs/p repair with graftin 2018 in [place] and type B dissection as wellsecondary to uncontrolled HTN, CKD Satge IV, and TTP who presents to the ED with 1-day history of abdominal pain associated with NBNB emesis thought to be secondary to gastroenteritis.  # Abdominal pain secondary to gastroenteritis: Abdominal pain has improved with supportive care. Denies further emesis and able to tolerate some PO intake last night and this AM.  Reports no bowel movement since admission. Will adjust bowel regimen as below.  - Zofran, GI cocktails prn - Continue Miralax daily, adding Senokot 2 tablets   - Discontinue Tramadol, avoid opiates   # Uncontrolled HTN, Hypertensive Cardiomyopathy: sBP remains 160-180s. Adjusting antihypertensive regimen as below.  - Start Lasix 40 mg BID  - Increase Coreg 25mg  BID --> 50 mg BID  - Hydral 50mg  TID and Amlodipine 10mg  - Holding clonidine, patient non-adherent at home   # History of Stanford typeAaortic dissection s/p repair with graft2018and chronic type B dissection:BP control as above  # Metabolic Acidosis # CKD Stage IV-V  Cr remains stable and euvolemic on exam. Bicarb mildly improved after addition of sodium bicarb.  - Continue sodium bicarb 650 mg BID - Continue Calcitriol 0.31mcg daily as well   # Polysubstance abuse:  - UDS positive for cocaine. Counseled on cessation; Extremely important in the setting of known chronic aortic  - Nicoderm 7 mg QD   Dispo: Anticipated discharge in approximately 1-2 day(s) pending optimization of BP.   Welford Roche, MD 10/24/2017, 7:53 AM Pager: [phone]

## 2017-10-24 NOTE — Care Management Note (Signed)
Case Management Note  Patient Details  Name: Valerie Mathis MRN: [id number] Date of Birth: 10-26-1985  Subjective/Objective:   Pt presented for Abdominal Pain. PTA from home with her sister in [place]. Plan will be to transition home with sister once stable.                  Action/Plan: Pt is without Insurance and PCP at this time. CM did make an appointment with the [facility] for hospital f/u. Appointment placed on AVS. Pt will be able to utilize the [facility] for mediations and cost will range from $4.00-$10.00, No further needs from CM at this time.   Expected Discharge Date:  10/21/17               Expected Discharge Plan:  Home/Self Care  In-House Referral:  NA  Discharge planning Services  CM Consult, Medication Assistance, Follow-up appt scheduled, [facility]  Post Acute Care Choice:  NA Choice offered to:  NA  DME Arranged:  N/A DME Agency:  NA  HH Arranged:  NA HH Agency:  NA  Status of Service:  Completed, signed off  If discussed at [place] of Stay Meetings, dates discussed:    Additional Comments:  Bethena Roys, RN 10/24/2017, 11:37 AM

## 2017-10-25 LAB — BASIC METABOLIC PANEL
ANION GAP: 9 (ref 5–15)
BUN: 30 mg/dL — AB (ref 6–20)
CALCIUM: 8.2 mg/dL — AB (ref 8.9–10.3)
CO2: 21 mmol/L — ABNORMAL LOW (ref 22–32)
Chloride: 109 mmol/L (ref 101–111)
Creatinine, Ser: 4.32 mg/dL — ABNORMAL HIGH (ref 0.44–1.00)
GFR calc Af Amer: 15 mL/min — ABNORMAL LOW (ref >60)
GFR, EST NON AFRICAN AMERICAN: 13 mL/min — AB (ref >60)
Glucose, Bld: 98 mg/dL (ref 65–99)
Potassium: 3.6 mmol/L (ref 3.5–5.1)
SODIUM: 139 mmol/L (ref 135–145)

## 2017-10-25 MED ORDER — SODIUM BICARBONATE 650 MG PO TABS: 650.0000 mg | ORAL_TABLET | Freq: Two times a day (BID) | ORAL | 0 refills | Status: DC

## 2017-10-25 MED ORDER — CARVEDILOL 25 MG PO TABS: 50.0000 mg | ORAL_TABLET | Freq: Two times a day (BID) | ORAL | 0 refills | Status: DC

## 2017-10-25 MED ORDER — POLYETHYLENE GLYCOL 3350 17 G PO PACK
17.0000 g | PACK | Freq: Two times a day (BID) | ORAL | Status: DC
  Administered 2017-10-25: 17 g via ORAL

## 2017-10-25 MED ORDER — POLYETHYLENE GLYCOL 3350 17 G PO PACK: 17.0000 g | PACK | Freq: Every day | ORAL | 1 refills | Status: DC | PRN

## 2017-10-25 MED ORDER — CALCITRIOL 0.25 MCG PO CAPS: 0.2500 ug | ORAL_CAPSULE | Freq: Every day | ORAL | 0 refills | Status: DC

## 2017-10-25 MED ORDER — AMLODIPINE BESYLATE 10 MG PO TABS: 10.0000 mg | ORAL_TABLET | Freq: Every day | ORAL | 0 refills | Status: DC

## 2017-10-25 MED ORDER — FUROSEMIDE 20 MG PO TABS: 40.0000 mg | ORAL_TABLET | Freq: Two times a day (BID) | ORAL | 0 refills | Status: DC

## 2017-10-25 MED ORDER — HYDRALAZINE HCL 50 MG PO TABS: 50.0000 mg | ORAL_TABLET | Freq: Three times a day (TID) | ORAL | 0 refills | Status: DC

## 2017-10-25 NOTE — Progress Notes (Signed)
   Subjective:  Complained of upper abdominal pain yesterday afternoon that improved with GI cocktail. Otherwise no acute events overnight. States she is feeling better this morning. Denies abdominal pain, nausea, and vomiting. Has not had a bowel movement during this admission. Toleratin PO intake.   Objective:  Vital signs in last 24 hours: Vitals:   10/24/17 1432 10/24/17 1711 10/24/17 2106 10/25/17 0411  BP: (!) 154/93 (!) 159/100 (!) 151/100 (!) 160/97  Pulse: 76  85 89  Resp:      Temp: 97.8 F (36.6 C)  99 F (37.2 C) 98.9 F (37.2 C)  TempSrc: Oral  Oral Oral  SpO2: 100%  100% 100%  Weight:    122 lb 1.6 oz (55.4 kg)  Height:       Physical Exam  Constitutional: She is oriented to person, place, and time and well-developed, well-nourished, and in no distress.  Cardiovascular: Normal rate and regular rhythm. Exam reveals no gallop and no friction rub.  Murmur (II/VI SEM unchanged ) heard. Pulmonary/Chest: Effort normal and breath sounds normal. No respiratory distress. She has no wheezes. She has no rales.  Abdominal: Soft. Bowel sounds are normal. She exhibits no distension. There is no tenderness.  Musculoskeletal: She exhibits no edema.  Neurological: She is alert and oriented to person, place, and time.    Assessment/Plan:  Principal Problem:   Abdominal pain Active Problems:   Chronic kidney disease Stage IV-V   Aortic dissection Stanford type B s/p repair 2018  [facility])   Cocaine use   Uncontrolled hypertension   Hypertensive cardiomyopathy (HCC)   History of LUE DVT (deep vein thrombosis)   Valerie Mathis is a 32 yo female with history of chronic cholecystitis, chronic aortic dissection Stanford typeAs/p repair with graftin 2018 in [place] and type B dissection as wellsecondary to uncontrolled HTN, CKD Satge IV, and TTP who presents to the ED with 1-day history of abdominal pain associated with NBNB emesis thought to be secondary to gastroenteritis.  #  Uncontrolled HTN, Hypertensive Cardiomyopathy:sBP improved, 150-160s. She will likely need further adjustments in BP medications for optimal BP control. We have therefore strongly advise her to please follow up with FM clinic. She was also counseled on importance of compliance with medications as well as smoking and cocaine cessation. Discussed importance of optimal BP control in the setting of chronic aortic dissection.  - Continue Lasix 40 mg BID  - Continue 50 mg BID  - Hydral 50mg  TID and Amlodipine 10mg   # Abdominal pain secondary to gastroenteritis:Abdominal pain resolved. No further N/V. No BM after enema yesterday. Declined enema today. Abdominal imaging on admission without acute intra-abdominal process. Advise patient to continue taking Miralax at home. Would expect bowels to start moving now that PO intake has improved.  - Zofran  - Continue Miralax daily + Senokot 2  # History of Stanford typeAaortic dissection s/p repair with graft2018and chronic type B dissection:BP control as above  # Metabolic Acidosis # CKD Stage IV-V  Cr remains stable and euvolemic on exam. Bicarb continues to improve.  - Continue sodium bicarb 650 mg BID and Calcitriol 0.20mcg daily as well   # Polysubstance abuse: UDS positive for cocaine. Counseled on cessation;Extremely important in the setting of known chronicaortic dissection.  - Nicoderm 7 mg QD   Dispo: Anticipated discharge today.   Welford Roche, MD 10/25/2017, 9:15 AM Pager: [phone]

## 2017-10-25 NOTE — Discharge Summary (Signed)
Name: Valerie Mathis MRN: [id number] DOB: Jan 30, 1986 32 y.o. PCP: Patient, No Pcp Per  Date of Admission: 10/20/2017  2:17 AM Date of Discharge: 10/25/2017 Attending Physician: Lucious Groves, DO  Discharge Diagnosis: 1. Uncontrolled hypertension  2. Abdominal pain  3. Chronic aortic dissection  4. CKD Stage   5. Polysubstance abuse 6. Hypertensive cardiomyopathy   Discharge Medications: Allergies as of 10/25/2017   No Known Allergies     Medication List    STOP taking these medications   cloNIDine 0.3 MG tablet Commonly known as:  CATAPRES     TAKE these medications   amLODipine 10 MG tablet Commonly known as:  NORVASC Take 1 tablet (10 mg total) by mouth daily. for high blood pressure   calcitRIOL 0.25 MCG capsule Commonly known as:  ROCALTROL Take 1 capsule (0.25 mcg total) by mouth daily.   carvedilol 25 MG tablet Commonly known as:  COREG Take 2 tablets (50 mg total) by mouth 2 (two) times daily with a meal. What changed:    how much to take  when to take this  additional instructions   ferrous sulfate 325 (65 FE) MG tablet Take 1 tablet (325 mg total) by mouth 2 (two) times daily with a meal.   furosemide 20 MG tablet Commonly known as:  LASIX Take 2 tablets (40 mg total) by mouth 2 (two) times daily. For high blood pressure/fluid What changed:    how much to take  when to take this   hydrALAZINE 50 MG tablet Commonly known as:  APRESOLINE Take 1 tablet (50 mg total) by mouth 3 (three) times daily. What changed:    medication strength  how much to take  additional instructions   polyethylene glycol packet Commonly known as:  MIRALAX Take 17 g by mouth daily as needed for moderate constipation or severe constipation.   sodium bicarbonate 650 MG tablet Take 1 tablet (650 mg total) by mouth 2 (two) times daily.       Disposition and follow-up:   [name] was discharged from [facility] in Stable condition.  At  the hospital follow up visit please address:  1.  Please assess for ongoing abdominal pain, nausea, and vomiting. Please ensure compliance with BP medications including hydralazine, Coreg, amlodipine, and Lasix. Please ensure patient has follow up with nephrology given advanced CKD. Please continue to counsel patient on cocaine and smoking cessation.   2.  Labs / imaging needed at time of follow-up: BMP to check potassium, bicarb, and renal function   3.  Pending labs/ test needing follow-up: None   Follow-up Appointments: Follow-up Information    [facility] Follow up on 11/16/2017.   Why:  11:10 am for Hospital Follow up with Domenica Fail.  Contact information: [place] [id number] [place]. Go to.   Why:  this location to utilize the pharmacy. Medications will range in cost from $4.00-$10.00.  Contact information: [place] [id number] [place] Hospital Course by problem list:  1. Uncontrolled hypertension: Patient presented complaining of acute epigastric pain (more details below) and was found to have a BP of 228/144 without evidence of end-organ damage. She has a history of uncontrolled HTN and is non-adherent with home anti-hypertensive due to financial issues. Home medications prior to admission included Coreg, hydralazine, amlodipine,  Lasix, and clonidine. She was initially placed on a Diltiazem drip and then transitioned to oral medications. She was discharged on Coreg, Hydralazine, amlodipine, and Lasix at the doses listed above. Clonidine was not resumed due to history of medication non-adherence and concern for rebound hypertension. She was counseled on importance of optimal BP control as well as cocaine and smoking cessation, and was asked to follow up closely with a primary care physician as she will  likely need further up titration of anti-hypertensive medications.   2. Abdominal pain: Patient presented with acute onset of epigastric abdominal pain associated with nausea and NBNB emesis. She has a history of chronic aortic dissection (described below) and MRA of chest/abd/pelvis was ordered in the ED due to concern for progression of aortic dissection in the setting of uncontrolled hypertension and acute onset of abdominal pain. MRA showed stable aortic dissection and nonspecific inflammation of duodenum and proximal jejunum. RUQ [place] also ordered due to elevation on total bilirubin that showed cholelithiasis without evidence of cholecystitis. Her abdominal pain was thought to be secondary to viral gastroenteritis and she was managed conservatively (IVF, pain control, and bowel rest) with resolution of symptoms.   3. Chronic aortic dissection: Patient has a history of Stanford typeAaortic dissection s/p repair with graft2018and chronic type B dissection. MRA chest/abdomen/pelvis ordered on admission with stable appearance of aortic dissection (see report below). Blood pressure management as above. Counseled on cocaine and smoking cessation.   4. CKD Stage IV: Patient has history of CKD of unknown etiology (uncontrolled HTN vs FSGS) and does not follow up with nephrology due to lack of health insurance. Her renal function remained at baseline during this admission. She did develop a metabolic acidosis and was started on sodium bicarbonate 650 mg BID.  Advised patient to establish with nephrology given history of advanced CKD.    5. Polysubstance abuse: Patient reported active cocaine and tobacco abuse. UDS positive for cocaine. She was counseled on extreme importance of cocaine and smoking cessation given her history of aortic dissection and uncontrolled hypertension.    6. Hypertensive cardiomyopathy: Noted on recent TTE on 09/12/2017 (previous admission). She remained euvolemic during this  admission. Blood pressure management as above.    Discharge Vitals:   BP 131/67   Pulse 78   Temp 97.7 F (36.5 C) (Oral)   Resp 15   Ht 5\' 3"  (1.6 m)   Wt 122 lb 1.6 oz (55.4 kg)   SpO2 100%   BMI 21.63 kg/m   Pertinent Labs, Studies, and Procedures:   CBC Latest Ref Rng & Units 10/24/2017 10/23/2017 10/20/2017  WBC 4.0 - 10.5 K/uL 7.1 6.3 -  Hemoglobin 12.0 - 15.0 g/dL 11.7(L) 13.4 16.3(H)  Hematocrit 36.0 - 46.0 % 36.1 41.5 48.0(H)  Platelets 150 - 400 K/uL 99(L) 88(L) -   BMP Latest Ref Rng & Units 10/25/2017 10/24/2017 10/23/2017  Glucose 65 - 99 mg/dL 98 79 95  BUN 6 - 20 mg/dL 30(H) 34(H) 39(H)  Creatinine 0.44 - 1.00 mg/dL 4.32(H) 4.02(H) 4.03(H)  Sodium 135 - 145 mmol/L 139 136 135  Potassium 3.5 - 5.1 mmol/L 3.6 4.0 4.6  Chloride 101 - 111 mmol/L 109 110 111  CO2 22 - 32 mmol/L 21(L) 17(L) 16(L)  Calcium 8.9 - 10.3 mg/dL 8.2(L) 8.1(L) 7.9(L)   Drugs of Abuse     Component Value Date/Time   LABOPIA NONE DETECTED 10/20/2017 2048   COCAINSCRNUR POSITIVE (A) 10/20/2017 2048   LABBENZ NONE DETECTED 10/20/2017 2048  AMPHETMU NONE DETECTED 10/20/2017 2048   THCU NONE DETECTED 10/20/2017 2048   LABBARB NONE DETECTED 10/20/2017 2048     MRA chest/abdomen/pelvis 10/20/2017: IMPRESSION: VASCULAR 1. Stable appearance of ascending thoracic aortic repair without evidence of complication. 2. 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 The maximal transverse diameter is unchanged at 4.2 cm. 3. No evidence of acute abdominal aortic dissection, aneurysm or other arterial abnormality within the limitations of this noncontrast enhanced study. 4. Concentric hypertrophy of the left ventricular myocardium consistent with clinical history of severe systemic hypertension.  NON VASCULAR 1. New onset submucosal edema, bowel wall thickening and mesenteric edema involving the duodenum  and proximal jejunum. Differential considerations include both infectious and inflammatory enteritis. 2. Slight interval increase in perihepatic and perisplenic ascites is likely reactive and related to the infectious/inflammatory enteritis described above. 3. Stable small right pleural effusion. 4. Normal MRI appearance of the pancreas.  Abd XR: FINDINGS: The bowel gas pattern is normal. No radio-opaque calculi or other significant radiographic abnormality are seen.  Limited RUQ [place]: FINDINGS: Gallbladder: Gallstones are noted. No wall thickening visualized. No sonographic Murphy sign noted by sonographer. Common bile duct: Diameter: 4.8 mm  Liver: No focal lesion identified. Within normal limits in parenchymal echogenicity. Portal vein is patent on color Doppler imaging with normal direction of blood flow towards the liver. Minimal ascites is identified. There is marked increased echotexture of the right kidney.  IMPRESSION: Cholelithiasis without sonographic evidence of acute cholecystitis. Minimal ascites. Marked increased echotexture of right kidney.   Discharge Instructions: Discharge Instructions    Call MD for:  persistant nausea and vomiting   Complete by:  As directed    Call MD for:  severe uncontrolled pain   Complete by:  As directed    Diet - low sodium heart healthy   Complete by:  As directed    Discharge instructions   Complete by:  As directed    Ms. Flanagin,   You were admitted to the hospital due to severely uncontrolled high blood pressure.  He will be very important that you continue to take the medications that are listed below.   1- carvedilol 50 mg twice daily 2- amlodipine 10 mg daily 3- hydralazine 50 mg 3 times daily 4- Lasix 40 mg twice daily  You can purchase these medications at [facility] at a lower cost. Please let your doctor if you are unable to afford these medications. Uncontrolled blood pressure can worsen your kidney function and the  tear in your aorta.   You have a hospital follow-up appointment at [facility] on 7/3.  It is very important to follow-up with them as you will likely need changes in your blood pressure medicine doses. They can also continue to refill your medications.   For your kidneys, we started a medication called sodium bicarbonate.  You will need to take 1 tablet 2 times a day.  This is to prevent your kidney function to worsen.   We recommend you stop smoking as well as cessation from cocaine use. These can severely increased your blood pressure and lead to further damage to your kidneys and aorta.   For your abdominal pain, continue to take Miralax every day. This will help you with constipation.   Please call us if you have any questions.   - Dr. Frederico Hamman   Increase activity slowly   Complete by:  As directed  SignedWelford Roche, MD 10/26/2017, 5:19 PM   Pager: [phone]

## 2017-11-16 ENCOUNTER — Ambulatory Visit (INDEPENDENT_AMBULATORY_CARE_PROVIDER_SITE_OTHER): Payer: Self-pay | Admitting: Physician Assistant

## 2017-11-29 ENCOUNTER — Encounter (HOSPITAL_COMMUNITY): Payer: Self-pay | Admitting: *Deleted

## 2017-11-29 ENCOUNTER — Inpatient Hospital Stay (HOSPITAL_COMMUNITY)
Admission: EM | Admit: 2017-11-29 | Discharge: 2017-12-03 | DRG: 304 | Disposition: A | Discharge status: Home / Self Care | Payer: Medicaid Other | Attending: Internal Medicine | Admitting: Internal Medicine

## 2017-11-29 ENCOUNTER — Other Ambulatory Visit: Payer: Self-pay

## 2017-11-29 ENCOUNTER — Emergency Department (HOSPITAL_COMMUNITY): Payer: Medicaid Other

## 2017-11-29 DIAGNOSIS — M549 Dorsalgia, unspecified: Secondary | ICD-10-CM | POA: Diagnosis present

## 2017-11-29 DIAGNOSIS — Z9889 Other specified postprocedural states: Secondary | ICD-10-CM

## 2017-11-29 DIAGNOSIS — F17210 Nicotine dependence, cigarettes, uncomplicated: Secondary | ICD-10-CM | POA: Diagnosis present

## 2017-11-29 DIAGNOSIS — G8929 Other chronic pain: Secondary | ICD-10-CM | POA: Diagnosis present

## 2017-11-29 DIAGNOSIS — Z9114 Patient's other noncompliance with medication regimen: Secondary | ICD-10-CM

## 2017-11-29 DIAGNOSIS — M311 Thrombotic microangiopathy: Secondary | ICD-10-CM | POA: Diagnosis present

## 2017-11-29 DIAGNOSIS — I5033 Acute on chronic diastolic (congestive) heart failure: Secondary | ICD-10-CM | POA: Diagnosis present

## 2017-11-29 DIAGNOSIS — F1410 Cocaine abuse, uncomplicated: Secondary | ICD-10-CM | POA: Diagnosis present

## 2017-11-29 DIAGNOSIS — I43 Cardiomyopathy in diseases classified elsewhere: Secondary | ICD-10-CM | POA: Diagnosis present

## 2017-11-29 DIAGNOSIS — Z8249 Family history of ischemic heart disease and other diseases of the circulatory system: Secondary | ICD-10-CM

## 2017-11-29 DIAGNOSIS — Z79899 Other long term (current) drug therapy: Secondary | ICD-10-CM

## 2017-11-29 DIAGNOSIS — I161 Hypertensive emergency: Principal | ICD-10-CM | POA: Diagnosis present

## 2017-11-29 DIAGNOSIS — R319 Hematuria, unspecified: Secondary | ICD-10-CM | POA: Diagnosis present

## 2017-11-29 DIAGNOSIS — N2581 Secondary hyperparathyroidism of renal origin: Secondary | ICD-10-CM | POA: Diagnosis present

## 2017-11-29 DIAGNOSIS — I248 Other forms of acute ischemic heart disease: Secondary | ICD-10-CM | POA: Diagnosis present

## 2017-11-29 DIAGNOSIS — N184 Chronic kidney disease, stage 4 (severe): Secondary | ICD-10-CM | POA: Diagnosis present

## 2017-11-29 DIAGNOSIS — I10 Essential (primary) hypertension: Secondary | ICD-10-CM | POA: Diagnosis present

## 2017-11-29 DIAGNOSIS — I130 Hypertensive heart and chronic kidney disease with heart failure and stage 1 through stage 4 chronic kidney disease, or unspecified chronic kidney disease: Secondary | ICD-10-CM | POA: Diagnosis present

## 2017-11-29 DIAGNOSIS — N189 Chronic kidney disease, unspecified: Secondary | ICD-10-CM | POA: Diagnosis present

## 2017-11-29 DIAGNOSIS — N179 Acute kidney failure, unspecified: Secondary | ICD-10-CM | POA: Diagnosis present

## 2017-11-29 DIAGNOSIS — Z86718 Personal history of other venous thrombosis and embolism: Secondary | ICD-10-CM

## 2017-11-29 DIAGNOSIS — N390 Urinary tract infection, site not specified: Secondary | ICD-10-CM | POA: Diagnosis present

## 2017-11-29 LAB — COMPREHENSIVE METABOLIC PANEL WITH GFR
ALT: 15 U/L (ref 0–44)
AST: 14 U/L — ABNORMAL LOW (ref 15–41)
Albumin: 3.2 g/dL — ABNORMAL LOW (ref 3.5–5.0)
Alkaline Phosphatase: 131 U/L — ABNORMAL HIGH (ref 38–126)
Anion gap: 9 (ref 5–15)
BUN: 50 mg/dL — ABNORMAL HIGH (ref 6–20)
CO2: 20 mmol/L — ABNORMAL LOW (ref 22–32)
Calcium: 7.8 mg/dL — ABNORMAL LOW (ref 8.9–10.3)
Chloride: 114 mmol/L — ABNORMAL HIGH (ref 98–111)
Creatinine, Ser: 6.13 mg/dL — ABNORMAL HIGH (ref 0.44–1.00)
GFR calc Af Amer: 10 mL/min — ABNORMAL LOW
GFR calc non Af Amer: 8 mL/min — ABNORMAL LOW
Glucose, Bld: 89 mg/dL (ref 70–99)
Potassium: 4.2 mmol/L (ref 3.5–5.1)
Sodium: 143 mmol/L (ref 135–145)
Total Bilirubin: 0.5 mg/dL (ref 0.3–1.2)
Total Protein: 6.2 g/dL — ABNORMAL LOW (ref 6.5–8.1)

## 2017-11-29 LAB — CBC WITH DIFFERENTIAL/PLATELET
Basophils Absolute: 0 K/uL (ref 0.0–0.1)
Basophils Relative: 1 %
Eosinophils Absolute: 0.2 K/uL (ref 0.0–0.7)
Eosinophils Relative: 6 %
HCT: 31.9 % — ABNORMAL LOW (ref 36.0–46.0)
Hemoglobin: 10.5 g/dL — ABNORMAL LOW (ref 12.0–15.0)
Lymphocytes Relative: 18 %
Lymphs Abs: 0.8 K/uL (ref 0.7–4.0)
MCH: 30.2 pg (ref 26.0–34.0)
MCHC: 32.9 g/dL (ref 30.0–36.0)
MCV: 91.7 fL (ref 78.0–100.0)
Monocytes Absolute: 0.4 K/uL (ref 0.1–1.0)
Monocytes Relative: 9 %
Neutro Abs: 2.9 K/uL (ref 1.7–7.7)
Neutrophils Relative %: 66 %
Platelets: 175 K/uL (ref 150–400)
RBC: 3.48 MIL/uL — ABNORMAL LOW (ref 3.87–5.11)
RDW: 19.6 % — ABNORMAL HIGH (ref 11.5–15.5)
WBC: 4.3 K/uL (ref 4.0–10.5)

## 2017-11-29 LAB — RAPID URINE DRUG SCREEN, HOSP PERFORMED
Amphetamines: NOT DETECTED
Benzodiazepines: NOT DETECTED
Cocaine: POSITIVE — AB
OPIATES: POSITIVE — AB
Tetrahydrocannabinol: NOT DETECTED

## 2017-11-29 LAB — TROPONIN I
TROPONIN I: 0.03 ng/mL — AB (ref <0.03)
Troponin I: 0.03 ng/mL (ref <0.03)

## 2017-11-29 LAB — CREATININE, SERUM
Creatinine, Ser: 6.15 mg/dL — ABNORMAL HIGH (ref 0.44–1.00)
GFR calc non Af Amer: 8 mL/min — ABNORMAL LOW (ref >60)
GFR, EST AFRICAN AMERICAN: 10 mL/min — AB (ref >60)

## 2017-11-29 LAB — URINALYSIS, ROUTINE W REFLEX MICROSCOPIC
Bilirubin Urine: NEGATIVE
Glucose, UA: NEGATIVE mg/dL
Ketones, ur: NEGATIVE mg/dL
Nitrite: NEGATIVE
Protein, ur: >=300 mg/dL — AB
Specific Gravity, Urine: 1.014 (ref 1.005–1.030)
pH: 5 (ref 5.0–8.0)

## 2017-11-29 LAB — SODIUM, URINE, RANDOM: Sodium, Ur: 50 mmol/L

## 2017-11-29 LAB — PROTIME-INR
INR: 1.19
PROTHROMBIN TIME: 15 s (ref 11.4–15.2)

## 2017-11-29 LAB — LIPASE, BLOOD: LIPASE: 25 U/L (ref 11–51)

## 2017-11-29 LAB — I-STAT CG4 LACTIC ACID, ED: LACTIC ACID, VENOUS: 0.7 mmol/L (ref 0.5–1.9)

## 2017-11-29 MED ORDER — NICARDIPINE HCL IN NACL 20-0.86 MG/200ML-% IV SOLN
3.0000 mg/h | INTRAVENOUS | Status: DC
  Administered 2017-11-29: 3 mg/h via INTRAVENOUS
  Administered 2017-11-30 (×2): 5 mg/h via INTRAVENOUS
  Filled 2017-11-29 (×4): qty 200

## 2017-11-29 MED ORDER — SODIUM CHLORIDE 0.9 % IV SOLN
1.0000 g | INTRAVENOUS | Status: DC
  Administered 2017-11-29: 1 g via INTRAVENOUS
  Filled 2017-11-29 (×2): qty 10

## 2017-11-29 MED ORDER — ACETAMINOPHEN 325 MG PO TABS
650.0000 mg | ORAL_TABLET | ORAL | Status: DC | PRN
Start: 2017-11-29 — End: 2017-12-03
  Administered 2017-11-29 – 2017-12-02 (×4): 650 mg via ORAL
  Filled 2017-11-29 (×5): qty 2

## 2017-11-29 MED ORDER — LABETALOL HCL 5 MG/ML IV SOLN: 10.0000 mg | Freq: Once | INTRAVENOUS | Status: DC

## 2017-11-29 MED ORDER — LABETALOL HCL 5 MG/ML IV SOLN
20.0000 mg | Freq: Once | INTRAVENOUS | Status: AC
  Administered 2017-11-29: 20 mg via INTRAVENOUS
  Filled 2017-11-29: qty 4

## 2017-11-29 MED ORDER — HYDRALAZINE HCL 50 MG PO TABS
50.0000 mg | ORAL_TABLET | Freq: Three times a day (TID) | ORAL | Status: DC
  Administered 2017-11-29 – 2017-11-30 (×2): 50 mg via ORAL
  Filled 2017-11-29 (×2): qty 1

## 2017-11-29 MED ORDER — SODIUM BICARBONATE 650 MG PO TABS
650.0000 mg | ORAL_TABLET | Freq: Two times a day (BID) | ORAL | Status: DC
  Administered 2017-11-29 – 2017-11-30 (×2): 650 mg via ORAL
  Filled 2017-11-29 (×2): qty 1

## 2017-11-29 MED ORDER — CARVEDILOL 25 MG PO TABS
25.0000 mg | ORAL_TABLET | Freq: Two times a day (BID) | ORAL | Status: DC
  Administered 2017-11-30 – 2017-12-03 (×6): 25 mg via ORAL
  Filled 2017-11-29: qty 1
  Filled 2017-11-29 (×3): qty 2
  Filled 2017-11-29: qty 1
  Filled 2017-11-29: qty 2
  Filled 2017-11-29: qty 1

## 2017-11-29 MED ORDER — HEPARIN SODIUM (PORCINE) 5000 UNIT/ML IJ SOLN
5000.0000 [IU] | Freq: Three times a day (TID) | INTRAMUSCULAR | Status: DC
  Administered 2017-11-29 – 2017-12-03 (×11): 5000 [IU] via SUBCUTANEOUS
  Filled 2017-11-29 (×11): qty 1

## 2017-11-29 MED ORDER — SODIUM CHLORIDE 0.9 % IV SOLN
250.0000 mL | INTRAVENOUS | Status: DC | PRN
  Administered 2017-12-01: 250 mL via INTRAVENOUS

## 2017-11-29 MED ORDER — SODIUM CHLORIDE 0.9 % IV SOLN
INTRAVENOUS | Status: DC
  Administered 2017-11-29: 16:00:00 via INTRAVENOUS

## 2017-11-29 MED ORDER — NICARDIPINE HCL IN NACL 20-0.86 MG/200ML-% IV SOLN
3.0000 mg/h | Freq: Once | INTRAVENOUS | Status: AC
  Administered 2017-11-29: 5 mg/h via INTRAVENOUS
  Filled 2017-11-29: qty 200

## 2017-11-29 MED ORDER — AMLODIPINE BESYLATE 10 MG PO TABS
10.0000 mg | ORAL_TABLET | Freq: Every day | ORAL | Status: DC
  Administered 2017-11-30 – 2017-12-03 (×4): 10 mg via ORAL
  Filled 2017-11-29 (×4): qty 1

## 2017-11-29 MED ORDER — SODIUM CHLORIDE 0.9 % IV SOLN
INTRAVENOUS | Status: DC
  Administered 2017-11-29: 50 mL/h via INTRAVENOUS

## 2017-11-29 MED ORDER — MORPHINE SULFATE (PF) 4 MG/ML IV SOLN
4.0000 mg | Freq: Once | INTRAVENOUS | Status: AC
  Administered 2017-11-29: 4 mg via INTRAVENOUS
  Filled 2017-11-29: qty 1

## 2017-11-29 NOTE — ED Notes (Signed)
Pt also complains of chest tightness

## 2017-11-29 NOTE — ED Triage Notes (Signed)
Pt complains of left sided facial, throat, arm, leg swelling since last night. Pt was unable to swallow pills since this morning, was unable to take BP medication. Pt hypertensive in triage.   Pt has hx of aortic dissection in 2018.

## 2017-11-29 NOTE — Consult Note (Addendum)
.. ..  Name: Valerie Mathis MRN: [id number] DOB: November 26, 1985    ADMISSION DATE:  11/29/2017 CONSULTATION DATE:  11/29/2017  REFERRING MD :  Jani Gravel MD  CHIEF COMPLAINT:  Generalized swelling  BRIEF PATIENT DESCRIPTION: 32 yr old female with PMHx of Type A Aortic dissection s/p graft repair, HF( combined systolic and diastolic dysfunction), CKD stage 4, TTP and HTN presenting with sore throat , non compliance with antihypertensive medications and diffuse swelling involving ( face, arms and legs). PCCM consulted when pt was started on Cardene ggt.  SIGNIFICANT EVENTS   - hypertensive emergency  STUDIES:  MRA chest Ab/pelvis VASCULAR Stable appearance of residual chronic dissection involving the distal aortic arch and descending thoracic aorta. No significant increased caliber of the descending aorta which measures 4.3 cm in maximum diameter. No evidence of acute hemorrhage. Dissection again terminates at the level of the mid descending thoracic aorta and does not extend into the abdomen.  NON-VASCULAR Some interval enlargement in size of a right pleural effusion. There is a small left pleural effusion. Small amount of ascites in the peritoneal cavity shows similar appearance to the prior study.    HISTORY OF PRESENT ILLNESS:    32 yr old female with PMHx of Type A Aortic dissection s/p graft repair, HF( combined systolic and diastolic dysfunction), CKD stage 4, TTP and HTN presenting with sore throat, non compliance with antihypertensive medications and diffuse swelling involving ( face, arms and legs).   On my evaluation pt is alert awake oriented with no complaints states that she feels better in comparison to when she first presented. She commented that the swelling ine her legs arm and face is new.  She states that she lost her medicaid a while ago and since then she has not followed up with a cardiologist. PCCM consulted when pt was started on Cardene ggt.  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  has a past surgical history that includes Repair of acute ascending thoracic aortic dissection and Ectopic pregnancy surgery (2018). Prior to Admission medications   Medication Sig Start Date End Date Taking? Authorizing Provider  amLODipine (NORVASC) 10 MG tablet Take 1 tablet (10 mg total) by mouth daily. for high blood pressure 10/25/17  Yes Santos-Sanchez, Merlene Morse, MD  carvedilol (COREG) 25 MG tablet Take 2 tablets (50 mg total) by mouth 2 (two) times daily with a meal. 10/25/17  Yes Santos-Sanchez, Merlene Morse, MD  furosemide (LASIX) 20 MG tablet Take 2 tablets (40 mg total) by mouth 2 (two) times daily. For high blood pressure/fluid 10/25/17  Yes Santos-Sanchez, Merlene Morse, MD  hydrALAZINE (APRESOLINE) 50 MG tablet Take 1 tablet (50 mg total) by mouth 3 (three) times daily. 10/25/17  Yes Santos-Sanchez, Merlene Morse, MD  sodium bicarbonate 650 MG tablet Take 1 tablet (650 mg total) by mouth 2 (two) times daily. 10/25/17  Yes Santos-Sanchez, Merlene Morse, MD  calcitRIOL (ROCALTROL) 0.25 MCG capsule Take 1 capsule (0.25 mcg total) by mouth daily. Patient not taking: Reported on 11/29/2017 10/25/17   Welford Roche, MD  ferrous sulfate 325 (65 FE) MG tablet Take 1 tablet (325 mg total) by mouth 2 (two) times daily with a meal. Patient not taking: Reported on 11/29/2017 09/15/17   Damita Lack, MD  polyethylene glycol (MIRALAX) packet Take 17 g by mouth daily  as needed for moderate constipation or severe constipation. Patient not taking: Reported on 11/29/2017 10/25/17   Welford Roche, MD   No Known Allergies  FAMILY HISTORY:  family history includes Hypertension in her mother. SOCIAL HISTORY:  reports that she has been smoking  cigarettes.  She has a 2.00 pack-year smoking history. She has never used smokeless tobacco. She reports that she drinks alcohol. She reports that she has current or past drug history. Drugs: Cocaine and Benzodiazepines.  REVIEW OF SYSTEMS:  Constitutional: Negative for fever, chills, weight loss, malaise/fatigue and diaphoresis. generalized swelling HENT: Negative for hearing loss, ear pain, nosebleeds, congestion, sore throat, neck pain, tinnitus and ear discharge.   Eyes: Negative for blurred vision, double vision, photophobia, pain, discharge and redness.  Respiratory: Negative for cough, hemoptysis, sputum production, shortness of breath, wheezing and stridor.   Cardiovascular: Negative for chest pain, palpitations, orthopnea, claudication, leg swelling and PND.  Gastrointestinal: Negative for heartburn, nausea, vomiting, abdominal pain, diarrhea, constipation, blood in stool and melena.  Genitourinary: Negative for dysuria, urgency, frequency, hematuria and flank pain.  Musculoskeletal: Negative for myalgias, back pain, joint pain and falls.  Skin: Negative for itching and rash.  Neurological: Negative for dizziness, tingling, tremors, sensory change, speech change, focal weakness, seizures, loss of consciousness, weakness and headaches.  Endo/Heme/Allergies: Negative for environmental allergies and polydipsia. Does not bruise/bleed easily.  SUBJECTIVE:   VITAL SIGNS: Temp:  [97.7 F (36.5 C)] 97.7 F (36.5 C) (07/16 2113) Pulse Rate:  [68-81] 69 (07/16 2113) Resp:  [14-23] 16 (07/16 2113) BP: (139-250)/(68-155) 150/95 (07/16 2113) SpO2:  [89 %-100 %] 96 % (07/16 2113) Weight:  [64.4 kg (141 lb 14.4 oz)] 64.4 kg (141 lb 14.4 oz) (07/16 1349)  PHYSICAL EXAMINATION: General:  Alert not in any distress Neuro:  No focal deficits, follows commands, oriented, speech clear HEENT:  NCAT, some periorbital swelling Cardiovascular:  S1 and S2 3/6 systolic murmur no rub  Lungs:  Decreased  at bases no wheezing no rhonci no crackles Abdomen:  Soft non tender + BS  Musculoskeletal:  +2 edema up to mid shin and + 1 extending above knees Skin:  Grossly intact  Recent Labs  Lab 11/29/17 1514  NA 143  K 4.2  CL 114*  CO2 20*  BUN 50*  CREATININE 6.13*  GLUCOSE 89   Recent Labs  Lab 11/29/17 1514  HGB 10.5*  HCT 31.9*  WBC 4.3  PLT 175   Mr Mra Chest Wo Contrast  Result Date: 11/29/2017 CLINICAL DATA:  History prior thoracic aortic dissection with repair of ascending thoracic aortic dissection and 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 Now with chest pain, hypertension and edema involving the left side of the body. Iodinated contrast and gadolinium cannot be administered due to significant renal failure. EXAM: MRA CHEST WITH OR WITHOUT CONTRAST; MRA ABDOMEN WITH OR WITHOUT CONTRAST TECHNIQUE: Angiographic images of the chest were obtained using MRA technique without intravenous contrast. CONTRAST:  None COMPARISON:  10/20/2017 FINDINGS: VASCULAR Aorta: Stable appearance of the thoracic aorta with prior repair of the ascending thoracic aorta. Residual chronic dissection which begins in the distal arch has a stable appearance. Maximum of the distal arch and proximal descending thoracic aorta is 4.3 cm and is stable. Intimal flap extends again to the level of the mid descending thoracic aorta. No evidence of significant enlargement of the false lumen or acute hemorrhage. Heart: Stable cardiac enlargement and evidence of left ventricular hypertrophy. Trace pericardial fluid. Pulmonary Arteries:  Normal caliber central pulmonary arteries. Other: The abdominal aorta and its branches remain unremarkable without evidence of aneurysm or dissection. NON-VASCULAR There is some interval enlargement in size of a right pleural effusion which remains still of fairly small volume. There is a very small left pleural effusion. Small amount of ascites in the peritoneal  cavity shows similar appearance to the prior study. IMPRESSION: VASCULAR Stable appearance of residual chronic dissection involving the distal aortic arch and descending thoracic aorta. No significant increased caliber of the descending aorta which measures 4.3 cm in maximum diameter. No evidence of acute hemorrhage. Dissection again terminates at the level of the mid descending thoracic aorta and does not extend into the abdomen. NON-VASCULAR Some interval enlargement in size of a right pleural effusion. There is a small left pleural effusion. Small amount of ascites in the peritoneal cavity shows similar appearance to the prior study. Electronically Signed   By: Aletta Edouard M.D.   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 Now with chest pain, hypertension and edema involving the left side of the body. Iodinated contrast and gadolinium cannot be administered due to significant renal failure. EXAM: MRA CHEST WITH OR WITHOUT CONTRAST; MRA ABDOMEN WITH OR WITHOUT CONTRAST TECHNIQUE: Angiographic images of the chest were obtained using MRA technique without intravenous contrast. CONTRAST:  None COMPARISON:  10/20/2017 FINDINGS: VASCULAR Aorta: Stable appearance of the thoracic aorta with prior repair of the ascending thoracic aorta. Residual chronic dissection which begins in the distal arch has a stable appearance. Maximum of the distal arch and proximal descending thoracic aorta is 4.3 cm and is stable. Intimal flap extends again to the level of the mid descending thoracic aorta. No evidence of significant enlargement of the false lumen or acute hemorrhage. Heart: Stable cardiac enlargement and evidence of left ventricular hypertrophy. Trace pericardial fluid. Pulmonary Arteries:  Normal caliber  central pulmonary arteries. Other: The abdominal aorta and its branches remain unremarkable without evidence of aneurysm or dissection. NON-VASCULAR There is some interval enlargement in size of a right pleural effusion which remains still of fairly small volume. There is a very small left pleural effusion. Small amount of ascites in the peritoneal cavity shows similar appearance to the prior study. IMPRESSION: VASCULAR Stable appearance of residual chronic dissection involving the distal aortic arch and descending thoracic aorta. No significant increased caliber of the descending aorta which measures 4.3 cm in maximum diameter. No evidence of acute hemorrhage. Dissection again terminates at the level of the mid descending thoracic aorta and does not extend into the abdomen. NON-VASCULAR Some interval enlargement in size of a right pleural effusion. There is a small left pleural effusion. Small amount of ascites in the peritoneal cavity shows similar appearance to the prior study. Electronically Signed   By: Aletta Edouard M.D.   On: 11/29/2017 17:54    ASSESSMENT / PLAN: Acute Issues: 1. HTN Emergency 2. HF( possibly combined systolic and diastolic dysfunction) 3. Bilateral pleural effusion ( right >left) 4. Acute on Chronic Kidney Injury  Plan: 1. Continue Cardene titrate to keep SBP <157mmHg. Also continue Carvedilol. Last ECHO was in April 2019 and showed Severe hypertensive cardiomyopathy/LVH. Mildly depressed LVEF,possibly due to severe afterload increase. 2. Fluid overloaded secondary to HFrEF and ARF: Bilateral effusions and generalized swelling 3. Recommend Nephrology consult for ARF:  suspicious for nephropathy/ nephrotic syndrome given  The Lower and upper extremity edema w/facial edema  And UA that showed proteinuria. Would avoid nephrotoxic medications. 4. ?Moderate pulmonary  Hypertension( ?Type II), likely secondary to left sided heart failure. Per ECHO in April [place] pressures 55   I,  Dr Seward Carol have personally reviewed patient's available data, including medical history, events of note, physical examination and test results as part of my evaluation. I have discussed with other care providers such asRN and Elink. The patient is critically ill with multiple organ systems failure and requires high complexity decision making for assessment and support, frequent evaluation and titration of therapies, application of advanced monitoring technologies and extensive interpretation of multiple databases. Critical Care Time devoted to patient care services described in this note is 48 Minutes. This time reflects time of care of this signee Dr Seward Carol. This critical care time does not reflect procedure time, or teaching time or supervisory time but could involve care discussion time    DISPOSITION:ICU under [place] [facility] TIME: 48 mins PROGNOSIS:Guarded CODE STATUS: Full   Signed Dr Seward Carol Pulmonary Critical Care Locums Pulmonary and [place] Pager: [phone]  11/29/2017, 9:37 PM

## 2017-11-29 NOTE — ED Notes (Signed)
Patient transported to MRI 

## 2017-11-29 NOTE — ED Provider Notes (Signed)
[facility] DEPT Provider Note   CSN: [id number] Arrival date & time: 11/29/17  1320     History   Chief Complaint Chief Complaint  Patient presents with  . Facial Swelling  . Leg Swelling  . Arm Swelling    HPI Valerie Mathis is a 32 y.o. female.  HPI 32 year old female with past medical history as below including history of aortic dissection, CKD, hypertensive cardiomyopathy, here with left-sided swelling and chest pressure.  Patient states she awoke today and noted swelling in her face, left arm, and left leg.  She also felt short of breath and occasionally dizzy.  She has history of somewhat similar symptoms during her aortic dissection, which was treated at a [place] hospital in 2018.  She has history of very poorly controlled hypertension, as well as cocaine abuse.  She currently feels short of breath.  She feels very lightheaded, but denies any focal numbness or weakness.  She is had a mild, aching, dull chest pressure.  She does admit that she did not take her blood pressure medications today.  Denies any fevers or chills.  No cough or sputum production.  Symptoms seem worse with exertion.  No alleviating factors. Past Medical History:  Diagnosis Date  . Aortic dissection ([place])   . CHF (congestive heart failure) ([place])   . CKD (chronic kidney disease) stage 4, GFR 15-29 ml/min (HCC)   . H/O: alcohol abuse   . Hypertension   . Renal disorder   . 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 Hypertensive urgency 11/29/2017  . ARF (acute renal failure) ([place]) 11/29/2017  . History of LUE DVT (deep vein thrombosis) 10/21/2017  . Abdominal pain 10/20/2017  . Uncontrolled hypertension 10/20/2017  . Hypertensive cardiomyopathy ([place]) 10/20/2017  . Aortic dissection Stanford type B s/p repair 2018 ([place])   . Hypertensive heart disease without heart failure   . Cocaine use   . Hypertensive  emergency 09/12/2017  . Back pain 09/12/2017  . Tachycardia 09/12/2017  . Chronic kidney disease Stage IV-V   . 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 ECTOPIC PREGNANCY SURGERY  2018  . REPAIR OF ACUTE ASCENDING THORACIC AORTIC DISSECTION       OB History   None      Home Medications    Prior to Admission medications   Medication Sig Start Date End Date Taking? Authorizing Provider  amLODipine (NORVASC) 10 MG tablet Take 1 tablet (10 mg total) by mouth daily. for high blood pressure 10/25/17  Yes Santos-Sanchez, Merlene Morse, MD  carvedilol (COREG) 25 MG tablet Take 2 tablets (50 mg total) by mouth 2 (two) times daily with a meal. 10/25/17  Yes Santos-Sanchez, Merlene Morse, MD  furosemide (LASIX) 20 MG tablet Take 2 tablets (40 mg total) by mouth 2 (two) times daily. For high blood pressure/fluid 10/25/17  Yes Santos-Sanchez, Merlene Morse, MD  hydrALAZINE (APRESOLINE) 50 MG tablet Take 1 tablet (50 mg total) by mouth 3 (three) times daily. 10/25/17  Yes Santos-Sanchez, Merlene Morse, MD  sodium bicarbonate 650 MG tablet Take 1 tablet (650 mg total) by mouth 2 (two) times daily. 10/25/17  Yes Santos-Sanchez, Merlene Morse, MD  calcitRIOL (ROCALTROL) 0.25 MCG capsule Take 1 capsule (0.25 mcg total) by mouth daily. Patient not taking: Reported on 11/29/2017 10/25/17   Welford Roche, MD  ferrous sulfate 325 (65 FE) MG tablet Take 1 tablet (325  mg total) by mouth 2 (two) times daily with a meal. Patient not taking: Reported on 11/29/2017 09/15/17   Damita Lack, MD  polyethylene glycol [facility]) packet Take 17 g by mouth daily as needed for moderate constipation or severe constipation. Patient not taking: Reported on 11/29/2017 10/25/17   Welford Roche, MD    Family History Family History  Problem Relation Age of Onset  . Hypertension Mother     Social History Social History   Tobacco Use  . Smoking status: Current Every Day Smoker     Packs/day: 0.50    Years: 4.00    Pack years: 2.00    Types: Cigarettes  . Smokeless tobacco: Never Used  Substance Use Topics  . Alcohol use: Yes    Comment: OCCASIONAL  . Drug use: Yes    Types: Cocaine, Benzodiazepines    Comment: Last use 08/2017     Allergies   Patient has no known allergies.   Review of Systems Review of Systems  Constitutional: Positive for fatigue.  Respiratory: Positive for chest tightness and shortness of breath.   Cardiovascular: Positive for chest pain.  Neurological: Positive for weakness and light-headedness.  All other systems reviewed and are negative.    Physical Exam Updated Vital Signs BP (!) 146/108   Pulse 69   Temp 97.7 F (36.5 C) (Oral)   Resp 16   Ht 5\' 3"  (1.6 m)   Wt 64.4 kg (141 lb 14.4 oz)   SpO2 96%   BMI 25.14 kg/m   Physical Exam  Constitutional: She is oriented to person, place, and time. She appears well-developed and well-nourished. She appears distressed.  HENT:  Head: Normocephalic and atraumatic.  Eyes: Conjunctivae are normal.  Neck: Neck supple.  Cardiovascular: Normal rate, regular rhythm and normal heart sounds. Exam reveals no friction rub.  No murmur heard. Slightly diminished pulse on the left upper extremity, compared to right.  Pulmonary/Chest: Effort normal and breath sounds normal. No respiratory distress. She has no wheezes. She has no rales.  Abdominal: She exhibits no distension.  Musculoskeletal: She exhibits no edema.  Neurological: She is alert and oriented to person, place, and time. She has normal strength. No cranial nerve deficit or sensory deficit. She exhibits normal muscle tone. GCS eye subscore is 4. GCS verbal subscore is 5. GCS motor subscore is 6.  Skin: Skin is warm. Capillary refill takes less than 2 seconds.  Psychiatric: She has a normal mood and affect.  Nursing note and vitals reviewed.    ED Treatments / Results  Labs (all labs ordered are listed, but only abnormal  results are displayed) Labs Reviewed  CBC WITH DIFFERENTIAL/PLATELET - Abnormal; Notable for the following components:      Result Value   RBC 3.48 (*)    Hemoglobin 10.5 (*)    HCT 31.9 (*)    RDW 19.6 (*)    All other components within normal limits  COMPREHENSIVE METABOLIC PANEL - Abnormal; Notable for the following components:   Chloride 114 (*)    CO2 20 (*)    BUN 50 (*)    Creatinine, Ser 6.13 (*)    Calcium 7.8 (*)    Total Protein 6.2 (*)    Albumin 3.2 (*)    AST 14 (*)    Alkaline Phosphatase 131 (*)    GFR calc non Af Amer 8 (*)    GFR calc Af Amer 10 (*)    All other components within normal limits  URINALYSIS,  ROUTINE W REFLEX MICROSCOPIC - Abnormal; Notable for the following components:   APPearance CLOUDY (*)    Hgb urine dipstick SMALL (*)    Protein, ur >=300 (*)    Leukocytes, UA MODERATE (*)    Bacteria, UA RARE (*)    All other components within normal limits  TROPONIN I - Abnormal; Notable for the following components:   Troponin I 0.03 (*)    All other components within normal limits  URINE CULTURE  LIPASE, BLOOD  PROTIME-INR  RAPID URINE DRUG SCREEN, HOSP PERFORMED  CREATININE, SERUM  COMPREHENSIVE METABOLIC PANEL  TROPONIN I  TROPONIN I  TROPONIN I  CBC  LIPID PANEL  SODIUM, URINE, RANDOM  CALCIUM / CREATININE RATIO, URINE  I-STAT CG4 LACTIC ACID, ED  CYTOLOGY - NON PAP    EKG EKG Interpretation  Date/Time:  Tuesday November 29 2017 14:18:56 EDT Ventricular Rate:  70 PR Interval:    QRS Duration: 74 QT Interval:  419 QTC Calculation: 453 R Axis:   -13 Text Interpretation:  Sinus rhythm Probable left atrial enlargement Borderline T abnormalities, lateral leads Confirmed by Lacretia Leigh ([id number]) on 11/29/2017 3:37:02 PM   Radiology Mr Jodene Nam Chest Wo Contrast  Result Date: 11/29/2017 CLINICAL DATA:  History prior thoracic aortic dissection with repair of ascending thoracic aortic dissection and residual chronic type B dissection of the  descending thoracic aorta. Now with chest pain, hypertension and edema involving the left side of the body. Iodinated contrast and gadolinium cannot be administered due to significant renal failure. EXAM: MRA CHEST WITH OR WITHOUT CONTRAST; MRA ABDOMEN WITH OR WITHOUT CONTRAST TECHNIQUE: Angiographic images of the chest were obtained using MRA technique without intravenous contrast. CONTRAST:  None COMPARISON:  10/20/2017 FINDINGS: VASCULAR Aorta: Stable appearance of the thoracic aorta with prior repair of the ascending thoracic aorta. Residual chronic dissection which begins in the distal arch has a stable appearance. Maximum of the distal arch and proximal descending thoracic aorta is 4.3 cm and is stable. Intimal flap extends again to the level of the mid descending thoracic aorta. No evidence of significant enlargement of the false lumen or acute hemorrhage. Heart: Stable cardiac enlargement and evidence of left ventricular hypertrophy. Trace pericardial fluid. Pulmonary Arteries:  Normal caliber central pulmonary arteries. Other: The abdominal aorta and its branches remain unremarkable without evidence of aneurysm or dissection. NON-VASCULAR There is some interval enlargement in size of a right pleural effusion which remains still of fairly small volume. There is a very small left pleural effusion. Small amount of ascites in the peritoneal cavity shows similar appearance to the prior study. IMPRESSION: VASCULAR Stable appearance of residual chronic dissection involving the distal aortic arch and descending thoracic aorta. No significant increased caliber of the descending aorta which measures 4.3 cm in maximum diameter. No evidence of acute hemorrhage. Dissection again terminates at the level of the mid descending thoracic aorta and does not extend into the abdomen. NON-VASCULAR Some interval enlargement in size of a right pleural effusion. There is a small left pleural effusion. Small amount of ascites in the  peritoneal cavity shows similar appearance to the prior study. Electronically Signed   By: Aletta Edouard M.D.   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 Now with chest pain, hypertension and edema involving the left side of the  body. Iodinated contrast and gadolinium cannot be administered due to significant renal failure. EXAM: MRA CHEST WITH OR WITHOUT CONTRAST; MRA ABDOMEN WITH OR WITHOUT CONTRAST TECHNIQUE: Angiographic images of the chest were obtained using MRA technique without intravenous contrast. CONTRAST:  None COMPARISON:  10/20/2017 FINDINGS: VASCULAR Aorta: Stable appearance of the thoracic aorta with prior repair of the ascending thoracic aorta. Residual chronic dissection which begins in the distal arch has a stable appearance. Maximum of the distal arch and proximal descending thoracic aorta is 4.3 cm and is stable. Intimal flap extends again to the level of the mid descending thoracic aorta. No evidence of significant enlargement of the false lumen or acute hemorrhage. Heart: Stable cardiac enlargement and evidence of left ventricular hypertrophy. Trace pericardial fluid. Pulmonary Arteries:  Normal caliber central pulmonary arteries. Other: The abdominal aorta and its branches remain unremarkable without evidence of aneurysm or dissection. NON-VASCULAR There is some interval enlargement in size of a right pleural effusion which remains still of fairly small volume. There is a very small left pleural effusion. Small amount of ascites in the peritoneal cavity shows similar appearance to the prior study. IMPRESSION: VASCULAR Stable appearance of residual chronic dissection involving the distal aortic arch and descending thoracic aorta. No significant increased caliber of the  descending aorta which measures 4.3 cm in maximum diameter. No evidence of acute hemorrhage. Dissection again terminates at the level of the mid descending thoracic aorta and does not extend into the abdomen. NON-VASCULAR Some interval enlargement in size of a right pleural effusion. There is a small left pleural effusion. Small amount of ascites in the peritoneal cavity shows similar appearance to the prior study. Electronically Signed   By: Aletta Edouard M.D.   On: 11/29/2017 17:54    Procedures .Critical Care Performed by: Duffy Bruce, MD Authorized by: Duffy Bruce, MD   Critical care provider statement:    Critical care time (minutes):  35   Critical care time was exclusive of:  Separately billable procedures and treating other patients and teaching time   Critical care was necessary to treat or prevent imminent or life-threatening deterioration of the following conditions:  Circulatory failure, cardiac failure and respiratory failure   Critical care was time spent personally by me on the following activities:  Development of treatment plan with patient or surrogate, discussions with consultants, evaluation of patient's response to treatment, examination of patient, obtaining history from patient or surrogate, ordering and performing treatments and interventions, ordering and review of laboratory studies, ordering and review of radiographic studies, pulse oximetry, re-evaluation of patient's condition and review of old charts   I assumed direction of critical care for this patient from another provider in my specialty: no     (including critical care time)  Medications Ordered in ED Medications  0.9 %  sodium chloride infusion ( Intravenous Stopped 11/29/17 2136)  nicardipine (CARDENE) 20mg  in 0.86% saline 233ml IV infusion (0.1 mg/ml) (3 mg/hr Intravenous New Bag/Given 11/29/17 2136)  cefTRIAXone (ROCEPHIN) 1 g in sodium chloride 0.9 % 100 mL IVPB (1 g Intravenous New Bag/Given  11/29/17 2221)  0.9 %  sodium chloride infusion (has no administration in time range)  heparin injection 5,000 Units (has no administration in time range)  0.9 %  sodium chloride infusion (50 mL/hr Intravenous New Bag/Given 11/29/17 2137)  acetaminophen (TYLENOL) tablet 650 mg (has no administration in time range)  morphine 4 MG/ML injection 4 mg (4 mg Intravenous Given 11/29/17 1600)  labetalol (NORMODYNE,TRANDATE) injection 20 mg (20  mg Intravenous Given 11/29/17 1602)  labetalol (NORMODYNE,TRANDATE) injection 20 mg (20 mg Intravenous Given 11/29/17 1628)  nicardipine (CARDENE) 20mg  in 0.86% saline 283ml IV infusion (0.1 mg/ml) (0 mg/hr Intravenous Stopped 11/29/17 2136)     Initial Impression / Assessment and Plan / ED Course  I have reviewed the triage vital signs and the nursing notes.  Pertinent labs & imaging results that were available during my care of the patient were reviewed by me and considered in my medical decision making (see chart for details).     32 year old female here with lightheadedness, left-sided swelling, and some chest pressure and shortness of breath.  Patient markedly hypertensive on arrival.  Concern for recurrent dissection, possibly with graft complication given the unilateral swelling.  No risk factors for thoracic outlet syndrome.  Patient given multiple doses of IV antihypertensives and ultimately placed on a Cardene drip.  She was sent for stat MR, which shows no dissection, though slightly limited by absence of contrast.  However, with blood pressure control her symptoms are markedly improved.  Lab work shows acute on chronic kidney injury.  Will send a UDS given her history of cocaine abuse.  Given her severe hypertension, concern for hypertensive emergency with acute on chronic kidney injury.  She is on Cardene drip, but seems to be responding well and can likely be titrated off relatively soon.  Will admit to stepdown.  Final Clinical Impressions(s) / ED  Diagnoses   Final diagnoses:  Hypertensive emergency    ED Discharge Orders    None       Duffy Bruce, MD 11/29/17 2223

## 2017-11-29 NOTE — H&P (Addendum)
TRH H&P   Patient Demographics:    Valerie Mathis, is a 32 y.o. female  MRN: [id number]   DOB - [birth year]  Admit Date - 11/29/2017  Outpatient Primary MD for the patient is Patient, No Pcp Per  Referring MD/NP/PA:  Lindell Noe  Outpatient Specialists:   No nephrology  Patient coming from: home  Chief Complaint  Patient presents with  . Facial Swelling  . Leg Swelling  . Arm Swelling      HPI:    Valerie Mathis  is a 32 y.o. female, w h/o history of aortic dissection s/p repair ([place], [place]), CHF (EF ?), CKD stage 4  (baseline creatinine 4), TTP, hypertension who presents with sore throat this AM and thus didn't take her bp medications this Am. She then had some facial swelling and swelling of her left arm and leg, which she attributed to to cat exposure.  Pt states better now.    In Ed, found to be hypertensive and in ARF.  Swelling better now, and has normalized.   MRA chest FINDINGS: VASCULAR  Aorta: Stable appearance of the thoracic aorta with prior repair of the ascending thoracic aorta. Residual chronic dissection which begins in the distal arch has a stable appearance. Maximum of the distal arch and proximal descending thoracic aorta is 4.3 cm and is stable. Intimal flap extends again to the level of the mid descending thoracic aorta. No evidence of significant enlargement of the false lumen or acute hemorrhage.  Heart: Stable cardiac enlargement and evidence of left ventricular hypertrophy. Trace pericardial fluid.  Pulmonary Arteries:  Normal caliber central pulmonary arteries.  Other: The abdominal aorta and its branches remain unremarkable without evidence of aneurysm or dissection.  NON-VASCULAR  There is some interval enlargement in size of a right pleural effusion which remains still of fairly small volume. There is a very small left pleural  effusion. Small amount of ascites in the peritoneal cavity shows similar appearance to the prior study.  IMPRESSION: VASCULAR  Stable appearance of residual chronic dissection involving the distal aortic arch and descending thoracic aorta. No significant increased caliber of the descending aorta which measures 4.3 cm in maximum diameter. No evidence of acute hemorrhage. Dissection again terminates at the level of the mid descending thoracic aorta and does not extend into the abdomen.  NON-VASCULAR  Some interval enlargement in size of a right pleural effusion. There is a small left pleural effusion. Small amount of ascites in the peritoneal cavity shows similar appearance to the prior study.   Wbc 4.3, hgb 10.5, Plt 175  Na 143, K 4.2, Bun 50, Creatinine 6.13 Ast 14, Alt 15 Alb 3.2 Lipase 25 INR 1.19 Trop 0.03  Urinalysis wbc 21-50, rbc 11-20  Will admit patient for hypertensive emergency and UTI, and ARF.    Review of systems:    In addition to the HPI above, No Fever-chills,  No Headache, No changes with Vision or hearing, No problems swallowing food or Liquids, No Chest pain, Cough or Shortness of Breath, No Abdominal pain, No Nausea or Vommitting, Bowel movements are regular, No Blood in stool or Urine, No dysuria, No new skin rashes or bruises, No new joints pains-aches,  No new weakness, tingling, numbness in any extremity, No recent weight gain or loss, No polyuria, polydypsia or polyphagia, No significant Mental Stressors.  A full 10 point Review of Systems was done, except as stated above, all other Review of Systems were negative.   With Past History of the following :    Past Medical History:  Diagnosis Date  . Aortic dissection ([place])   . CHF (congestive heart failure) ([place])   . CKD (chronic kidney disease) stage 4, GFR 15-29 ml/min (HCC)   . H/O: alcohol abuse   . Hypertension   . Renal disorder   . TTP (thrombotic thrombocytopenic purpura)  (HCC)       Past Surgical History:  Procedure Laterality Date  . ECTOPIC PREGNANCY SURGERY  2018  . REPAIR OF ACUTE ASCENDING THORACIC AORTIC DISSECTION        Social History:     Social History   Tobacco Use  . Smoking status: Current Every Day Smoker    Packs/day: 0.50    Years: 4.00    Pack years: 2.00    Types: Cigarettes  . Smokeless tobacco: Never Used  Substance Use Topics  . Alcohol use: Yes    Comment: OCCASIONAL     Lives - at home  Mobility - walks by self   Family History :     Family History  Problem Relation Age of Onset  . Hypertension Mother        Home Medications:   Prior to Admission medications   Medication Sig Start Date End Date Taking? Authorizing Provider  amLODipine (NORVASC) 10 MG tablet Take 1 tablet (10 mg total) by mouth daily. for high blood pressure 10/25/17  Yes Santos-Sanchez, Merlene Morse, MD  carvedilol (COREG) 25 MG tablet Take 2 tablets (50 mg total) by mouth 2 (two) times daily with a meal. 10/25/17  Yes Santos-Sanchez, Merlene Morse, MD  furosemide (LASIX) 20 MG tablet Take 2 tablets (40 mg total) by mouth 2 (two) times daily. For high blood pressure/fluid 10/25/17  Yes Santos-Sanchez, Merlene Morse, MD  hydrALAZINE (APRESOLINE) 50 MG tablet Take 1 tablet (50 mg total) by mouth 3 (three) times daily. 10/25/17  Yes Santos-Sanchez, Merlene Morse, MD  sodium bicarbonate 650 MG tablet Take 1 tablet (650 mg total) by mouth 2 (two) times daily. 10/25/17  Yes Santos-Sanchez, Merlene Morse, MD  calcitRIOL (ROCALTROL) 0.25 MCG capsule Take 1 capsule (0.25 mcg total) by mouth daily. Patient not taking: Reported on 11/29/2017 10/25/17   Welford Roche, MD  ferrous sulfate 325 (65 FE) MG tablet Take 1 tablet (325 mg total) by mouth 2 (two) times daily with a meal. Patient not taking: Reported on 11/29/2017 09/15/17   Damita Lack, MD  polyethylene glycol [facility]) packet Take 17 g by mouth daily as needed for moderate constipation or severe constipation. Patient not  taking: Reported on 11/29/2017 10/25/17   Welford Roche, MD     Allergies:    No Known Allergies   Physical Exam:   Vitals  Blood pressure (!) 150/87, pulse 72, temperature 97.7 F (36.5 C), temperature source Oral, resp. rate 16, height 5\' 3"  (1.6 m), weight 64.4 kg (141 lb 14.4 oz), SpO2 100 %.   1. General  lying in bed in NAD,    2. Normal affect and insight, Not Suicidal or Homicidal, Awake Alert, Oriented X 3.  3. No F.N deficits, ALL C.Nerves Intact, Strength 5/5 all 4 extremities, Sensation intact all 4 extremities, Plantars down going.  4. Ears and Eyes appear Normal, Conjunctivae clear, PERRLA. Moist Oral Mucosa.  5. Supple Neck, No JVD, No cervical lymphadenopathy appriciated, No Carotid Bruits.  6. Symmetrical Chest wall movement, Good air movement bilaterally, CTAB., slight decrease in bs right base  No CVA tenderness  7. RRR, No Gallops, Rubs or Murmurs, No Parasternal Heave.  8. Positive Bowel Sounds, Abdomen Soft, No tenderness, No organomegaly appriciated,No rebound -guarding or rigidity.  9.  No Cyanosis, Normal Skin Turgor, No Skin Rash or Bruise.  10. Good muscle tone,  joints appear normal , no effusions, Normal ROM.  11. No Palpable Lymph Nodes in Neck or Axillae    Data Review:    CBC Recent Labs  Lab 11/29/17 1514  WBC 4.3  HGB 10.5*  HCT 31.9*  PLT 175  MCV 91.7  MCH 30.2  MCHC 32.9  RDW 19.6*  LYMPHSABS 0.8  MONOABS 0.4  EOSABS 0.2  BASOSABS 0.0   ------------------------------------------------------------------------------------------------------------------  Chemistries  Recent Labs  Lab 11/29/17 1514  NA 143  K 4.2  CL 114*  CO2 20*  GLUCOSE 89  BUN 50*  CREATININE 6.13*  CALCIUM 7.8*  AST 14*  ALT 15  ALKPHOS 131*  BILITOT 0.5   ------------------------------------------------------------------------------------------------------------------ estimated creatinine clearance is 12.2 mL/min (A) (by C-G  formula based on SCr of 6.13 mg/dL (H)). ------------------------------------------------------------------------------------------------------------------ No results for input(s): TSH, T4TOTAL, T3FREE, THYROIDAB in the last 72 hours.  Invalid input(s): FREET3  Coagulation profile Recent Labs  Lab 11/29/17 1514  INR 1.19   ------------------------------------------------------------------------------------------------------------------- No results for input(s): DDIMER in the last 72 hours. -------------------------------------------------------------------------------------------------------------------  Cardiac Enzymes Recent Labs  Lab 11/29/17 1514  TROPONINI 0.03*   ------------------------------------------------------------------------------------------------------------------    Component Value Date/Time   BNP >4,500.0 (H) 09/12/2017 1531     ---------------------------------------------------------------------------------------------------------------  Urinalysis    Component Value Date/Time   COLORURINE YELLOW 11/29/2017 1922   APPEARANCEUR CLOUDY (A) 11/29/2017 1922   LABSPEC 1.014 11/29/2017 1922   PHURINE 5.0 11/29/2017 1922   GLUCOSEU NEGATIVE 11/29/2017 1922   HGBUR SMALL (A) 11/29/2017 1922   BILIRUBINUR NEGATIVE 11/29/2017 [place] 11/29/2017 1922   PROTEINUR >=300 (A) 11/29/2017 1922   NITRITE NEGATIVE 11/29/2017 1922   LEUKOCYTESUR MODERATE (A) 11/29/2017 1922    ----------------------------------------------------------------------------------------------------------------   Imaging Results:    Mr Jodene Nam Chest Wo Contrast  Result Date: 11/29/2017 CLINICAL DATA:  History prior thoracic aortic dissection with repair of ascending thoracic aortic dissection and 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 Now with chest pain, hypertension and edema involving the left side of the body. Iodinated contrast and  gadolinium cannot be administered due to significant renal failure. EXAM: MRA CHEST WITH OR WITHOUT CONTRAST; MRA ABDOMEN WITH OR WITHOUT CONTRAST TECHNIQUE: Angiographic images of the chest were obtained using MRA technique without intravenous contrast. CONTRAST:  None COMPARISON:  10/20/2017 FINDINGS: VASCULAR Aorta: Stable appearance of the thoracic aorta with prior repair of the ascending thoracic aorta. Residual chronic dissection which begins in the distal arch has a stable appearance. Maximum of the distal arch and proximal descending thoracic aorta is 4.3 cm and is stable. Intimal flap extends again to the level of the mid descending thoracic aorta. No evidence of significant enlargement of  the false lumen or acute hemorrhage. Heart: Stable cardiac enlargement and evidence of left ventricular hypertrophy. Trace pericardial fluid. Pulmonary Arteries:  Normal caliber central pulmonary arteries. Other: The abdominal aorta and its branches remain unremarkable without evidence of aneurysm or dissection. NON-VASCULAR There is some interval enlargement in size of a right pleural effusion which remains still of fairly small volume. There is a very small left pleural effusion. Small amount of ascites in the peritoneal cavity shows similar appearance to the prior study. IMPRESSION: VASCULAR Stable appearance of residual chronic dissection involving the distal aortic arch and descending thoracic aorta. No significant increased caliber of the descending aorta which measures 4.3 cm in maximum diameter. No evidence of acute hemorrhage. Dissection again terminates at the level of the mid descending thoracic aorta and does not extend into the abdomen. NON-VASCULAR Some interval enlargement in size of a right pleural effusion. There is a small left pleural effusion. Small amount of ascites in the peritoneal cavity shows similar appearance to the prior study. Electronically Signed   By: Aletta Edouard M.D.   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 Now with chest pain, hypertension and edema involving the left side of the body. Iodinated contrast and gadolinium cannot be administered due to significant renal failure. EXAM: MRA CHEST WITH OR WITHOUT CONTRAST; MRA ABDOMEN WITH OR WITHOUT CONTRAST TECHNIQUE: Angiographic images of the chest were obtained using MRA technique without intravenous contrast. CONTRAST:  None COMPARISON:  10/20/2017 FINDINGS: VASCULAR Aorta: Stable appearance of the thoracic aorta with prior repair of the ascending thoracic aorta. Residual chronic dissection which begins in the distal arch has a stable appearance. Maximum of the distal arch and proximal descending thoracic aorta is 4.3 cm and is stable. Intimal flap extends again to the level of the mid descending thoracic aorta. No evidence of significant enlargement of the false lumen or acute hemorrhage. Heart: Stable cardiac enlargement and evidence of left ventricular hypertrophy. Trace pericardial fluid. Pulmonary Arteries:  Normal caliber central pulmonary arteries. Other: The abdominal aorta and its branches remain unremarkable without evidence of aneurysm or dissection. NON-VASCULAR There is some interval enlargement in size of a right pleural effusion which remains still of fairly small volume. There is a very small left pleural effusion. Small amount of ascites in the peritoneal cavity shows similar appearance to the prior study. IMPRESSION: VASCULAR Stable appearance of residual chronic dissection involving the distal aortic arch and descending thoracic aorta. No significant increased caliber of the descending aorta which measures 4.3 cm in maximum diameter. No evidence of acute hemorrhage. Dissection again terminates at the level of  the mid descending thoracic aorta and does not extend into the abdomen. NON-VASCULAR Some interval enlargement in size of a right pleural effusion. There is a small left pleural effusion. Small amount of ascites in the peritoneal cavity shows similar appearance to the prior study. Electronically Signed   By: Aletta Edouard M.D.   On: 11/29/2017 17:54    ekg nsr at 70, nl axis, q in 3, avf, no st-t changes c/w ischemia    Assessment & Plan:    Active Problems:   Hypertensive urgency    Hypertensive Emergency Cont Cardene gtt overnite Restart home bp medications D/w EICU, will admit to ICU due to Cardene GTT  Hypertension uncontrolled, see above  Cont amlodipine 10mg  po qday Cont Hydralazine 50mg  po tid Decrease Carvedilol to 25mg  po bid  ARF (pt making urine) on CRF Check urine sodium, urine creatinine, urine eosinophils Hydrate with ns iv gently D/w patient that hydration may cause her to go into CHF, she accepts risk and willing to have gentle hydration.  Check cmp in am Please consult nephrology in AM   CKD stage 4 (baseline creatinine 4) Cont sodium bicarbonate 650mg  po bid  Troponin elevation, likely demand ischemia Trop I q6h x3 Check cardiac echo  H/o CHF Cont hydralatizine 50mg  po tid Decrease Carvedilol 50mg  po bid (this above the max  FDA recommended dose)=> 25mg  po bid Consider restart lasix once Creatinine normalizes Check cardiac echo as above  UTI Urine culture Rocephin 1gm iv qday   DVT Prophylaxis Heparin  SCDs  AM Labs Ordered, also please review Full Orders  Family Communication: Admission, patients condition and plan of care including tests being ordered have been discussed with the patient  who indicate understanding and agree with the plan and Code Status.  Code Status  FULL CODE  Likely DC to  home  Condition GUARDED   Consults called:  PCCM ,. Please consult nephrology in am  Admission status: inpatient  Time spent in minutes : 60  minutes critical care time   Jani Gravel M.D on 11/29/2017 at 8:41 PM  Between 7am to 7pm - Pager - [phone]   After 7pm go to [email or web address] - password [email or web address]  [facility] - Office  [phone]

## 2017-11-29 NOTE — ED Notes (Signed)
Awaiting patient to come back from MRI.

## 2017-11-29 NOTE — ED Notes (Signed)
Report given to nurse on floor 

## 2017-11-29 NOTE — ED Notes (Signed)
Attempted to get another IV x 2

## 2017-11-30 ENCOUNTER — Inpatient Hospital Stay (HOSPITAL_COMMUNITY): Payer: Medicaid Other

## 2017-11-30 DIAGNOSIS — I160 Hypertensive urgency: Secondary | ICD-10-CM

## 2017-11-30 DIAGNOSIS — I361 Nonrheumatic tricuspid (valve) insufficiency: Secondary | ICD-10-CM

## 2017-11-30 LAB — LIPID PANEL
CHOL/HDL RATIO: 4.2 ratio
CHOLESTEROL: 178 mg/dL (ref 0–200)
HDL: 42 mg/dL (ref >40)
LDL Cholesterol: 121 mg/dL — ABNORMAL HIGH (ref 0–99)
TRIGLYCERIDES: 77 mg/dL (ref <150)
VLDL: 15 mg/dL (ref 0–40)

## 2017-11-30 LAB — COMPREHENSIVE METABOLIC PANEL
ALBUMIN: 3.1 g/dL — AB (ref 3.5–5.0)
ALK PHOS: 129 U/L — AB (ref 38–126)
ALT: 13 U/L (ref 0–44)
AST: 9 U/L — AB (ref 15–41)
Anion gap: 10 (ref 5–15)
BUN: 52 mg/dL — ABNORMAL HIGH (ref 6–20)
CALCIUM: 7.8 mg/dL — AB (ref 8.9–10.3)
CHLORIDE: 112 mmol/L — AB (ref 98–111)
CO2: 19 mmol/L — AB (ref 22–32)
Creatinine, Ser: 5.9 mg/dL — ABNORMAL HIGH (ref 0.44–1.00)
GFR calc Af Amer: 10 mL/min — ABNORMAL LOW (ref >60)
GFR calc non Af Amer: 9 mL/min — ABNORMAL LOW (ref >60)
GLUCOSE: 93 mg/dL (ref 70–99)
Potassium: 3.5 mmol/L (ref 3.5–5.1)
SODIUM: 141 mmol/L (ref 135–145)
Total Bilirubin: 0.6 mg/dL (ref 0.3–1.2)
Total Protein: 6.1 g/dL — ABNORMAL LOW (ref 6.5–8.1)

## 2017-11-30 LAB — ECHOCARDIOGRAM COMPLETE
HEIGHTINCHES: 63 in
Weight: 2232.82 oz

## 2017-11-30 LAB — CBC
HEMATOCRIT: 31.7 % — AB (ref 36.0–46.0)
HEMOGLOBIN: 10.5 g/dL — AB (ref 12.0–15.0)
MCH: 30 pg (ref 26.0–34.0)
MCHC: 33.1 g/dL (ref 30.0–36.0)
MCV: 90.6 fL (ref 78.0–100.0)
Platelets: 246 10*3/uL (ref 150–400)
RBC: 3.5 MIL/uL — ABNORMAL LOW (ref 3.87–5.11)
RDW: 19.6 % — AB (ref 11.5–15.5)
WBC: 5.4 10*3/uL (ref 4.0–10.5)

## 2017-11-30 LAB — MRSA PCR SCREENING: MRSA by PCR: NEGATIVE

## 2017-11-30 LAB — TROPONIN I
Troponin I: 0.04 ng/mL (ref <0.03)
Troponin I: 0.04 ng/mL (ref <0.03)

## 2017-11-30 MED ORDER — HYDROCHLOROTHIAZIDE 25 MG PO TABS
25.0000 mg | ORAL_TABLET | Freq: Every day | ORAL | Status: DC
  Administered 2017-11-30 – 2017-12-01 (×2): 25 mg via ORAL
  Filled 2017-11-30 (×2): qty 1

## 2017-11-30 MED ORDER — MORPHINE SULFATE (PF) 2 MG/ML IV SOLN
2.0000 mg | INTRAVENOUS | Status: DC | PRN
  Administered 2017-11-30 – 2017-12-01 (×3): 2 mg via INTRAVENOUS
  Filled 2017-11-30 (×3): qty 1

## 2017-11-30 MED ORDER — HYDRALAZINE HCL 50 MG PO TABS
100.0000 mg | ORAL_TABLET | Freq: Three times a day (TID) | ORAL | Status: DC
  Administered 2017-11-30 – 2017-12-03 (×9): 100 mg via ORAL
  Filled 2017-11-30 (×9): qty 2

## 2017-11-30 MED ORDER — HYDRALAZINE HCL 50 MG PO TABS: 100.0000 mg | ORAL_TABLET | Freq: Three times a day (TID) | ORAL | Status: DC

## 2017-11-30 MED ORDER — DEXTROSE 5 % IV SOLN
120.0000 mg | Freq: Two times a day (BID) | INTRAVENOUS | Status: DC
  Filled 2017-11-30: qty 12

## 2017-11-30 NOTE — Progress Notes (Signed)
[facility] Progress Note  Patient: Valerie Mathis TIW:[id number]   PCP: Patient, No Pcp Per DOB: August 10, 1985   DOA: 11/29/2017   DOS: 11/30/2017   Date of Service: the patient was seen and examined on 11/30/2017  Subjective: Feeling better, no chest pain or shortness of breath.  Has some back pain which is chronic.  No nausea no vomiting no fever no chills.  Peeing okay although no output documented.  Brief hospital course: Pt. with PMH of type B aortic dissection S/P graft repair, chronic combined CHF, chronic kidney disease stage IV, TTP, HTN, substance abuse; admitted on 11/29/2017, presented with complaint of swelling and weakness, was found to have hypertensive emergency secondary to noncompliance with medication and cocaine use. Currently further plan is controlled blood pressure.  Assessment and Plan: Hypertensive Emergency Cocaine abuse Patient is still on Cardene infusion although much lower dose. Restart home bp medications ICU feels that the patient can remain under TRS service for now. Blood pressure is well controlled for now monitor. Appreciate nephrology input.  Hypertension uncontrolled, see above Cont amlodipine 10mg  po qday Cont Hydralazine 100mg  po tid Decrease Carvedilol to 25mg  po bid Patient was on clonidine in the past which has been discontinued as patient's poor compliance can lead to rebound hypertension. Patient was counseled for quit using cocaine  Acute kidney injury on CKD stage 4 (baseline creatinine 4) Discontinue bicarb. Nephrology consulted, appreciate input.  Started on IV Lasix.  Troponin elevation, likely demand ischemia Troponins are unremarkable. Echocardiogram ordered.  H/o CHF Cont hydralazine dose increased by nephrology. Decrease Carvedilol 50mg  po bid (this above the max  FDA recommended dose)=> 25mg  po bid IV Lasix started by nephrology. Echocardiogram ordered  UTI Urine culture Rocephin 1gm iv qday  Diet: Renal diet DVT  Prophylaxis: subcutaneous Heparin  Advance goals of care discussion: full code  Family Communication: no family was present at bedside, at the time of interview.   Disposition:  Discharge to home.  Consultants: PCCM nephrology Procedures: Echocardiogram   Antibiotics: Anti-infectives (From admission, onward)   Start     Dose/Rate Route Frequency Ordered Stop   11/29/17 2130  cefTRIAXone (ROCEPHIN) 1 g in sodium chloride 0.9 % 100 mL IVPB  Status:  Discontinued     1 g 200 mL/hr over 30 Minutes Intravenous Every 24 hours 11/29/17 2106 11/30/17 1456       Objective: Physical Exam: Vitals:   11/30/17 1400 11/30/17 1500 11/30/17 1600 11/30/17 1630  BP: (!) 161/85 (!) 166/82 (!) 167/73 (!) 140/56  Pulse: 85 73 78 83  Resp: 19 15 13 11   Temp:   98.6 F (37 C)   TempSrc:   Oral   SpO2: 98% 97% 98% 93%  Weight:      Height:        Intake/Output Summary (Last 24 hours) at 11/30/2017 1845 Last data filed at 11/30/2017 1700 Gross per 24 hour  Intake 1350.83 ml  Output 650 ml  Net 700.83 ml   Filed Weights   11/29/17 1349 11/29/17 2338 11/30/17 0444  Weight: 64.4 kg (141 lb 14.4 oz) 63.3 kg (139 lb 8.8 oz) 63.3 kg (139 lb 8.8 oz)   General: Alert, Awake and Oriented to Time, Place and Person. Appear in mild distress, affect appropriateEyes: PERRL, Conjunctiva normal ENT: Oral Mucosa clear moist Neck: no JVD, no Abnormal Mass Or lumps Cardiovascular: S1 and S2 Present, no Murmur, Peripheral Pulses Present Respiratory: normal respiratory effort, Bilateral Air entry equal and Decreased, no use of accessory muscle,  Clear to Auscultation, no Crackles, no wheezes Abdomen: Bowel Sound present, Soft and no tenderness, no hernia Skin: no redness, no Rash, no induration Extremities: no Pedal edema, no calf tenderness Neurologic: Grossly no focal neuro deficit. Bilaterally Equal motor strength  Data Reviewed: CBC: Recent Labs  Lab 11/29/17 1514 11/30/17 0354  WBC 4.3 5.4    NEUTROABS 2.9  --   HGB 10.5* 10.5*  HCT 31.9* 31.7*  MCV 91.7 90.6  PLT 175 919   Basic Metabolic Panel: Recent Labs  Lab 11/29/17 1514 11/29/17 2151 11/30/17 0354  NA 143  --  141  K 4.2  --  3.5  CL 114*  --  112*  CO2 20*  --  19*  GLUCOSE 89  --  93  BUN 50*  --  52*  CREATININE 6.13* 6.15* 5.90*  CALCIUM 7.8*  --  7.8*    Liver Function Tests: Recent Labs  Lab 11/29/17 1514 11/30/17 0354  AST 14* 9*  ALT 15 13  ALKPHOS 131* 129*  BILITOT 0.5 0.6  PROT 6.2* 6.1*  ALBUMIN 3.2* 3.1*   Recent Labs  Lab 11/29/17 1514  LIPASE 25   No results for input(s): AMMONIA in the last 168 hours. Coagulation Profile: Recent Labs  Lab 11/29/17 1514  INR 1.19   Cardiac Enzymes: Recent Labs  Lab 11/29/17 1514 11/29/17 2151 11/30/17 0354 11/30/17 1042  TROPONINI 0.03* 0.03* 0.04* 0.04*   BNP (last 3 results) No results for input(s): PROBNP in the last 8760 hours. CBG: No results for input(s): GLUCAP in the last 168 hours. Studies: No results found.  Scheduled Meds: . amLODipine  10 mg Oral Daily  . carvedilol  25 mg Oral BID WC  . heparin  5,000 Units Subcutaneous Q8H  . hydrALAZINE  100 mg Oral TID  . hydrochlorothiazide  25 mg Oral Daily   Continuous Infusions: . sodium chloride Stopped (11/29/17 2136)  . sodium chloride    . niCARDipine Stopped (11/30/17 1713)   PRN Meds: sodium chloride, acetaminophen, morphine injection  Time spent: 35 minutes  Author: Berle Mull, MD [facility] Pager: [phone] 11/30/2017 6:45 PM  If 7PM-7AM, please contact night-coverage at [email or web address], password [facility]

## 2017-11-30 NOTE — Progress Notes (Signed)
.. ..  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 PCCM consulted when pt was started on Cardene ggt.  SIGNIFICANT EVENTS   - hypertensive emergency  STUDIES:  MRA chest Ab/pelvis Stable appearance of residual chronic dissection involving the distal aortic arch and descending thoracic aorta. No significant increased caliber of the descending aorta which measures 4.3 cm in maximum diameter. No evidence of acute hemorrhage. Dissection again terminates at the level of the mid descending thoracic aorta and does not extend into the abdomen. Some interval enlargement in size of a right pleural effusion. There is a small left pleural effusion. Small amount of ascites in the peritoneal cavity shows similar appearance to the prior study  SUBJECTIVE/INTERVAL:  Transferred to the intensive care SBP at goal  CR improved some since admit  Feels like breathing better   VITAL SIGNS: Temp:  [97.6 F (36.4 C)-98 F (36.7 C)] 97.8 F (36.6 C) (07/17 0800) Pulse Rate:  [68-85] 75 (07/17 0730) Resp:  [12-23] 16 (07/17 0730) BP: (139-250)/(68-155) 154/78 (07/17 0730) SpO2:  [89 %-100 %] 96 % (07/17 0730) Weight:  [139 lb 8.8 oz (63.3 kg)-141 lb 14.4 oz (64.4 kg)] 139 lb 8.8 oz (63.3 kg) (07/17 0444)  Intake/Output Summary (Last 24 hours) at 11/30/2017 1223 Last data filed at 11/30/2017 0827 Gross per 24 hour  Intake 1170.83 ml  Output 150 ml  Net 1020.83 ml    PHYSICAL EXAMINATION:  General: no distress. Feels shortness of breath improved.  HENT:  NCAT MMM, No JVD Pulm: clear; no accessory use  Card: RRR no MRG Ext: no edema brisk CR. Warm and dry  Abd: soft + bowel sounds Neuro: awake alert, no focal def   Recent Labs  Lab 11/29/17 1514 11/29/17 2151 11/30/17 0354  NA 143  --  141  K 4.2  --  3.5  CL 114*  --  112*  CO2 20*  --  19*  BUN 50*  --  52*  CREATININE 6.13* 6.15* 5.90*  GLUCOSE 89  --  93   Recent Labs  Lab 11/29/17 1514 11/30/17 0354  HGB 10.5* 10.5*  HCT 31.9* 31.7*  WBC 4.3 5.4  PLT 175 246   Mr Mra Chest Wo Contrast  Result Date: 11/29/2017 CLINICAL DATA:  History prior thoracic aortic dissection with repair of ascending thoracic aortic dissection and 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 Now with chest pain, hypertension and edema involving the left side of the body. Iodinated contrast and gadolinium cannot be administered due to significant renal failure. EXAM: MRA CHEST WITH OR WITHOUT CONTRAST; MRA ABDOMEN WITH OR WITHOUT CONTRAST TECHNIQUE: Angiographic images of the chest were obtained using MRA technique without intravenous contrast. CONTRAST:  None COMPARISON:  10/20/2017 FINDINGS: VASCULAR Aorta: Stable appearance of the thoracic aorta with prior repair of the ascending thoracic aorta. Residual chronic dissection which begins in the distal arch has a stable appearance. Maximum of the distal arch and proximal descending thoracic aorta is 4.3 cm and is stable. Intimal flap extends again to the level of the  mid descending thoracic aorta. No evidence of significant enlargement of the false lumen or acute hemorrhage. Heart: Stable cardiac enlargement and evidence of left ventricular hypertrophy. Trace pericardial fluid. Pulmonary Arteries:  Normal caliber central pulmonary arteries. Other: The abdominal aorta and its branches remain unremarkable without evidence of aneurysm or dissection. NON-VASCULAR There is some interval enlargement in size of a right pleural effusion which  remains still of fairly small volume. There is a very small left pleural effusion. Small amount of ascites in the peritoneal cavity shows similar appearance to the prior study. IMPRESSION: VASCULAR Stable appearance of residual chronic dissection involving the distal aortic arch and descending thoracic aorta. No significant increased caliber of the descending aorta which measures 4.3 cm in maximum diameter. No evidence of acute hemorrhage. Dissection again terminates at the level of the mid descending thoracic aorta and does not extend into the abdomen. NON-VASCULAR Some interval enlargement in size of a right pleural effusion. There is a small left pleural effusion. Small amount of ascites in the peritoneal cavity shows similar appearance to the prior study. Electronically Signed   By: Aletta Edouard M.D.   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 Now with chest pain, hypertension and edema involving the left side of the body. Iodinated contrast and gadolinium cannot be administered due to significant renal failure. EXAM: MRA CHEST WITH OR WITHOUT CONTRAST; MRA ABDOMEN WITH OR WITHOUT CONTRAST TECHNIQUE: Angiographic images of the chest were obtained using MRA technique without intravenous contrast. CONTRAST:  None COMPARISON:  10/20/2017 FINDINGS: VASCULAR Aorta: Stable appearance of the thoracic aorta with prior repair of the ascending thoracic aorta. Residual chronic dissection which begins in the distal arch has a stable appearance. Maximum of the distal arch and proximal descending thoracic aorta is 4.3 cm and is stable. Intimal flap extends again to the level of the mid descending thoracic aorta. No evidence of significant enlargement of the false lumen or acute hemorrhage. Heart: Stable  cardiac enlargement and evidence of left ventricular hypertrophy. Trace pericardial fluid. Pulmonary Arteries:  Normal caliber central pulmonary arteries. Other: The abdominal aorta and its branches remain unremarkable without evidence of aneurysm or dissection. NON-VASCULAR There is some interval enlargement in size of a right pleural effusion which remains still of fairly small volume. There is a very small left pleural effusion. Small amount of ascites in the peritoneal cavity shows similar appearance to the prior study. IMPRESSION: VASCULAR Stable appearance of residual chronic dissection involving the distal aortic arch and descending thoracic aorta. No significant increased caliber of the descending aorta which measures 4.3 cm in maximum diameter. No evidence of acute hemorrhage. Dissection again terminates at the level of the mid descending thoracic aorta and does not extend into the abdomen. NON-VASCULAR Some interval enlargement in size of a right pleural effusion. There is a small left pleural effusion. Small amount of ascites in the peritoneal cavity shows similar appearance to the prior study. Electronically Signed   By: Aletta Edouard M.D.   On: 11/29/2017 17:54    ASSESSMENT / PLAN:  1. HTN Emergency 2. HF( possibly combined systolic and diastolic dysfunction) 3. Bilateral pleural effusion ( right >left) 4. Acute on Chronic Kidney Injury  Last ECHO was in April 2019 and showed Severe hypertensive cardiomyopathy/LVH. Mildly depressed LVEF,possibly due to severe afterload increase.  Discussion No distress. Feeling better. Weaning antihypertensive gtts  Plan Cont to wean Cardene Added oral antihypertensives Repeat CXR am Agree needs nephro consults    DVT prophylaxis: [place] heparin SUP: NA  Diet: no added salt  Activity: BR Disposition : ICU   Erick Colace ACNP-BC [place] Pager # [phone] OR # [phone] if no answer   11/30/2017, 11:31 AM

## 2017-11-30 NOTE — Progress Notes (Signed)
  Echocardiogram 2D Echocardiogram has been performed.  Merrie Roof F 11/30/2017, 11:55 AM

## 2017-11-30 NOTE — Consult Note (Signed)
Valerie Mathis Admit Date: 11/29/2017 11/30/2017 [place] Agent Requesting Physician:  Posey Pronto MD  Reason for Consult:  AoCKD4, HTN Emergency HPI:  32 year old female who was admitted to the hospital yesterday after presenting with swelling of the face arms and legs on the left side; she was found to have hypertensive emergency and acute on chronic renal failure.  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  Prior to admission she was not able to take her medications because of foot pain including her antihypertensives which she cannot recall my name but I believe would be amlodipine, carvedilol, furosemide; I do not believe she is taking hydralazine at the current time.  She was placed on parenteral nicardipine for her hypertension and her outpatient medications were resumed.  She denies chest pain, back pain, dyspnea, abdominal pain, vision changes.  She does complain of a mild diffuse headache.  Renal function had worsened upon admission to 6.13 from a previous discharge creatinine around 4.  Potassium is okay at 4.2 and serum bicarbonate is 20.  This morning her creatinine is 5.9, slightly improved.  Platelet count is 246, hemoglobin 10.5.  Admission urinalysis with 4+ protein and historical 24-hour urine protein evaluation was 1.4 g.  Hematuria also present.  Urine drug screen is positive for cocaine.  Previous renal ultrasound with normal-sized echogenic kidneys without acute structural or obstructive process.  Renal Doppler study in May of this year was negative for renal artery  stenosis.  Patient denies NSAID use.  She states that her life is very stressful at the current time, largely related to financial matters, health matters, recently relocating to [place] to live with her sister, previously living in [place] with her mother.   Creatinine, Ser (mg/dL)  Date Value  11/30/2017 5.90 (H)  11/29/2017 6.15 (H)  11/29/2017 6.13 (H)  10/25/2017 4.32 (H)  10/24/2017 4.02 (H)  10/23/2017 4.03 (H)  10/22/2017 4.17 (H)  10/21/2017 4.22 (H)  10/20/2017 4.10 (H)  10/20/2017 4.21 (H)  ] I/Os:  ROS NSAIDS: Denies IV Contrast no exposure TMP/SMX no exposure Hypotension absent Balance of 12 systems is negative w/ exceptions as above  PMH  Past Medical History:  Diagnosis Date  . Aortic dissection ([place])   . CHF (congestive heart failure) ([place])   . CKD (chronic kidney disease) stage 4, GFR 15-29 ml/min ([facility])   . H/O: alcohol abuse   . Hypertension   . Renal disorder   . TTP (thrombotic thrombocytopenic purpura) ([facility])    PSH  Past Surgical History:  Procedure Laterality Date  . ECTOPIC PREGNANCY SURGERY  2018  . REPAIR OF ACUTE ASCENDING THORACIC AORTIC DISSECTION     FH  Family History  Problem Relation Age of Onset  . Hypertension Mother    SH  reports that she has been smoking cigarettes.  She has a 2.00 pack-year smoking history. She has never used smokeless tobacco. She reports that she drinks alcohol. She reports that she has current or past drug history. Drugs: Cocaine and Benzodiazepines. Allergies No Known Allergies Home medications Prior to Admission medications   Medication Sig Start Date End Date Taking? Authorizing  Provider  amLODipine (NORVASC) 10 MG tablet Take 1 tablet (10 mg total) by mouth daily. for high blood pressure 10/25/17  Yes Santos-Sanchez, Merlene Morse, MD  carvedilol (COREG) 25 MG tablet Take 2 tablets (50 mg total) by mouth 2 (two) times daily with a meal. 10/25/17  Yes Santos-Sanchez, Merlene Morse, MD  furosemide (LASIX) 20  MG tablet Take 2 tablets (40 mg total) by mouth 2 (two) times daily. For high blood pressure/fluid 10/25/17  Yes Santos-Sanchez, Merlene Morse, MD  hydrALAZINE (APRESOLINE) 50 MG tablet Take 1 tablet (50 mg total) by mouth 3 (three) times daily. 10/25/17  Yes Santos-Sanchez, Merlene Morse, MD  sodium bicarbonate 650 MG tablet Take 1 tablet (650 mg total) by mouth 2 (two) times daily. 10/25/17  Yes Santos-Sanchez, Merlene Morse, MD  calcitRIOL (ROCALTROL) 0.25 MCG capsule Take 1 capsule (0.25 mcg total) by mouth daily. Patient not taking: Reported on 11/29/2017 10/25/17   Welford Roche, MD  ferrous sulfate 325 (65 FE) MG tablet Take 1 tablet (325 mg total) by mouth 2 (two) times daily with a meal. Patient not taking: Reported on 11/29/2017 09/15/17   Damita Lack, MD  polyethylene glycol [facility]) packet Take 17 g by mouth daily as needed for moderate constipation or severe constipation. Patient not taking: Reported on 11/29/2017 10/25/17   Welford Roche, MD    Current Medications Scheduled Meds: . amLODipine  10 mg Oral Daily  . carvedilol  25 mg Oral BID WC  . heparin  5,000 Units Subcutaneous Q8H  . hydrALAZINE  50 mg Oral TID  . sodium bicarbonate  650 mg Oral BID   Continuous Infusions: . sodium chloride Stopped (11/29/17 2136)  . sodium chloride    . cefTRIAXone (ROCEPHIN)  IV Stopped (11/29/17 2251)  . niCARDipine 3 mg/hr (11/30/17 1203)   PRN Meds:.sodium chloride, acetaminophen  CBC Recent Labs  Lab 11/29/17 1514 11/30/17 0354  WBC 4.3 5.4  NEUTROABS 2.9  --   HGB 10.5* 10.5*  HCT 31.9* 31.7*  MCV 91.7 90.6  PLT 175 630   Basic Metabolic Panel Recent Labs  Lab 11/29/17 1514 11/29/17 2151 11/30/17 0354  NA 143  --  141  K 4.2  --  3.5  CL 114*  --  112*  CO2 20*  --  19*  GLUCOSE 89  --  93  BUN 50*  --  52*  CREATININE 6.13* 6.15* 5.90*  CALCIUM 7.8*  --  7.8*    Physical Exam  Blood pressure (!) 161/85, pulse 85, temperature (!) 97.4 F (36.3 C), temperature  source Axillary, resp. rate 19, height 5\' 3"  (1.6 m), weight 63.3 kg (139 lb 8.8 oz), SpO2 98 %. GEN: No acute distress, lying in hospital bed ENT: NCAT EYES: EOMI CV: RRR, normal S1-S2, no rub PULM: Clear bilaterally, normal work of breathing ABD: Soft, nontender, nondistended SKIN: No rashes or lesions EXT: Trace peripheral edema in the legs   Assessment 32 year old female with acute on chronic renal failure, baseline stage IV CKD; hypertension emergency related to nonadherence to medications and cocaine use.  She likely has an acute kidney injury from uncontrolled hypertension, hopefully this will show some recovery with time and improve blood pressure management.  If not, she is very close to dialysis and might need to initiate therapy in the hospital or at least pursue vascular access.  All of this discussed with the patient.  She is hypervolemic and has significant impairment in sodium excretion, we need to add diuretics at the current time.  1. AoCKD4;  presumed HTN/FSGS related; neg renal US and renal doppler this year; subnephrotic 2. HTN, emergency; nonadherent to outpt medications 3. Cocaine use 4. Hx/o thoracic aortic aneurysm repaor 5. 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 ?hx/o TTP  Plan 1. Increase hydralazine to 100 mg 3 times daily 2. Initiate furosemide 120 mg IV twice daily 3. Wean nicardipine drip as able 4. Daily weights, Daily Renal Panel, Strict I/Os, Avoid nephrotoxins (NSAIDs, judicious IV Contrast) 5. Hold NaHCOe with hpyervolemia and noncompliance.    Pearson Grippe MD [phone] pgr 11/30/2017, 2:33 PM

## 2017-12-01 LAB — CBC WITH DIFFERENTIAL/PLATELET
BASOS ABS: 0.1 10*3/uL (ref 0.0–0.1)
Basophils Relative: 1 %
Eosinophils Absolute: 0.4 10*3/uL (ref 0.0–0.7)
Eosinophils Relative: 7 %
HEMATOCRIT: 31.4 % — AB (ref 36.0–46.0)
Hemoglobin: 10.3 g/dL — ABNORMAL LOW (ref 12.0–15.0)
LYMPHS PCT: 23 %
Lymphs Abs: 1.2 10*3/uL (ref 0.7–4.0)
MCH: 29.5 pg (ref 26.0–34.0)
MCHC: 32.8 g/dL (ref 30.0–36.0)
MCV: 90 fL (ref 78.0–100.0)
Monocytes Absolute: 0.5 10*3/uL (ref 0.1–1.0)
Monocytes Relative: 9 %
Neutro Abs: 3 10*3/uL (ref 1.7–7.7)
Neutrophils Relative %: 60 %
Platelets: 266 10*3/uL (ref 150–400)
RBC: 3.49 MIL/uL — AB (ref 3.87–5.11)
RDW: 20.1 % — ABNORMAL HIGH (ref 11.5–15.5)
WBC: 5 10*3/uL (ref 4.0–10.5)

## 2017-12-01 LAB — COMPREHENSIVE METABOLIC PANEL
ALK PHOS: 120 U/L (ref 38–126)
ALT: 13 U/L (ref 0–44)
AST: 9 U/L — AB (ref 15–41)
Albumin: 3.1 g/dL — ABNORMAL LOW (ref 3.5–5.0)
Anion gap: 11 (ref 5–15)
BUN: 47 mg/dL — AB (ref 6–20)
CALCIUM: 8.2 mg/dL — AB (ref 8.9–10.3)
CO2: 18 mmol/L — ABNORMAL LOW (ref 22–32)
CREATININE: 5.69 mg/dL — AB (ref 0.44–1.00)
Chloride: 111 mmol/L (ref 98–111)
GFR, EST AFRICAN AMERICAN: 11 mL/min — AB (ref >60)
GFR, EST NON AFRICAN AMERICAN: 9 mL/min — AB (ref >60)
Glucose, Bld: 98 mg/dL (ref 70–99)
Potassium: 3.7 mmol/L (ref 3.5–5.1)
Sodium: 140 mmol/L (ref 135–145)
TOTAL PROTEIN: 6.1 g/dL — AB (ref 6.5–8.1)
Total Bilirubin: 0.8 mg/dL (ref 0.3–1.2)

## 2017-12-01 LAB — URINE CULTURE

## 2017-12-01 LAB — CALCIUM / CREATININE RATIO, URINE
CREATININE, UR: 124.3 mg/dL
Calcium, Ur: <0.8 mg/dL

## 2017-12-01 LAB — MAGNESIUM: MAGNESIUM: 2.3 mg/dL (ref 1.7–2.4)

## 2017-12-01 MED ORDER — FUROSEMIDE 10 MG/ML IJ SOLN
120.0000 mg | Freq: Two times a day (BID) | INTRAVENOUS | Status: DC
  Administered 2017-12-01 (×2): 120 mg via INTRAVENOUS
  Filled 2017-12-01 (×2): qty 10
  Filled 2017-12-01: qty 12

## 2017-12-01 MED ORDER — HYDRALAZINE HCL 20 MG/ML IJ SOLN
5.0000 mg | INTRAMUSCULAR | Status: DC | PRN
  Administered 2017-12-01 (×2): 5 mg via INTRAVENOUS
  Filled 2017-12-01 (×2): qty 1

## 2017-12-01 MED ORDER — CLONIDINE HCL 0.2 MG PO TABS
0.2000 mg | ORAL_TABLET | Freq: Three times a day (TID) | ORAL | Status: DC
  Administered 2017-12-01 – 2017-12-03 (×7): 0.2 mg via ORAL
  Filled 2017-12-01 (×4): qty 1
  Filled 2017-12-01: qty 2
  Filled 2017-12-01: qty 1
  Filled 2017-12-01: qty 2

## 2017-12-01 MED ORDER — HYDRALAZINE HCL 20 MG/ML IJ SOLN
10.0000 mg | INTRAMUSCULAR | Status: DC | PRN
  Administered 2017-12-01 – 2017-12-03 (×2): 10 mg via INTRAVENOUS
  Filled 2017-12-01 (×2): qty 1

## 2017-12-01 NOTE — Progress Notes (Signed)
[facility] Progress Note  Patient: Valerie Mathis ZOX:[id number]   PCP: Patient, No Pcp Per DOB: May 17, 1986   DOA: 11/29/2017   DOS: 12/01/2017   Date of Service: the patient was seen and examined on 12/01/2017  Subjective: No acute complaint of nausea no vomiting.  Overnight blood pressure significantly elevated.  Brief hospital course: Pt. with PMH of type B aortic dissection S/P graft repair, chronic combined CHF, chronic kidney disease stage IV, TTP, HTN, substance abuse; admitted on 11/29/2017, presented with complaint of swelling and weakness, was found to have hypertensive emergency secondary to noncompliance with medication and cocaine use. Currently further plan is controlled blood pressure.  Assessment and Plan: Hypertensive Emergency Cocaine abuse Started on nicardipine infusion.  Currently off of them. Restart home bp medications ICU recommended admission under TRH. Blood pressure is well controlled for now monitor. Appreciate nephrology input.  Hypertension uncontrolled, see above Cont amlodipine 10mg  po qday Cont Hydralazine 100mg  po tid Decrease Carvedilol to 25mg  po bid Patient was on clonidine in the past which has been discontinued as patient's poor compliance can lead to rebound hypertension. Patient was counseled for quit using cocaine Clonidine resumed as well.  Monitor.  Acute kidney injury on CKD stage 4 (baseline creatinine 4) Discontinue bicarb. Nephrology consulted, appreciate input.  Started on IV Lasix.  Troponin elevation, likely demand ischemia Troponins are unremarkable. Echocardiogram ordered.  H/o CHF Cont hydralazine dose increased by nephrology. Decrease Carvedilol 50mg  po bid (this above the max  FDA recommended dose)=> 25mg  po bid IV Lasix started by nephrology. Echocardiogram ordered  UTI Urine culture Rocephin 1gm iv qday, total 3-day treatment.  Diet: Renal diet DVT Prophylaxis: subcutaneous Heparin  Advance goals of care  discussion: full code  Family Communication: no family was present at bedside, at the time of interview.   Disposition:  Discharge to home.  Consultants: PCCM nephrology Procedures: Echocardiogram   Antibiotics: Anti-infectives (From admission, onward)   Start     Dose/Rate Route Frequency Ordered Stop   11/29/17 2130  cefTRIAXone (ROCEPHIN) 1 g in sodium chloride 0.9 % 100 mL IVPB  Status:  Discontinued     1 g 200 mL/hr over 30 Minutes Intravenous Every 24 hours 11/29/17 2106 11/30/17 1456       Objective: Physical Exam: Vitals:   12/01/17 1400 12/01/17 1500 12/01/17 1545 12/01/17 1742  BP: (!) 146/76 (!) 145/103  139/72  Pulse: 67 71  69  Resp:    16  Temp:   97.8 F (36.6 C) 97.6 F (36.4 C)  TempSrc:   Oral Oral  SpO2:    100%  Weight:    66.5 kg (146 lb 9.7 oz)  Height:    5\' 3"  (1.6 m)    Intake/Output Summary (Last 24 hours) at 12/01/2017 1923 Last data filed at 12/01/2017 1750 Gross per 24 hour  Intake 218.5 ml  Output 2850 ml  Net -2631.5 ml   Filed Weights   11/30/17 0444 12/01/17 0500 12/01/17 1742  Weight: 63.3 kg (139 lb 8.8 oz) 64.4 kg (141 lb 15.6 oz) 66.5 kg (146 lb 9.7 oz)   General: Alert, Awake and Oriented to Time, Place and Person. Appear in mild distress, affect appropriateEyes: PERRL, Conjunctiva normal ENT: Oral Mucosa clear moist Neck: no JVD, no Abnormal Mass Or lumps Cardiovascular: S1 and S2 Present, no Murmur, Peripheral Pulses Present Respiratory: normal respiratory effort, Bilateral Air entry equal and Decreased, no use of accessory muscle, Clear to Auscultation, no Crackles, no wheezes Abdomen: Bowel Sound  present, Soft and no tenderness, no hernia Skin: no redness, no Rash, no induration Extremities: no Pedal edema, no calf tenderness Neurologic: Grossly no focal neuro deficit. Bilaterally Equal motor strength  Data Reviewed: CBC: Recent Labs  Lab 11/29/17 1514 11/30/17 0354 12/01/17 0338  WBC 4.3 5.4 5.0  NEUTROABS 2.9   --  3.0  HGB 10.5* 10.5* 10.3*  HCT 31.9* 31.7* 31.4*  MCV 91.7 90.6 90.0  PLT 175 246 914   Basic Metabolic Panel: Recent Labs  Lab 11/29/17 1514 11/29/17 2151 11/30/17 0354 12/01/17 0338  NA 143  --  141 140  K 4.2  --  3.5 3.7  CL 114*  --  112* 111  CO2 20*  --  19* 18*  GLUCOSE 89  --  93 98  BUN 50*  --  52* 47*  CREATININE 6.13* 6.15* 5.90* 5.69*  CALCIUM 7.8*  --  7.8* 8.2*  MG  --   --   --  2.3    Liver Function Tests: Recent Labs  Lab 11/29/17 1514 11/30/17 0354 12/01/17 0338  AST 14* 9* 9*  ALT 15 13 13   ALKPHOS 131* 129* 120  BILITOT 0.5 0.6 0.8  PROT 6.2* 6.1* 6.1*  ALBUMIN 3.2* 3.1* 3.1*   Recent Labs  Lab 11/29/17 1514  LIPASE 25   No results for input(s): AMMONIA in the last 168 hours. Coagulation Profile: Recent Labs  Lab 11/29/17 1514  INR 1.19   Cardiac Enzymes: Recent Labs  Lab 11/29/17 1514 11/29/17 2151 11/30/17 0354 11/30/17 1042  TROPONINI 0.03* 0.03* 0.04* 0.04*   BNP (last 3 results) No results for input(s): PROBNP in the last 8760 hours. CBG: No results for input(s): GLUCAP in the last 168 hours. Studies: No results found.  Scheduled Meds: . amLODipine  10 mg Oral Daily  . carvedilol  25 mg Oral BID WC  . cloNIDine  0.2 mg Oral TID  . heparin  5,000 Units Subcutaneous Q8H  . hydrALAZINE  100 mg Oral TID   Continuous Infusions: . sodium chloride    . furosemide Stopped (12/01/17 1239)   PRN Meds: sodium chloride, acetaminophen, hydrALAZINE  Time spent: 35 minutes  Author: Berle Mull, MD [facility] Pager: [phone] 12/01/2017 7:23 PM  If 7PM-7AM, please contact night-coverage at [email or web address], password [facility]

## 2017-12-01 NOTE — Progress Notes (Addendum)
.. ..  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 PCCM consulted when pt was started on Cardene ggt.  SIGNIFICANT EVENTS   - hypertensive emergency  STUDIES:  MRA chest Ab/pelvis Stable appearance of residual chronic dissection involving the distal aortic arch and descending thoracic aorta. No significant increased caliber of the descending aorta which measures 4.3 cm in maximum diameter. No evidence of acute hemorrhage. Dissection again terminates at the level of the mid descending thoracic aorta and does not extend into the abdomen. Some interval enlargement in size of a right pleural effusion. There is a small left pleural effusion. Small amount of ascites in the peritoneal cavity shows similar appearance to the prior study  SUBJECTIVE/INTERVAL:  Cont to improve  VITAL SIGNS: Temp:  [97.4 F (36.3 C)-98.6 F (37 C)] 97.6 F (36.4 C) (07/18 0745) Pulse Rate:  [68-97] 78 (07/18 0900) Resp:  [11-24] 12 (07/18 0900) BP: (140-209)/(56-122) 186/101 (07/18 0900) SpO2:  [93 %-100 %] 99 % (07/18 0900) Weight:  [141 lb 15.6 oz (64.4 kg)] 141 lb 15.6 oz (64.4 kg) (07/18 0500)  Intake/Output Summary (Last 24 hours) at 12/01/2017 1110 Last data filed at 12/01/2017 0542 Gross per 24 hour  Intake 336.5 ml  Output 2175 ml  Net -1838.5 ml    PHYSICAL EXAMINATION: General: 32 year old aaf, resting in bed. No acute distress.  HENT NCAT no JVD MMM pulm CTA no accessory use  Card RRR  abd not tender + bowel sounds Ext brisk CR + pulses  Neuro  intact   Recent Labs  Lab 11/29/17 1514 11/29/17 2151 11/30/17 0354 12/01/17 0338  NA 143  --  141 140  K 4.2  --  3.5 3.7  CL 114*  --  112* 111  CO2 20*  --  19* 18*  BUN 50*  --  52* 47*  CREATININE 6.13* 6.15* 5.90* 5.69*  GLUCOSE 89  --  93 98   Recent Labs  Lab 11/29/17 1514 11/30/17 0354 12/01/17 0338  HGB 10.5* 10.5* 10.3*  HCT 31.9* 31.7* 31.4*  WBC 4.3 5.4 5.0  PLT 175 246 266   Mr Mra Chest Wo Contrast  Result Date: 11/29/2017 CLINICAL DATA:  History prior thoracic aortic dissection with repair of ascending thoracic aortic dissection and 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 Now with chest pain, hypertension and edema involving the left side of the body. Iodinated contrast and gadolinium cannot be administered due to significant renal failure. EXAM: MRA CHEST WITH OR WITHOUT CONTRAST; MRA ABDOMEN WITH OR WITHOUT CONTRAST TECHNIQUE: Angiographic images of the chest were obtained using MRA technique without intravenous contrast. CONTRAST:  None COMPARISON:  10/20/2017 FINDINGS: VASCULAR Aorta: Stable appearance of the thoracic aorta with prior repair of the ascending thoracic aorta. Residual chronic dissection which begins in the distal arch has a stable appearance. Maximum of the distal arch and proximal descending thoracic aorta is 4.3 cm and is stable. Intimal flap extends again to the level of the mid descending thoracic aorta. No evidence of significant enlargement of the false  lumen or acute hemorrhage. Heart: Stable cardiac enlargement and evidence of left ventricular hypertrophy. Trace pericardial fluid. Pulmonary Arteries:  Normal caliber central pulmonary arteries. Other: The abdominal aorta and its branches remain unremarkable without evidence of aneurysm or dissection. NON-VASCULAR There is some interval enlargement in size of a right pleural effusion which remains still of fairly small volume. There is a very small left pleural  effusion. Small amount of ascites in the peritoneal cavity shows similar appearance to the prior study. IMPRESSION: VASCULAR Stable appearance of residual chronic dissection involving the distal aortic arch and descending thoracic aorta. No significant increased caliber of the descending aorta which measures 4.3 cm in maximum diameter. No evidence of acute hemorrhage. Dissection again terminates at the level of the mid descending thoracic aorta and does not extend into the abdomen. NON-VASCULAR Some interval enlargement in size of a right pleural effusion. There is a small left pleural effusion. Small amount of ascites in the peritoneal cavity shows similar appearance to the prior study. Electronically Signed   By: Aletta Edouard M.D.   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 Now with chest pain, hypertension and edema involving the left side of the body. Iodinated contrast and gadolinium cannot be administered due to significant renal failure. EXAM: MRA CHEST WITH OR WITHOUT CONTRAST; MRA ABDOMEN WITH OR WITHOUT CONTRAST TECHNIQUE: Angiographic images of the chest were obtained using MRA technique without intravenous contrast. CONTRAST:  None COMPARISON:  10/20/2017 FINDINGS: VASCULAR Aorta: Stable appearance of the thoracic aorta with prior repair of the ascending thoracic aorta. Residual chronic dissection which begins in the distal arch has a stable appearance. Maximum of the distal arch and proximal descending thoracic aorta is 4.3 cm and is stable. Intimal flap extends again to the level of the mid descending thoracic aorta. No evidence of significant enlargement of the false lumen or acute hemorrhage. Heart: Stable cardiac enlargement and evidence of left ventricular hypertrophy. Trace  pericardial fluid. Pulmonary Arteries:  Normal caliber central pulmonary arteries. Other: The abdominal aorta and its branches remain unremarkable without evidence of aneurysm or dissection. NON-VASCULAR There is some interval enlargement in size of a right pleural effusion which remains still of fairly small volume. There is a very small left pleural effusion. Small amount of ascites in the peritoneal cavity shows similar appearance to the prior study. IMPRESSION: VASCULAR Stable appearance of residual chronic dissection involving the distal aortic arch and descending thoracic aorta. No significant increased caliber of the descending aorta which measures 4.3 cm in maximum diameter. No evidence of acute hemorrhage. Dissection again terminates at the level of the mid descending thoracic aorta and does not extend into the abdomen. NON-VASCULAR Some interval enlargement in size of a right pleural effusion. There is a small left pleural effusion. Small amount of ascites in the peritoneal cavity shows similar appearance to the prior study. Electronically Signed   By: Aletta Edouard M.D.   On: 11/29/2017 17:54    ASSESSMENT / PLAN:  1. HTN Emergency 2. HF( possibly combined systolic and diastolic dysfunction) 3. Bilateral pleural effusion ( right >left) 4. Acute on Chronic Kidney Injury  Last ECHO was in April 2019 and showed Severe hypertensive cardiomyopathy/LVH. Mildly depressed LVEF,possibly due to severe afterload increase.  Discussion Now off cardene Still adjusting antihypertensives No distress Has been  seen by nephrology  Renal fxn slowly improving.   Plan Cont to adjust antihypertensives Will need social work consult to assist w/ meds and we should make every effort to ensure medications on 4$ list to help w/ compliance  I think it is safe to move her out of the ICU for further BP medication titration. We will sign off.    DVT prophylaxis: [place] heparin SUP: NA  Diet: no added salt   Activity: BR Disposition :   Erick Colace ACNP-BC [place] Pager # [phone] OR # [phone] if no answer   12/01/2017, 11:10 AM

## 2017-12-01 NOTE — Progress Notes (Signed)
Admit: 11/29/2017 LOS: 2  81F AoCKD4, HTN emergency, nonadherence, cocaine use  Subjective:  . Weened of nicardipine gtt . BP running high . Good UOP . Downtrending SCr, K ok  . Feels good, says swelling is less . No vision chabnges, HA, back pain, chest pain, abd pain  07/17 0701 - 07/18 0700 In: 336.5 [P.O.:120; I.V.:216.5] Out: 2325 [Urine:2325]  Filed Weights   11/29/17 2338 11/30/17 0444 12/01/17 0500  Weight: 63.3 kg (139 lb 8.8 oz) 63.3 kg (139 lb 8.8 oz) 64.4 kg (141 lb 15.6 oz)    Scheduled Meds: . amLODipine  10 mg Oral Daily  . carvedilol  25 mg Oral BID WC  . cloNIDine  0.2 mg Oral TID  . heparin  5,000 Units Subcutaneous Q8H  . hydrALAZINE  100 mg Oral TID   Continuous Infusions: . sodium chloride    . furosemide     PRN Meds:.sodium chloride, acetaminophen, hydrALAZINE  Current Labs: reviewed    Physical Exam:  Blood pressure (!) 186/101, pulse 78, temperature 97.6 F (36.4 C), temperature source Oral, resp. rate 12, height 5\' 3"  (1.6 m), weight 64.4 kg (141 lb 15.6 oz), SpO2 99 %. GEN: No acute distress, lying in hospital bed ENT: NCAT EYES: EOMI CV: RRR, normal S1-S2, no rub PULM: Clear bilaterally, normal work of breathing ABD: Soft, nontender, nondistended SKIN: No rashes or lesions EXT: Trace peripheral edema in the legs and in LUE    A 1. 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 HTN, emergency; nonadherent to outpt medications; uncontrolled 3. Cocaine use 4. Hx/o thoracic aortic aneurysm repaor 5. 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 ?hx/o TTP  P . Cont carvedilol, amlodipine, hydralazine . Add clonidine 0.2mg  PO TID . Daily weights, Daily Renal Panel, Strict I/Os, Avoid nephrotoxins (NSAIDs, judicious IV Contrast)  . Medication Issues; o Preferred narcotic agents for pain control are hydromorphone, fentanyl, and methadone. Morphine should not be used.  o Baclofen should be avoided o Avoid oral  sodium phosphate and magnesium citrate based laxatives / bowel preps    Pearson Grippe MD 12/01/2017, 11:08 AM  Recent Labs  Lab 11/29/17 1514 11/29/17 2151 11/30/17 0354 12/01/17 0338  NA 143  --  141 140  K 4.2  --  3.5 3.7  CL 114*  --  112* 111  CO2 20*  --  19* 18*  GLUCOSE 89  --  93 98  BUN 50*  --  52* 47*  CREATININE 6.13* 6.15* 5.90* 5.69*  CALCIUM 7.8*  --  7.8* 8.2*   Recent Labs  Lab 11/29/17 1514 11/30/17 0354 12/01/17 0338  WBC 4.3 5.4 5.0  NEUTROABS 2.9  --  3.0  HGB 10.5* 10.5* 10.3*  HCT 31.9* 31.7* 31.4*  MCV 91.7 90.6 90.0  PLT 175 246 266

## 2017-12-01 NOTE — Progress Notes (Signed)
Around 2am, BP increased to 192/94. Patient was asymptomatic. Paged Baltazar Najjar. New order for IV Hydralazine 5mg  for SPB >180 and Cardene d/c'd. Admininstered hydralazine with effectiveness. Continue to monitor.

## 2017-12-02 ENCOUNTER — Other Ambulatory Visit: Payer: Self-pay | Admitting: Internal Medicine

## 2017-12-02 LAB — RENAL FUNCTION PANEL
ANION GAP: 11 (ref 5–15)
Albumin: 3.1 g/dL — ABNORMAL LOW (ref 3.5–5.0)
BUN: 44 mg/dL — ABNORMAL HIGH (ref 6–20)
CHLORIDE: 109 mmol/L (ref 98–111)
CO2: 20 mmol/L — ABNORMAL LOW (ref 22–32)
Calcium: 8.6 mg/dL — ABNORMAL LOW (ref 8.9–10.3)
Creatinine, Ser: 6.01 mg/dL — ABNORMAL HIGH (ref 0.44–1.00)
GFR calc Af Amer: 10 mL/min — ABNORMAL LOW (ref >60)
GFR calc non Af Amer: 9 mL/min — ABNORMAL LOW (ref >60)
GLUCOSE: 92 mg/dL (ref 70–99)
POTASSIUM: 3.9 mmol/L (ref 3.5–5.1)
Phosphorus: 5 mg/dL — ABNORMAL HIGH (ref 2.5–4.6)
Sodium: 140 mmol/L (ref 135–145)

## 2017-12-02 NOTE — Progress Notes (Signed)
[facility] Progress Note  Patient: Valerie Mathis GYJ:[id number]   PCP: Patient, No Pcp Per DOB: [birth year]   DOA: 11/29/2017   DOS: 12/02/2017   Date of Service: the patient was seen and examined on 12/02/2017  Subjective: better blood pressure, no acute complains  Brief hospital course: Pt. with PMH of type B aortic dissection S/P graft repair, chronic combined CHF, chronic kidney disease stage IV, TTP, HTN, substance abuse; admitted on 11/29/2017, presented with complaint of swelling and weakness, was found to have hypertensive emergency secondary to noncompliance with medication and cocaine use. Currently further plan is monitor renal function .  Assessment and Plan: Hypertensive Emergency Cocaine abuse Started on nicardipine infusion.  Currently off of them. Restart home bp medications ICU recommended admission under [facility]. Blood pressure is well controlled for now monitor. Appreciate nephrology input.  Hypertension uncontrolled, see above Cont amlodipine 10mg  po qday Cont Hydralazine 100mg  po tid Decrease Carvedilol to 25mg  po bid Patient was on clonidine in the past which has been discontinued as patient's poor compliance can lead to rebound hypertension. Patient was counseled for quit using cocaine Clonidine resumed as well.  Monitor.  Acute kidney injury on CKD stage 4 (baseline creatinine 4) Discontinue bicarb. Nephrology consulted, appreciate input.  Started on IV Lasix. Now stopped,  Renal function mildly worsened, monitor  Troponin elevation, likely demand ischemia Troponins are unremarkable. Echocardiogram ordered.  H/o CHF Cont hydralazine dose increased by nephrology. Decrease Carvedilol 50mg  po bid (this above the max  FDA recommended dose)=> 25mg  po bid IV Lasix started by nephrology. Echocardiogram ordered  UTI Urine culture Rocephin 1gm iv qday, total 3-day treatment.  Diet: Renal diet DVT Prophylaxis: subcutaneous Heparin  Advance goals of  care discussion: full code  Family Communication: no family was present at bedside, at the time of interview.   Disposition:  Discharge to home.  Consultants: PCCM nephrology Procedures: Echocardiogram   Antibiotics: Anti-infectives (From admission, onward)   Start     Dose/Rate Route Frequency Ordered Stop   11/29/17 2130  cefTRIAXone (ROCEPHIN) 1 g in sodium chloride 0.9 % 100 mL IVPB  Status:  Discontinued     1 g 200 mL/hr over 30 Minutes Intravenous Every 24 hours 11/29/17 2106 11/30/17 1456       Objective: Physical Exam: Vitals:   12/01/17 1742 12/01/17 2046 12/02/17 0551 12/02/17 1442  BP: 139/72 138/79 (!) 149/91 139/84  Pulse: 69 67 68 62  Resp: 16 20 16 18   Temp: 97.6 F (36.4 C) 98 F (36.7 C) 98.2 F (36.8 C) 97.7 F (36.5 C)  TempSrc: Oral  Oral   SpO2: 100% 100% 100% 99%  Weight: 66.5 kg (146 lb 9.7 oz)  63.5 kg (140 lb)   Height: 5\' 3"  (1.6 m)       Intake/Output Summary (Last 24 hours) at 12/02/2017 1650 Last data filed at 12/02/2017 1645 Gross per 24 hour  Intake 542 ml  Output 2050 ml  Net -1508 ml   Filed Weights   12/01/17 0500 12/01/17 1742 12/02/17 0551  Weight: 64.4 kg (141 lb 15.6 oz) 66.5 kg (146 lb 9.7 oz) 63.5 kg (140 lb)   General: Alert, Awake and Oriented to Time, Place and Person. Appear in mild distress, affect appropriateEyes: PERRL, Conjunctiva normal ENT: Oral Mucosa clear moist Neck: no JVD, no Abnormal Mass Or lumps Cardiovascular: S1 and S2 Present, no Murmur, Peripheral Pulses Present Respiratory: normal respiratory effort, Bilateral Air entry equal and Decreased, no use of accessory muscle, Clear to  Auscultation, no Crackles, no wheezes Abdomen: Bowel Sound present, Soft and no tenderness, no hernia Skin: no redness, no Rash, no induration Extremities: no Pedal edema, no calf tenderness Neurologic: Grossly no focal neuro deficit. Bilaterally Equal motor strength  Data Reviewed: CBC: Recent Labs  Lab 11/29/17 1514  11/30/17 0354 12/01/17 0338  WBC 4.3 5.4 5.0  NEUTROABS 2.9  --  3.0  HGB 10.5* 10.5* 10.3*  HCT 31.9* 31.7* 31.4*  MCV 91.7 90.6 90.0  PLT 175 246 716   Basic Metabolic Panel: Recent Labs  Lab 11/29/17 1514 11/29/17 2151 11/30/17 0354 12/01/17 0338 12/02/17 0821  NA 143  --  141 140 140  K 4.2  --  3.5 3.7 3.9  CL 114*  --  112* 111 109  CO2 20*  --  19* 18* 20*  GLUCOSE 89  --  93 98 92  BUN 50*  --  52* 47* 44*  CREATININE 6.13* 6.15* 5.90* 5.69* 6.01*  CALCIUM 7.8*  --  7.8* 8.2* 8.6*  MG  --   --   --  2.3  --   PHOS  --   --   --   --  5.0*    Liver Function Tests: Recent Labs  Lab 11/29/17 1514 11/30/17 0354 12/01/17 0338 12/02/17 0821  AST 14* 9* 9*  --   ALT 15 13 13   --   ALKPHOS 131* 129* 120  --   BILITOT 0.5 0.6 0.8  --   PROT 6.2* 6.1* 6.1*  --   ALBUMIN 3.2* 3.1* 3.1* 3.1*   Recent Labs  Lab 11/29/17 1514  LIPASE 25   No results for input(s): AMMONIA in the last 168 hours. Coagulation Profile: Recent Labs  Lab 11/29/17 1514  INR 1.19   Cardiac Enzymes: Recent Labs  Lab 11/29/17 1514 11/29/17 2151 11/30/17 0354 11/30/17 1042  TROPONINI 0.03* 0.03* 0.04* 0.04*   BNP (last 3 results) No results for input(s): PROBNP in the last 8760 hours. CBG: No results for input(s): GLUCAP in the last 168 hours. Studies: No results found.  Scheduled Meds: . amLODipine  10 mg Oral Daily  . carvedilol  25 mg Oral BID WC  . cloNIDine  0.2 mg Oral TID  . heparin  5,000 Units Subcutaneous Q8H  . hydrALAZINE  100 mg Oral TID   Continuous Infusions: . sodium chloride 250 mL (12/01/17 2235)   PRN Meds: sodium chloride, acetaminophen, hydrALAZINE  Time spent: 35 minutes  Author: Berle Mull, MD [facility] Pager: [phone] 12/02/2017 4:50 PM  If 7PM-7AM, please contact night-coverage at [email or web address], password [facility]

## 2017-12-02 NOTE — Clinical Social Work Note (Addendum)
Clinical Social Work Assessment  Patient Details  Name: Valerie Mathis MRN: [id number] Date of Birth: [birth year]  Date of referral:  12/02/17               Reason for consult:  Substance Use/ETOH Abuse                Permission sought to share information with:    Permission granted to share information::  No  Name::        Agency::     Relationship::     Contact Information:     Housing/Transportation Living arrangements for the past 2 months:  Single Family Home Source of Information:  Patient Patient Interpreter Needed:  None Criminal Activity/Legal Involvement Pertinent to Current Situation/Hospitalization:  No - Comment as needed Significant Relationships:  Siblings, Friend Lives with:  Siblings Do you feel safe going back to the place where you live?  Yes Need for family participation in patient care:  No (Coment)  Care giving concerns:  No care giving concerns at the time of assessment.   Social Worker assessment / plan:  LCSW consulted for substance abuse.  Patient admitted for acute renal failure. Patient positive for cocaine and opiates on admission.   LCSW met at bedside with patient. No supports present. LCSW explained role and reason for visit.   Patient reports that she lives with her sister and is currently unemployed. Patient does not have insurance, but plans to speak with financial counseling before she is dc'd. Patient reports that she has two minor children ages 15 and 51 that live outside the home.   Patient reports that cocaine is her drug of choice and that she uses every day. Patient reports that she snorts cocaine. She states that she has been using for 7 years.  LCSW provided resources and explained resources available to patients without insurance.    PLAN: Patient agreeable to resources and will follow up on resources at dc.   Employment status:  Unemployed Forensic scientist:  Self Pay (Medicaid Pending) PT Recommendations:  Not assessed at  this time Information / Referral to community resources:     Patient/Family's Response to care: Patient is thankful for LCSW visit and SA resources.   Patient/Family's Understanding of and Emotional Response to Diagnosis, Current Treatment, and Prognosis:  Patient is realistic about her current SA. Patient is open and honest about her use. Patient contributes her use to " hanging around the wrong crowd and wanting to fit in. " Patient states " I'm going to get help. I can't keep doing this. I'm tired of coming in and out the hospital."   Emotional Assessment Appearance:  Appears stated age Attitude/Demeanor/Rapport:    Affect (typically observed):  Accepting, Calm, Pleasant Orientation:  Oriented to Self, Oriented to  Time, Oriented to Situation, Oriented to Place Alcohol / Substance use:  Illicit Drugs Psych involvement (Current and /or in the community):  No (Comment)  Discharge Needs  Concerns to be addressed:  Financial / Insurance Concerns Readmission within the last 30 days:  No Current discharge risk:  Substance Abuse Barriers to Discharge:  Continued Medical Work up   [employer]erie [employer], LCSW 12/02/2017, 10:49 AM

## 2017-12-02 NOTE — Care Management Note (Signed)
Case Management Note  Patient Details  Name: Valerie Mathis MRN: [id number] Date of Birth: 07/23/1985  Subjective/Objective:31 y/o f admitted w/htn urgency. From home. CM referral for pcp-provided patient w/pcp listing-encouraged [facility], also community resources-[facility], [employer] med list. Development worker, community already following. No further CM needs.                   Action/Plan:d/c home.   Expected Discharge Date:  (unknown)               Expected Discharge Plan:  Home/Self Care  In-House Referral:     Discharge planning Services  CM Consult, [facility]  Post Acute Care Choice:    Choice offered to:     DME Arranged:    DME Agency:     HH Arranged:    HH Agency:     Status of Service:  In process, will continue to follow  If discussed at Long Length of Stay Meetings, dates discussed:    Additional Comments:  Dessa Phi, RN 12/02/2017, 11:44 AM

## 2017-12-02 NOTE — Progress Notes (Signed)
Admit: 11/29/2017 LOS: 3  8F AoCKD4, HTN emergency, nonadherence, cocaine use  Subjective:  Marland Kitchen BPs stable on PO meds . Says plans to seek outpt substance use counseling . Renal function stable for now . Wants to go home   07/18 0701 - 07/19 0700 In: 62 [IV Piggyback:62] Out: 2525 [Urine:2525]  Filed Weights   12/01/17 0500 12/01/17 1742 12/02/17 0551  Weight: 64.4 kg (141 lb 15.6 oz) 66.5 kg (146 lb 9.7 oz) 63.5 kg (140 lb)    Scheduled Meds: . amLODipine  10 mg Oral Daily  . carvedilol  25 mg Oral BID WC  . cloNIDine  0.2 mg Oral TID  . heparin  5,000 Units Subcutaneous Q8H  . hydrALAZINE  100 mg Oral TID   Continuous Infusions: . sodium chloride 250 mL (12/01/17 2235)   PRN Meds:.sodium chloride, acetaminophen, hydrALAZINE  Current Labs: reviewed    Physical Exam:  Blood pressure (!) 149/91, pulse 68, temperature 98.2 F (36.8 C), temperature source Oral, resp. rate 16, height 5\' 3"  (1.6 m), weight 63.5 kg (140 lb), SpO2 100 %. GEN: No acute distress, lying in hospital bed ENT: NCAT EYES: EOMI CV: RRR, normal S1-S2, no rub PULM: Clear bilaterally, normal work of breathing ABD: Soft, nontender, nondistended SKIN: No rashes or lesions EXT: Edema improved the legs and in LUE   A 1. 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 HTN, emergency; nonadherent to outpt medications; improved control 3. Cocaine use 4. Hx/o thoracic aortic aneurysm repaor 5. 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 ?hx/o TTP  P . Cont carvedilol, amlodipine, hydralazine, clonidine . Would also give lasix 80 PO qAM at DC . FU with me 7/29 at 2:15pm with me at [facility] . OK with discharge  Pearson Grippe MD 12/02/2017, 1:16 PM  Recent Labs  Lab 11/30/17 0354 12/01/17 0338 12/02/17 0821  NA 141 140 140  K 3.5 3.7 3.9  CL 112* 111 109  CO2 19* 18* 20*  GLUCOSE 93 98 92  BUN 52* 47* 44*  CREATININE 5.90* 5.69* 6.01*  CALCIUM 7.8* 8.2* 8.6*   PHOS  --   --  5.0*   Recent Labs  Lab 11/29/17 1514 11/30/17 0354 12/01/17 0338  WBC 4.3 5.4 5.0  NEUTROABS 2.9  --  3.0  HGB 10.5* 10.5* 10.3*  HCT 31.9* 31.7* 31.4*  MCV 91.7 90.6 90.0  PLT 175 246 266

## 2017-12-03 LAB — RENAL FUNCTION PANEL
ALBUMIN: 3 g/dL — AB (ref 3.5–5.0)
Anion gap: 10 (ref 5–15)
BUN: 45 mg/dL — AB (ref 6–20)
CHLORIDE: 110 mmol/L (ref 98–111)
CO2: 21 mmol/L — ABNORMAL LOW (ref 22–32)
CREATININE: 5.92 mg/dL — AB (ref 0.44–1.00)
Calcium: 8.3 mg/dL — ABNORMAL LOW (ref 8.9–10.3)
GFR calc Af Amer: 10 mL/min — ABNORMAL LOW (ref >60)
GFR calc non Af Amer: 9 mL/min — ABNORMAL LOW (ref >60)
GLUCOSE: 96 mg/dL (ref 70–99)
PHOSPHORUS: 4.8 mg/dL — AB (ref 2.5–4.6)
POTASSIUM: 4 mmol/L (ref 3.5–5.1)
Sodium: 141 mmol/L (ref 135–145)

## 2017-12-03 MED ORDER — FUROSEMIDE 40 MG PO TABS
80.0000 mg | ORAL_TABLET | Freq: Two times a day (BID) | ORAL | Status: DC
  Administered 2017-12-03: 80 mg via ORAL
  Filled 2017-12-03: qty 2

## 2017-12-03 MED ORDER — CALCITRIOL 0.25 MCG PO CAPS: 0.2500 ug | ORAL_CAPSULE | Freq: Every day | ORAL | 0 refills | Status: DC

## 2017-12-03 MED ORDER — CARVEDILOL 25 MG PO TABS: 25.0000 mg | ORAL_TABLET | Freq: Two times a day (BID) | ORAL | 0 refills | Status: DC

## 2017-12-03 MED ORDER — FUROSEMIDE 80 MG PO TABS: 80.0000 mg | ORAL_TABLET | Freq: Two times a day (BID) | ORAL | 0 refills | Status: DC

## 2017-12-03 MED ORDER — HYDRALAZINE HCL 100 MG PO TABS: 100.0000 mg | ORAL_TABLET | Freq: Three times a day (TID) | ORAL | 0 refills | Status: DC

## 2017-12-03 MED ORDER — AMLODIPINE BESYLATE 10 MG PO TABS: 10.0000 mg | ORAL_TABLET | Freq: Every day | ORAL | 0 refills | Status: DC

## 2017-12-03 MED ORDER — CLONIDINE HCL 0.2 MG PO TABS: 0.2000 mg | ORAL_TABLET | Freq: Three times a day (TID) | ORAL | 0 refills | Status: DC

## 2017-12-03 NOTE — Discharge Instructions (Signed)
Stimulant Use Disorder-Cocaine Stimulant use disorder is when your stimulant use disrupts your daily life. It may disrupt your relationships and how you do your job. Stimulant use disorder can be dangerous. Cocaine increases your blood pressure and heart rate. Using it can lead to a heart attack or stroke. Cocaine can also make your heart rate irregular and cause seizures. These problems can lead to death.  How is this treated? Treatment for this condition is usually provided by mental health professionals with training in substance use disorders. Treatment may involve:  Counseling. This treatment is also called talk therapy. It is provided by substance use treatment counselors. A counselor can address the reasons you use cocaine and suggest ways to keep you from using it again. The goals of talk therapy are to: ? Find healthy activities to replace using cocaine. ? Identify and avoid what triggers your cocaine use. ? Help you learn how to handle cravings.  Support groups. Support groups are run by people who have quit using stimulants. They provide emotional support, advice, and guidance.  Medicines.  Follow these instructions at home:  Take over-the-counter and prescription medicines only as told by your health care provider.  Check with your health care provider before starting any new medicines.  Do not use any products that contain nicotine or tobacco, such as cigarettes and e-cigarettes. If you need help quitting, ask your health care provider.  Keep all follow-up visits as told by your health care provider. This is important. Where to find more information:  [employer] on Drug Abuse: motorcyclefax.com  Substance Abuse and Mental Health Services Administration: ktimeonline.com Contact a health care provider if:  You are not able to take your medicines as told.  You use cocaine again.  Your symptoms get worse. Get help right away if:  You have serious thoughts about  hurting yourself or others.  You have a seizure.  You have chest pain.  You have sudden weakness.  You lose some of your vision.  You lose some of your speech. If you ever feel like you may hurt yourself or others, or have thoughts about taking your own life, get help right away. You can go to your nearest emergency department or call:  Your local emergency services (911 in the [place]  A suicide crisis helpline, such as the [place] at 367-831-7670. This is open 24 hours a day.  This information is not intended to replace advice given to you by your health care provider. Make sure you discuss any questions you have with your health care provider. Document Released: 04/30/2000 Document Revised: 02/13/2016 Document Reviewed: 02/13/2016 Elsevier Interactive Patient Education  2018 [employer]. Renal Diet  What do I need to know about this diet?  Limit potassium. Potassium is in milk, fruits, and vegetables.  Limit phosphorus. Phosphorus is in milk, cheese, beans, nuts, and carbonated beverages.  Limit salt (sodium). Foods that have a lot sodium include processed and cured meats, ready-made frozen meals, canned vegetables, and salty snack foods.  Do not use salt substitutes.  Try not to eat whole-grain foods and foods that have a lot of fiber.  Follow your doctor's instructions about how much to drink. You may be told to: ? Write down what you drink. ? Write down foods you eat that are made mostly from water, such as gelatin and soups. ? Drink from small cups.  Ask your doctor if you should take a medicine that binds phosphorus.  Take vitamin and mineral  supplements only as told by your doctor.  Eat meat, poultry, fish, and eggs. Limit nuts and beans.  Before you cook potatoes, cut them into small pieces. Then boil them in unsalted water.  Drain all fluid from cooked vegetables and canned fruits before you eat them. What foods can I  eat? Grains White bread. White rice. Cooked cereal. Unsalted popcorn. Tortillas. Pasta. Vegetables Fresh or frozen broccoli, carrots, and green beans. Cabbage. Cauliflower. Celery. Cucumbers. Eggplant. Radishes. Zucchini. Fruits Apples. Fresh or frozen berries. Fresh or canned pears, peaches, and pineapple. Grapes. Plums. Meats and Other Protein Sources Fresh or frozen beef, pork, chicken, and fish. Eggs. Low-sodium canned tuna or salmon. Dairy Cream cheese. Heavy cream. Ricotta cheese. Beverages Apple cider. Cranberry juice. Grape juice. Lemonade. Black coffee. Condiments Herbs. Spices. Jam and jelly. Honey. Sweets and Desserts Sherbet. Cakes. Cookies. Fats and Oils Olive oil, canola oil, and safflower oil. Other Non-dairy creamer. Non-dairy whipped topping. Homemade broth without salt. The items listed above may not be a complete list of recommended foods or beverages. Contact your dietitian for more options. What foods are not recommended? Grains Whole-grain bread. Whole-grain pasta. High-fiber cereal. Vegetables Potatoes. Beets. Tomatoes. Winter squash and pumpkin. Asparagus. Spinach. Parsnips. Fruits Star fruit. Bananas. Oranges. Kiwi. Nectarines. Prunes. Melon. Dried fruit. Avocado. Meats and Other Protein Sources Canned, smoked, and cured meats. Soil scientist. Sardines. Nuts and seeds. Peanut butter. Beans and legumes. Dairy Milk. Buttermilk. Yogurt. Cheese and cottage cheese. Processed cheese spreads. Beverages Orange juice. Prune juice. Carbonated soft drinks. Condiments Salt. Salt substitutes. Soy sauce. Sweets and Desserts Ice cream. Chocolate. Candied nuts. Fats and Oils Butter. Margarine. Other Ready-made frozen meals. Canned soups. The items listed above may not be a complete list of foods and beverages to avoid. Contact your dietitian for more information. This information is not intended to replace advice given to you by your health care provider.  Make sure you discuss any questions you have with your health care provider. Document Released: 11/02/2011 Document Revised: 10/09/2015 Document Reviewed: 12/04/2013 Elsevier Interactive Patient Education  2018 [employer]    Chronic Kidney Disease, Adult Chronic kidney disease (CKD) occurs when the kidneys become damaged slowly over a long period of time. The kidneys are a pair of organs that do many important jobs in the body, including:  Removing waste and extra fluid from the blood to make urine.  Making hormones that maintain the amount of fluid in tissues and blood vessels.  Maintaining the right amount of fluids and chemicals in the body.  A small amount of kidney damage may not cause problems, but a large amount of damage may make it hard or impossible for the kidneys to work the way they should. If steps are not taken to slow down kidney damage or to stop it from getting worse, the kidneys may stop working permanently (end-stage renal disease or ESRD). Most of the time, CKD does not go away, but it can often be controlled. People who have CKD are usually able to live normal lives. What are the causes? The most common causes of this condition are diabetes and high blood pressure (hypertension). Other causes include:  Heart and blood vessel (cardiovascular) disease.  Kidney diseases, such as: ? Glomerulonephritis. ? Interstitial nephritis. ? Polycystic kidney disease. ? Renal vascular disease.  Diseases that affect the immune system.  Genetic diseases.  Medicines that damage the kidneys, such as anti-inflammatory medicines.  Being around or being in contact with poisonous (toxic) substances.  A kidney  or urinary infection that occurs again and again (recurs).  Vasculitis. This is swelling or inflammation of the blood vessels.  A problem with urine flow that may be caused by: ? Cancer. ? Having kidney stones more than one time. ? An enlarged prostate, in  males.  What increases the risk? You are more likely to develop this condition if you:  Are older than age 35.  Are female.  Are African-American, Hispanic, Asian, [place], or American [place].  Are a current or former smoker.  Are obese.  Have a family history of kidney disease or failure.  Often take medicines that are damaging to the kidneys.  What are the signs or symptoms? Symptoms of this condition include:  Swelling (edema) of the face, legs, ankles, or feet.  Tiredness (lethargy) and having less energy.  Nausea or vomiting.  Confusion or trouble concentrating.  Problems with urination, such as: ? Painful or burning feeling during urination. ? Decreased urine production. ? Frequent urination, especially at night. ? Bloody urine.  Muscle twitches and cramps, especially in the legs.  Shortness of breath.  Weakness.  Loss of appetite.  Metallic taste in the mouth.  Trouble sleeping.  Dry, itchy skin.  A low blood count (anemia).  Pale lining of the eyelids and surface of the eye (conjunctiva).  Symptoms develop slowly and may not be obvious until the kidney damage becomes severe. It is possible to have kidney disease for years without having any symptoms. How is this diagnosed? This condition may be diagnosed based on:  Blood tests.  Urine tests.  Imaging tests, such as an ultrasound or CT scan.  A test in which a sample of tissue is removed from the kidneys to be examined under a microscope (kidney biopsy).  These test results will help your health care provider determine how serious the CKD is. How is this treated? There is no cure for most cases of this condition, but treatment usually relieves symptoms and prevents or slows the progression of the disease. Treatment may include:  Making diet changes, which may require you to avoid alcohol, salty foods (sodium), and foods that are high in potassium, calcium, and  protein.  Medicines: ? To lower blood pressure. ? To control blood glucose. ? To relieve anemia. ? To relieve swelling. ? To protect your bones. ? To improve the balance of electrolytes in your blood.  Removing toxic waste from the body through types of dialysis, if the kidneys can no longer do their job (kidney failure).  Managing any other conditions that are causing your CKD or making it worse.  Follow these instructions at home: Medicines  Take over-the-counter and prescription medicines only as told by your health care provider. The dose of some medicines that you take may need to be adjusted.  Do not take any new medicines unless approved by your health care provider. Many medicines can worsen your kidney damage.  Do not take any vitamin and mineral supplements unless approved by your health care provider. Many nutritional supplements can worsen your kidney damage. General instructions  Follow your prescribed diet as told by your health care provider.  Do not use any products that contain nicotine or tobacco, such as cigarettes and e-cigarettes. If you need help quitting, ask your health care provider.  Monitor and track your blood pressure at home. Report changes in your blood pressure as told by your health care provider.  If you are being treated for diabetes, monitor and track your blood  sugar (blood glucose) levels as told by your health care provider.  Maintain a healthy weight. If you need help with this, ask your health care provider.  Start or continue an exercise plan. Exercise at least 30 minutes a day, 5 days a week.  Keep your immunizations up to date as told by your health care provider.  Keep all follow-up visits as told by your health care provider. This is important. Where to find more information:  American Association of Kidney Patients: BombTimer.gl  National Kidney Foundation: www.kidney.[place]: https://mathis.com/  [facility]  [facility] Program: www.lifeoptions.org and www.kidneyschool.org Contact a health care provider if:  Your symptoms get worse.  You develop new symptoms. Get help right away if:  You develop symptoms of ESRD, which include: ? Headaches. ? Numbness in the hands or feet. ? Easy bruising. ? Frequent hiccups. ? Chest pain. ? Shortness of breath. ? Lack of menstruation, in women.  You have a fever.  You have decreased urine production.  You have pain or bleeding when you urinate. Summary  Chronic kidney disease (CKD) occurs when the kidneys become damaged slowly over a long period of time.  The most common causes of this condition are diabetes and high blood pressure (hypertension).  There is no cure for most cases of this condition, but treatment usually relieves symptoms and prevents or slows the progression of the disease. Treatment may include a combination of medicines and lifestyle changes. This information is not intended to replace advice given to you by your health care provider. Make sure you discuss any questions you have with your health care provider. Document Released: 02/10/2008 Document Revised: 06/10/2016 Document Reviewed: 06/10/2016 Elsevier Interactive Patient Education  [employer].

## 2017-12-03 NOTE — Discharge Summary (Signed)
[facility] Discharge Summary   Patient: Valerie Mathis VWU:[id number]   PCP: Patient, No Pcp Per DOB: 07-26-1985   Date of admission: 11/29/2017   Date of discharge:  12/03/2017    Discharge Diagnoses:  Principal Problem:   Hypertensive emergency Active Problems:   Chronic kidney disease Stage IV-V   Uncontrolled hypertension   ARF (acute renal failure) ([place])  Admitted From: home Disposition:  home  Recommendations for Outpatient Follow-up:  1. Please follow-up with PCP in 1 week 2. Please follow-up with nephrology as recommended  Follow-up Information    Rexene Agent, MD Follow up on 12/12/2017.   Specialty:  Nephrology Why:  at 2:15pm Contact information: [place] [address] [phone]        [facility] Follow up.   Specialty:  Family Medicine Why:  please call to schedule an appointment Contact information: [place] [address] [phone]         Diet recommendation: Renal diet  Activity: The patient is advised to gradually reintroduce usual activities.  Discharge Condition: good  Code Status: Full code  History of present illness: As per the H and P dictated on admission, "Valerie Mathis  is a 32 y.o. female, w h/o history of aortic dissection s/p repair ([place], [place]), CHF (EF ?), CKD stage 4  (baseline creatinine 4), TTP, hypertension who presents with sore throat this AM and thus didn't take her bp medications this Am. She then had some facial swelling and swelling of her left arm and leg, which she attributed to to cat exposure.  Pt states better now.    In Ed, found to be hypertensive and in ARF.  Swelling better now, and has normalized. "  Hospital Course:  Summary of her active problems in the hospital is as following. Hypertensive Emergency Cocaine abuse Started on nicardipine infusion.  Currently off of them. Restart home bp medications ICU recommended  admission under [facility]. Blood pressure is well controlled for now monitor. Appreciate nephrology input.  Hypertension uncontrolled, see above Cont amlodipine 10mg  po qday Cont Hydralazine 100mg  po tid Decrease Carvedilol to 25mg  po bid Patient was on clonidine in the past which has been discontinued as patient's poor compliance can lead to rebound hypertension.  Due to uncontrolled blood pressure clonidine was resumed. Patient was counseled for quit using cocaine and remain compliant with her blood pressure medication  Acute kidney injury on CKD stage 4 (baseline creatinine 4) Discontinue bicarb. Nephrology consulted, appreciate input.  Started on IV Lasix. Now stopped, transition to oral  Troponin elevation, likely demand ischemia Troponins are unremarkable. Echocardiogram ordered.  Acute on chronic diastolic CHF Noncompliance with medical regimen Cont hydralazine dose increased by nephrology. Decrease Carvedilol 50mg  po bid (this above the max FDA recommended dose)=>25mg  po bid IV Lasix started by nephrology.  Transition to oral Lasix on discharge Echocardiogram ordered shows preserved EF  UTI Urine culture no growth Rocephin 1gm iv qday, total 3-day treatment.   All other chronic medical condition were stable during the hospitalization.  Patient was ambulatory without any assistance. On the day of the discharge the patient's vitals were stable , and no other acute medical condition were reported by patient. the patient was felt safe to be discharge at home with family.  Consultants: PCCM, nephrology Procedures: Echocardiogram   DISCHARGE MEDICATION: Allergies as of 12/03/2017   No Known Allergies     Medication List    STOP taking these medications  sodium bicarbonate 650 MG tablet     TAKE these medications   amLODipine 10 MG tablet Commonly known as:  NORVASC Take 1 tablet (10 mg total) by mouth daily. for high blood pressure   calcitRIOL 0.25 MCG  capsule Commonly known as:  ROCALTROL Take 1 capsule (0.25 mcg total) by mouth daily.   carvedilol 25 MG tablet Commonly known as:  COREG Take 1 tablet (25 mg total) by mouth 2 (two) times daily with a meal. What changed:  how much to take   cloNIDine 0.2 MG tablet Commonly known as:  CATAPRES Take 1 tablet (0.2 mg total) by mouth 3 (three) times daily.   ferrous sulfate 325 (65 FE) MG tablet Take 1 tablet (325 mg total) by mouth 2 (two) times daily with a meal.   furosemide 80 MG tablet Commonly known as:  LASIX Take 1 tablet (80 mg total) by mouth 2 (two) times daily. What changed:    medication strength  how much to take  additional instructions   hydrALAZINE 100 MG tablet Commonly known as:  APRESOLINE Take 1 tablet (100 mg total) by mouth 3 (three) times daily. What changed:    medication strength  how much to take   polyethylene glycol packet Commonly known as:  MIRALAX Take 17 g by mouth daily as needed for moderate constipation or severe constipation.      No Known Allergies Discharge Instructions    Diet renal [id number]   Complete by:  As directed    Discharge instructions   Complete by:  As directed    It is important that you read following instructions as well as go over your medication list with RN to help you understand your care after this hospitalization.  Discharge Instructions: Please follow-up with PCP in one week  Please request your primary care physician to go over all Hospital Tests and Procedure/Radiological results at the follow up,  Please get all Hospital records sent to your PCP by signing hospital release before you go home.   Do not take more than prescribed Pain, Sleep and Anxiety Medications. You were cared for by a hospitalist during your hospital stay. If you have any questions about your discharge medications or the care you received while you were in the hospital after you are discharged, you can call the unit and ask  to speak with the hospitalist on call if the hospitalist that took care of you is not available.  Once you are discharged, your primary care physician will handle any further medical issues. Please note that NO REFILLS for any discharge medications will be authorized once you are discharged, as it is imperative that you return to your primary care physician (or establish a relationship with a primary care physician if you do not have one) for your aftercare needs so that they can reassess your need for medications and monitor your lab values. You Must read complete instructions/literature along with all the possible adverse reactions/side effects for all the Medicines you take and that have been prescribed to you. Take any new Medicines after you have completely understood and accept all the possible adverse reactions/side effects. Wear Seat belts while driving. If you have smoked or chewed Tobacco in the last 2 yrs please stop smoking and/or stop any Recreational drug use.   Increase activity slowly   Complete by:  As directed      Discharge Exam: Filed Weights   12/01/17 1742 12/02/17 0551 12/03/17 0516  Weight: 66.5 kg (146  lb 9.7 oz) 63.5 kg (140 lb) 60.8 kg (134 lb)   Vitals:   12/03/17 0945 12/03/17 1145  BP: (!) 169/86 (!) 156/93  Pulse: 60 (!) 59  Resp:  20  Temp:  98.2 F (36.8 C)  SpO2: 98% 100%   General: Appear in no distress, no Rash; Oral Mucosa moist. Cardiovascular: S1 and S2 Present, no Murmur, no JVD Respiratory: Bilateral Air entry present and Clear to Auscultation, no Crackles, no wheezes Abdomen: Bowel Sound present, Soft and no tenderness Extremities: no Pedal edema, no calf tenderness Neurology: Grossly no focal neuro deficit.  The results of significant diagnostics from this hospitalization (including imaging, microbiology, ancillary and laboratory) are listed below for reference.    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 Now with chest pain, hypertension and edema involving the left side of the body. Iodinated contrast and gadolinium cannot be administered due to significant renal failure. EXAM: MRA CHEST WITH OR WITHOUT CONTRAST; MRA ABDOMEN WITH OR WITHOUT CONTRAST TECHNIQUE: Angiographic images of the chest were obtained using MRA technique without intravenous contrast. CONTRAST:  None COMPARISON:  10/20/2017 FINDINGS: VASCULAR Aorta: Stable appearance of the thoracic aorta with prior repair of the ascending thoracic aorta. Residual chronic dissection which begins in the distal arch has a stable appearance. Maximum of the distal arch and proximal descending thoracic aorta is 4.3 cm and is stable. Intimal flap extends again to the level of the mid descending thoracic aorta. No evidence of significant enlargement of the false lumen or acute hemorrhage. Heart: Stable cardiac enlargement and evidence of left ventricular hypertrophy. Trace pericardial fluid. Pulmonary Arteries:  Normal caliber central pulmonary arteries. Other: The abdominal aorta and its branches remain unremarkable without evidence of aneurysm or dissection. NON-VASCULAR There is some interval enlargement in size of a right pleural effusion which remains still of fairly small volume. There is a very small left pleural effusion. Small amount of ascites in the peritoneal cavity shows similar appearance to the prior study. IMPRESSION: VASCULAR Stable appearance of residual chronic dissection involving the distal aortic arch and descending thoracic aorta. No significant increased caliber of the descending aorta which measures 4.3 cm in maximum diameter. No evidence of acute hemorrhage. Dissection again terminates at the level of the mid descending thoracic aorta and does not  extend into the abdomen. NON-VASCULAR Some interval enlargement in size of a right pleural effusion. There is a small left pleural effusion. Small amount of ascites in the peritoneal cavity shows similar appearance to the prior study. Electronically Signed   By: Aletta Edouard M.D.   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 Now with chest pain, hypertension and edema involving the left side of the body. Iodinated contrast and gadolinium cannot be administered due to significant renal failure. EXAM: MRA CHEST WITH OR WITHOUT CONTRAST; MRA ABDOMEN WITH OR WITHOUT CONTRAST TECHNIQUE: Angiographic images of the chest were obtained using MRA technique without intravenous contrast. CONTRAST:  None COMPARISON:  10/20/2017 FINDINGS: VASCULAR Aorta: Stable appearance of the thoracic aorta with prior repair of the ascending thoracic aorta. Residual chronic dissection which begins in the distal arch has a  stable appearance. Maximum of the distal arch and proximal descending thoracic aorta is 4.3 cm and is stable. Intimal flap extends again to the level of the mid descending thoracic aorta. No evidence of significant enlargement of the false lumen or acute hemorrhage. Heart: Stable cardiac enlargement and evidence of left ventricular hypertrophy. Trace pericardial fluid. Pulmonary Arteries:  Normal caliber central pulmonary arteries. Other: The abdominal aorta and its branches remain unremarkable without evidence of aneurysm or dissection. NON-VASCULAR There is some interval enlargement in size of a right pleural effusion which remains still of fairly small volume. There is a very small left pleural effusion. Small amount of ascites in the peritoneal cavity shows similar appearance to the prior study.  IMPRESSION: VASCULAR Stable appearance of residual chronic dissection involving the distal aortic arch and descending thoracic aorta. No significant increased caliber of the descending aorta which measures 4.3 cm in maximum diameter. No evidence of acute hemorrhage. Dissection again terminates at the level of the mid descending thoracic aorta and does not extend into the abdomen. NON-VASCULAR Some interval enlargement in size of a right pleural effusion. There is a small left pleural effusion. Small amount of ascites in the peritoneal cavity shows similar appearance to the prior study. Electronically Signed   By: Aletta Edouard M.D.   On: 11/29/2017 17:54    Microbiology: Recent Results (from the past 240 hour(s))  Culture, Urine     Status: Abnormal   Collection Time: 11/29/17  7:22 PM  Result Value Ref Range Status   Specimen Description   Final    URINE, RANDOM Performed at [facility], [place] [address] [place], [place] [address]    Special Requests   Final    NONE Performed at [facility], [place] [address], [place], [place] [address]    Culture (A)  Final    70,000 COLONIES/mL GROUP B STREP(S.AGALACTIAE)ISOLATED TESTING AGAINST S. AGALACTIAE NOT ROUTINELY PERFORMED DUE TO PREDICTABILITY OF AMP/PEN/VAN SUSCEPTIBILITY. Performed at [facility], [place] [address] [place], [place] [address]    Report Status 12/01/2017 FINAL  Final  MRSA PCR Screening     Status: None   Collection Time: 11/29/17 11:03 PM  Result Value Ref Range Status   MRSA by PCR NEGATIVE NEGATIVE Final    Comment:        The GeneXpert MRSA Assay (FDA approved for NASAL specimens only), is one component of a comprehensive MRSA colonization surveillance program. It is not intended to diagnose MRSA infection nor to guide or monitor treatment for MRSA infections. Performed at [facility], [place] [address], [place], [place] [address]       Labs: CBC: Recent Labs  Lab 11/29/17 1514 11/30/17 0354 12/01/17 0338  WBC 4.3 5.4 5.0  NEUTROABS 2.9  --  3.0  HGB 10.5* 10.5* 10.3*  HCT 31.9* 31.7* 31.4*  MCV 91.7 90.6 90.0  PLT 175 246 433   Basic Metabolic Panel: Recent Labs  Lab 11/29/17 1514 11/29/17 2151 11/30/17 0354 12/01/17 0338 12/02/17 0821 12/03/17 0513  NA 143  --  141 140 140 141  K 4.2  --  3.5 3.7 3.9 4.0  CL 114*  --  112* 111 109 110  CO2 20*  --  19* 18* 20* 21*  GLUCOSE 89  --  93 98 92 96  BUN 50*  --  52* 47* 44* 45*  CREATININE 6.13* 6.15* 5.90* 5.69* 6.01* 5.92*  CALCIUM 7.8*  --  7.8* 8.2* 8.6*  8.3*  MG  --   --   --  2.3  --   --   PHOS  --   --   --   --  5.0* 4.8*   Liver Function Tests: Recent Labs  Lab 11/29/17 1514 11/30/17 0354 12/01/17 0338 12/02/17 0821 12/03/17 0513  AST 14* 9* 9*  --   --   ALT 15 13 13   --   --   ALKPHOS 131* 129* 120  --   --   BILITOT 0.5 0.6 0.8  --   --   PROT 6.2* 6.1* 6.1*  --   --   ALBUMIN 3.2* 3.1* 3.1* 3.1* 3.0*   Recent Labs  Lab 11/29/17 1514  LIPASE 25   No results for input(s): AMMONIA in the last 168 hours. Cardiac Enzymes: Recent Labs  Lab 11/29/17 1514 11/29/17 2151 11/30/17 0354 11/30/17 1042  TROPONINI 0.03* 0.03* 0.04* 0.04*   BNP (last 3 results) Recent Labs    09/12/17 1531  BNP >4,500.0*   CBG: No results for input(s): GLUCAP in the last 168 hours. Time spent: 35 minutes  Signed:  Berle Mull  [facility]  12/03/2017  , 12:33 PM

## 2017-12-11 ENCOUNTER — Other Ambulatory Visit: Payer: Self-pay | Admitting: Internal Medicine

## 2017-12-17 ENCOUNTER — Encounter (HOSPITAL_COMMUNITY): Payer: Self-pay

## 2017-12-17 ENCOUNTER — Inpatient Hospital Stay (HOSPITAL_COMMUNITY)
Admission: EM | Admit: 2017-12-17 | Discharge: 2017-12-24 | DRG: 264 | Disposition: A | Discharge status: Home / Self Care | Payer: Medicaid Other | Attending: Family Medicine | Admitting: Family Medicine

## 2017-12-17 ENCOUNTER — Emergency Department (HOSPITAL_COMMUNITY): Payer: Medicaid Other

## 2017-12-17 ENCOUNTER — Other Ambulatory Visit: Payer: Self-pay

## 2017-12-17 DIAGNOSIS — E663 Overweight: Secondary | ICD-10-CM | POA: Diagnosis present

## 2017-12-17 DIAGNOSIS — N189 Chronic kidney disease, unspecified: Secondary | ICD-10-CM | POA: Diagnosis present

## 2017-12-17 DIAGNOSIS — Z7151 Drug abuse counseling and surveillance of drug abuser: Secondary | ICD-10-CM | POA: Diagnosis not present

## 2017-12-17 DIAGNOSIS — I5042 Chronic combined systolic (congestive) and diastolic (congestive) heart failure: Secondary | ICD-10-CM | POA: Diagnosis present

## 2017-12-17 DIAGNOSIS — Z6823 Body mass index (BMI) 23.0-23.9, adult: Secondary | ICD-10-CM

## 2017-12-17 DIAGNOSIS — F17210 Nicotine dependence, cigarettes, uncomplicated: Secondary | ICD-10-CM | POA: Diagnosis present

## 2017-12-17 DIAGNOSIS — Z9889 Other specified postprocedural states: Secondary | ICD-10-CM | POA: Diagnosis not present

## 2017-12-17 DIAGNOSIS — D631 Anemia in chronic kidney disease: Secondary | ICD-10-CM | POA: Diagnosis present

## 2017-12-17 DIAGNOSIS — I71019 Dissection of thoracic aorta, unspecified: Secondary | ICD-10-CM | POA: Diagnosis present

## 2017-12-17 DIAGNOSIS — I161 Hypertensive emergency: Secondary | ICD-10-CM | POA: Diagnosis present

## 2017-12-17 DIAGNOSIS — Z86718 Personal history of other venous thrombosis and embolism: Secondary | ICD-10-CM

## 2017-12-17 DIAGNOSIS — N179 Acute kidney failure, unspecified: Secondary | ICD-10-CM | POA: Diagnosis present

## 2017-12-17 DIAGNOSIS — F1490 Cocaine use, unspecified, uncomplicated: Secondary | ICD-10-CM | POA: Diagnosis present

## 2017-12-17 DIAGNOSIS — Z9114 Patient's other noncompliance with medication regimen: Secondary | ICD-10-CM | POA: Diagnosis not present

## 2017-12-17 DIAGNOSIS — I7101 Dissection of thoracic aorta: Secondary | ICD-10-CM

## 2017-12-17 DIAGNOSIS — Z79899 Other long term (current) drug therapy: Secondary | ICD-10-CM | POA: Diagnosis not present

## 2017-12-17 DIAGNOSIS — F1410 Cocaine abuse, uncomplicated: Secondary | ICD-10-CM | POA: Diagnosis present

## 2017-12-17 DIAGNOSIS — N185 Chronic kidney disease, stage 5: Secondary | ICD-10-CM | POA: Diagnosis present

## 2017-12-17 DIAGNOSIS — I10 Essential (primary) hypertension: Secondary | ICD-10-CM | POA: Diagnosis present

## 2017-12-17 DIAGNOSIS — E875 Hyperkalemia: Secondary | ICD-10-CM | POA: Diagnosis present

## 2017-12-17 DIAGNOSIS — R0602 Shortness of breath: Secondary | ICD-10-CM

## 2017-12-17 DIAGNOSIS — I43 Cardiomyopathy in diseases classified elsewhere: Secondary | ICD-10-CM | POA: Diagnosis present

## 2017-12-17 DIAGNOSIS — I132 Hypertensive heart and chronic kidney disease with heart failure and with stage 5 chronic kidney disease, or end stage renal disease: Secondary | ICD-10-CM | POA: Diagnosis present

## 2017-12-17 DIAGNOSIS — I119 Hypertensive heart disease without heart failure: Secondary | ICD-10-CM | POA: Diagnosis present

## 2017-12-17 DIAGNOSIS — M311 Thrombotic microangiopathy: Secondary | ICD-10-CM | POA: Diagnosis present

## 2017-12-17 DIAGNOSIS — Z8249 Family history of ischemic heart disease and other diseases of the circulatory system: Secondary | ICD-10-CM | POA: Diagnosis not present

## 2017-12-17 LAB — I-STAT CHEM 8, ED
BUN: 54 mg/dL — ABNORMAL HIGH (ref 6–20)
CALCIUM ION: 1.07 mmol/L — AB (ref 1.15–1.40)
Chloride: 110 mmol/L (ref 98–111)
Creatinine, Ser: 6.3 mg/dL — ABNORMAL HIGH (ref 0.44–1.00)
Glucose, Bld: 90 mg/dL (ref 70–99)
HCT: 34 % — ABNORMAL LOW (ref 36.0–46.0)
HEMOGLOBIN: 11.6 g/dL — AB (ref 12.0–15.0)
POTASSIUM: 4.3 mmol/L (ref 3.5–5.1)
SODIUM: 139 mmol/L (ref 135–145)
TCO2: 20 mmol/L — ABNORMAL LOW (ref 22–32)

## 2017-12-17 LAB — COMPREHENSIVE METABOLIC PANEL
ALBUMIN: 3.7 g/dL (ref 3.5–5.0)
ALK PHOS: 166 U/L — AB (ref 38–126)
ALT: 16 U/L (ref 0–44)
AST: 18 U/L (ref 15–41)
Anion gap: 12 (ref 5–15)
BUN: 50 mg/dL — AB (ref 6–20)
CALCIUM: 8.3 mg/dL — AB (ref 8.9–10.3)
CO2: 18 mmol/L — ABNORMAL LOW (ref 22–32)
CREATININE: 6.01 mg/dL — AB (ref 0.44–1.00)
Chloride: 107 mmol/L (ref 98–111)
GFR calc Af Amer: 10 mL/min — ABNORMAL LOW (ref >60)
GFR, EST NON AFRICAN AMERICAN: 9 mL/min — AB (ref >60)
GLUCOSE: 92 mg/dL (ref 70–99)
Potassium: 3.7 mmol/L (ref 3.5–5.1)
Sodium: 137 mmol/L (ref 135–145)
TOTAL PROTEIN: 6.5 g/dL (ref 6.5–8.1)
Total Bilirubin: 0.9 mg/dL (ref 0.3–1.2)

## 2017-12-17 LAB — CBC WITH DIFFERENTIAL/PLATELET
BASOS ABS: 0.1 10*3/uL (ref 0.0–0.1)
Basophils Relative: 2 %
EOS ABS: 0.3 10*3/uL (ref 0.0–0.7)
EOS PCT: 8 %
HCT: 33.5 % — ABNORMAL LOW (ref 36.0–46.0)
Hemoglobin: 11 g/dL — ABNORMAL LOW (ref 12.0–15.0)
LYMPHS PCT: 25 %
Lymphs Abs: 1 10*3/uL (ref 0.7–4.0)
MCH: 29.6 pg (ref 26.0–34.0)
MCHC: 32.8 g/dL (ref 30.0–36.0)
MCV: 90.1 fL (ref 78.0–100.0)
MONO ABS: 0.3 10*3/uL (ref 0.1–1.0)
Monocytes Relative: 8 %
Neutro Abs: 2.4 10*3/uL (ref 1.7–7.7)
Neutrophils Relative %: 57 %
PLATELETS: 192 10*3/uL (ref 150–400)
RBC: 3.72 MIL/uL — ABNORMAL LOW (ref 3.87–5.11)
RDW: 17.5 % — AB (ref 11.5–15.5)
WBC: 4.1 10*3/uL (ref 4.0–10.5)

## 2017-12-17 LAB — RAPID URINE DRUG SCREEN, HOSP PERFORMED
Amphetamines: NOT DETECTED
BARBITURATES: NOT DETECTED
BENZODIAZEPINES: NOT DETECTED
COCAINE: POSITIVE — AB
Opiates: NOT DETECTED
Tetrahydrocannabinol: NOT DETECTED

## 2017-12-17 LAB — I-STAT TROPONIN, ED: TROPONIN I, POC: 0.03 ng/mL (ref 0.00–0.08)

## 2017-12-17 LAB — TSH: TSH: 2.935 u[IU]/mL (ref 0.350–4.500)

## 2017-12-17 LAB — TROPONIN I: Troponin I: 0.04 ng/mL (ref <0.03)

## 2017-12-17 MED ORDER — LABETALOL HCL 5 MG/ML IV SOLN
20.0000 mg | Freq: Once | INTRAVENOUS | Status: AC
  Administered 2017-12-17: 20 mg via INTRAVENOUS
  Filled 2017-12-17: qty 4

## 2017-12-17 MED ORDER — SENNOSIDES-DOCUSATE SODIUM 8.6-50 MG PO TABS: 1.0000 | ORAL_TABLET | Freq: Every evening | ORAL | Status: DC | PRN

## 2017-12-17 MED ORDER — ACETAMINOPHEN 650 MG RE SUPP: 650.0000 mg | Freq: Four times a day (QID) | RECTAL | Status: DC | PRN

## 2017-12-17 MED ORDER — HEPARIN SODIUM (PORCINE) 5000 UNIT/ML IJ SOLN
5000.0000 [IU] | Freq: Three times a day (TID) | INTRAMUSCULAR | Status: DC
  Administered 2017-12-17 – 2017-12-24 (×17): 5000 [IU] via SUBCUTANEOUS
  Filled 2017-12-17 (×17): qty 1

## 2017-12-17 MED ORDER — SODIUM CHLORIDE 0.9 % IV SOLN
Freq: Once | INTRAVENOUS | Status: AC
  Administered 2017-12-17: 15:00:00 via INTRAVENOUS

## 2017-12-17 MED ORDER — CALCITRIOL 0.25 MCG PO CAPS
0.2500 ug | ORAL_CAPSULE | Freq: Every day | ORAL | Status: DC
  Administered 2017-12-18 – 2017-12-24 (×7): 0.25 ug via ORAL
  Filled 2017-12-17 (×7): qty 1

## 2017-12-17 MED ORDER — ONDANSETRON HCL 4 MG/2ML IJ SOLN
4.0000 mg | Freq: Four times a day (QID) | INTRAMUSCULAR | Status: DC | PRN
  Administered 2017-12-18: 4 mg via INTRAVENOUS
  Filled 2017-12-17: qty 2

## 2017-12-17 MED ORDER — SODIUM BICARBONATE 650 MG PO TABS
650.0000 mg | ORAL_TABLET | Freq: Two times a day (BID) | ORAL | Status: DC
  Administered 2017-12-17 – 2017-12-24 (×13): 650 mg via ORAL
  Filled 2017-12-17 (×13): qty 1

## 2017-12-17 MED ORDER — ACETAMINOPHEN 325 MG PO TABS
650.0000 mg | ORAL_TABLET | Freq: Four times a day (QID) | ORAL | Status: DC | PRN
  Administered 2017-12-18 – 2017-12-20 (×6): 650 mg via ORAL
  Filled 2017-12-17 (×8): qty 2

## 2017-12-17 MED ORDER — ONDANSETRON HCL 4 MG PO TABS: 4.0000 mg | ORAL_TABLET | Freq: Four times a day (QID) | ORAL | Status: DC | PRN

## 2017-12-17 MED ORDER — FUROSEMIDE 10 MG/ML IJ SOLN
120.0000 mg | Freq: Two times a day (BID) | INTRAVENOUS | Status: DC
  Administered 2017-12-17 – 2017-12-22 (×9): 120 mg via INTRAVENOUS
  Filled 2017-12-17: qty 12
  Filled 2017-12-17: qty 10
  Filled 2017-12-17 (×2): qty 12
  Filled 2017-12-17 (×2): qty 10
  Filled 2017-12-17: qty 2
  Filled 2017-12-17: qty 12
  Filled 2017-12-17 (×2): qty 10
  Filled 2017-12-17: qty 12
  Filled 2017-12-17 (×2): qty 10

## 2017-12-17 MED ORDER — NICARDIPINE HCL IN NACL 20-0.86 MG/200ML-% IV SOLN
3.0000 mg/h | INTRAVENOUS | Status: DC
  Administered 2017-12-17 (×2): 7.5 mg/h via INTRAVENOUS
  Administered 2017-12-17 – 2017-12-18 (×2): 5 mg/h via INTRAVENOUS
  Administered 2017-12-18: 10 mg/h via INTRAVENOUS
  Filled 2017-12-17 (×5): qty 200

## 2017-12-17 MED ORDER — NICARDIPINE HCL IN NACL 20-0.86 MG/200ML-% IV SOLN
3.0000 mg/h | Freq: Once | INTRAVENOUS | Status: AC
  Administered 2017-12-17: 5 mg/h via INTRAVENOUS
  Filled 2017-12-17: qty 200

## 2017-12-17 NOTE — ED Triage Notes (Signed)
She reports feeling a bit short of breath this morning. She states she spent the night last night with a friend, who has cats. She has reacted this way in the past when she was around cats. She mildly short of breath and in no distress.

## 2017-12-17 NOTE — ED Provider Notes (Signed)
[facility] DEPT Provider Note   CSN: [id number] Arrival date & time: 12/17/17  4193     History   Chief Complaint Chief Complaint  Patient presents with  . Allergic Reaction    HPI Valerie Mathis is a 32 y.o. female.  Patient is a 32 year old female with a history of uncontrolled hypertension, prior cocaine abuse, aortic dissection status post repair in 2018 and chronic kidney disease who presents with shortness of breath.  She states that she has been feeling short of breath since last night.  She described a tightness to her chest but denies any other chest pain.  She has had some intermittent swelling to her lower legs which is slightly worse than her baseline.  She denies any pleuritic pain.  No nausea or vomiting.  No dizziness.  She was concerned that this may have been related to sleeping near some cats last night although she had some similar symptoms in the past both with her aortic dissection and recent admission for hypertensive emergency.  She was admitted about 2 weeks ago for hypertensive emergency and required a Cardene drip.  She had an MRI of her aorta at that time that showed no interval change.  She denies any rash.  She denies any facial swelling or tongue swelling.  No history of allergic reactions in the past.     Past Medical History:  Diagnosis Date  . Aortic dissection ([place])   . CHF (congestive heart failure) ([place])   . CKD (chronic kidney disease) stage 4, GFR 15-29 ml/min (HCC)   . H/O: alcohol abuse   . Hypertension   . Renal disorder   . TTP (thrombotic thrombocytopenic purpura) [facility])     Patient Active Problem List   Diagnosis Date Noted  . Hypertensive urgency 11/29/2017  . ARF (acute renal failure) ([place]) 11/29/2017  . History of LUE DVT (deep vein thrombosis) 10/21/2017  . Abdominal pain 10/20/2017  . Uncontrolled hypertension 10/20/2017  . Hypertensive cardiomyopathy ([place]) 10/20/2017  . Aortic dissection Stanford  type B s/p repair 2018 ([place])   . Hypertensive heart disease without heart failure   . Cocaine use   . Hypertensive emergency 09/12/2017  . Back pain 09/12/2017  . Tachycardia 09/12/2017  . Chronic kidney disease Stage IV-V   . 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 ECTOPIC PREGNANCY SURGERY  2018  . REPAIR OF ACUTE ASCENDING THORACIC AORTIC DISSECTION       OB History   None      Home Medications    Prior to Admission medications   Medication Sig Start Date End Date Taking? Authorizing Provider  amLODipine (NORVASC) 10 MG tablet Take 1 tablet (10 mg total) by mouth daily. for high blood pressure 12/03/17   Lavina Hamman, MD  calcitRIOL (ROCALTROL) 0.25 MCG capsule Take 1 capsule (0.25 mcg total) by mouth daily. 12/03/17   Lavina Hamman, MD  carvedilol (COREG) 25 MG tablet Take 1 tablet (25 mg total) by mouth 2 (two) times daily with a meal. 12/03/17   Lavina Hamman, MD  cloNIDine (CATAPRES) 0.2 MG tablet Take 1 tablet (0.2 mg total) by mouth 3 (three) times daily. 12/03/17   Lavina Hamman, MD  ferrous sulfate 325 (65 FE) MG tablet Take 1 tablet (325 mg total) by mouth 2 (two) times daily with a meal. Patient not taking: Reported on 11/29/2017 09/15/17   Damita Lack, MD  furosemide (  LASIX) 80 MG tablet Take 1 tablet (80 mg total) by mouth 2 (two) times daily. 12/03/17   Lavina Hamman, MD  hydrALAZINE (APRESOLINE) 100 MG tablet Take 1 tablet (100 mg total) by mouth 3 (three) times daily. 12/03/17   Lavina Hamman, MD  polyethylene glycol [facility]) packet Take 17 g by mouth daily as needed for moderate constipation or severe constipation. Patient not taking: Reported on 11/29/2017 10/25/17   Welford Roche, MD    Family History Family History  Problem Relation Age of Onset  . Hypertension Mother     Social History Social History   Tobacco Use  . Smoking status: Current Every Day Smoker    Packs/day: 0.50      Years: 4.00    Pack years: 2.00    Types: Cigarettes  . Smokeless tobacco: Never Used  Substance Use Topics  . Alcohol use: Yes    Comment: OCCASIONAL  . Drug use: Yes    Types: Cocaine, Benzodiazepines    Comment: Last use 08/2017     Allergies   Patient has no known allergies.   Review of Systems Review of Systems  Constitutional: Positive for fatigue. Negative for chills, diaphoresis and fever.  HENT: Negative for congestion, rhinorrhea and sneezing.   Eyes: Negative.   Respiratory: Positive for chest tightness and shortness of breath. Negative for cough.   Cardiovascular: Positive for leg swelling. Negative for chest pain.  Gastrointestinal: Negative for abdominal pain, blood in stool, diarrhea, nausea and vomiting.  Genitourinary: Negative for difficulty urinating, flank pain, frequency and hematuria.  Musculoskeletal: Negative for arthralgias and back pain.  Skin: Negative for rash.  Neurological: Negative for dizziness, speech difficulty, weakness, numbness and headaches.     Physical Exam Updated Vital Signs BP (!) 162/92   Pulse 79   Temp 97.9 F (36.6 C) (Oral)   Resp 16   Ht 5\' 3"  (1.6 m)   Wt 60.8 kg (134 lb)   LMP 11/16/2017 (Approximate)   SpO2 96%   BMI 23.74 kg/m   Physical Exam  Constitutional: She is oriented to person, place, and time. She appears well-developed and well-nourished. She appears distressed.  HENT:  Head: Normocephalic and atraumatic.  Eyes: Pupils are equal, round, and reactive to light.  Neck: Normal range of motion. Neck supple.  Cardiovascular: Normal rate, regular rhythm and normal heart sounds.  Pulmonary/Chest: Effort normal and breath sounds normal. No respiratory distress. She has no wheezes. She has no rales. She exhibits no tenderness.  Mild tachypnea  Abdominal: Soft. Bowel sounds are normal. There is no tenderness. There is no rebound and no guarding.  Musculoskeletal: Normal range of motion. She exhibits edema.   Lymphadenopathy:    She has no cervical adenopathy.  Neurological: She is alert and oriented to person, place, and time.  Skin: Skin is warm and dry. No rash noted.  Psychiatric: She has a normal mood and affect.     ED Treatments / Results  Labs (all labs ordered are listed, but only abnormal results are displayed) Labs Reviewed  COMPREHENSIVE METABOLIC PANEL - Abnormal; Notable for the following components:      Result Value   CO2 18 (*)    BUN 50 (*)    Creatinine, Ser 6.01 (*)    Calcium 8.3 (*)    Alkaline Phosphatase 166 (*)    GFR calc non Af Amer 9 (*)    GFR calc Af Amer 10 (*)    All other components within normal  limits  CBC WITH DIFFERENTIAL/PLATELET - Abnormal; Notable for the following components:   RBC 3.72 (*)    Hemoglobin 11.0 (*)    HCT 33.5 (*)    RDW 17.5 (*)    All other components within normal limits  RAPID URINE DRUG SCREEN, HOSP PERFORMED - Abnormal; Notable for the following components:   Cocaine POSITIVE (*)    All other components within normal limits  I-STAT CHEM 8, ED - Abnormal; Notable for the following components:   BUN 54 (*)    Creatinine, Ser 6.30 (*)    Calcium, Ion 1.07 (*)    TCO2 20 (*)    Hemoglobin 11.6 (*)    HCT 34.0 (*)    All other components within normal limits  I-STAT TROPONIN, ED    EKG EKG Interpretation  Date/Time:  Saturday December 17 2017 11:42:06 EDT Ventricular Rate:  86 PR Interval:    QRS Duration: 88 QT Interval:  381 QTC Calculation: 456 R Axis:   -6 Text Interpretation:  Sinus rhythm Probable left atrial enlargement Borderline T wave abnormalities since last tracing no significant change Confirmed by Malvin Johns [phone]) on 12/17/2017 1:31:34 PM   Radiology Dg Chest 2 View  Result Date: 12/17/2017 CLINICAL DATA:  Shortness of breath this morning. EXAM: CHEST - 2 VIEW COMPARISON:  Chest x-ray dated 09/13/2017. FINDINGS: Stable cardiomegaly. Overall cardiomediastinal silhouette is stable, with  configuration compatible with patient's known chronic dissection involving the distal aortic arch and descending thoracic aorta (most recently demonstrated on chest MRI dated 11/29/2017). Lungs are clear. No pleural effusion or pneumothorax seen. No acute or suspicious osseous finding. IMPRESSION: No active cardiopulmonary disease. No evidence of pneumonia or pulmonary edema. Stable chronic findings detailed above. Electronically Signed   By: Franki Cabot M.D.   On: 12/17/2017 12:13    Procedures Procedures (including critical care time)  Medications Ordered in ED Medications  labetalol (NORMODYNE,TRANDATE) injection 20 mg (20 mg Intravenous Given 12/17/17 1142)  labetalol (NORMODYNE,TRANDATE) injection 20 mg (20 mg Intravenous Given 12/17/17 1254)  nicardipine (CARDENE) 20mg  in 0.86% saline 274ml IV infusion (0.1 mg/ml) (3 mg/hr Intravenous Rate/Dose Change 12/17/17 1614)  0.9 %  sodium chloride infusion ( Intravenous Rate/Dose Verify 12/17/17 1556)     Initial Impression / Assessment and Plan / ED Course  I have reviewed the triage vital signs and the nursing notes.  Pertinent labs & imaging results that were available during my care of the patient were reviewed by me and considered in my medical decision making (see chart for details).     Patient is a 32 year old female who presents with a hypertensive emergency.  She was given 2 doses of labetalol without improvement in symptoms.  She was started on Cardene drip.  Her blood pressure is improving on the Cardene drip.  Her shortness of breath has improved.  She is maintaining normal oxygen saturations.  Her labs are similar to baseline values.  Her chest x-ray does not show any evidence of pulmonary edema.  There is no ischemic changes on EKG.  She does admit that she has recently used cocaine 2 days ago.  I spoke with the intensivist who advised her that the blood pressure is improving on Cardene that she can go to stepdown versus ICU.  I spoke  with Dr. Alfredia Ferguson who will admit the pt.  CRITICAL CARE Performed by: Malvin Johns Total critical care time: 60 minutes Critical care time was exclusive of separately billable procedures and treating other patients. Critical  care was necessary to treat or prevent imminent or life-threatening deterioration. Critical care was time spent personally by me on the following activities: development of treatment plan with patient and/or surrogate as well as nursing, discussions with consultants, evaluation of patient's response to treatment, examination of patient, obtaining history from patient or surrogate, ordering and performing treatments and interventions, ordering and review of laboratory studies, ordering and review of radiographic studies, pulse oximetry and re-evaluation of patient's condition.   Final Clinical Impressions(s) / ED Diagnoses   Final diagnoses:  Hypertensive emergency  Chronic kidney disease, unspecified CKD stage    ED Discharge Orders    None       Malvin Johns, MD 12/17/17 1640

## 2017-12-17 NOTE — H&P (Signed)
History and Physical    Valerie Mathis TJQ:[id number] DOB: Oct 01, 1985 DOA: 12/17/2017  PCP: Patient, No Pcp Per   Patient coming from: Home  Chief Complaint: Chest Pressure and Shortness of Breath  HPI: Valerie Mathis is a 32 y.o. 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, chronic combined CHF, chronic kidney disease stage IV/V, TTP, hypertension, substance abuse, tobacco abuse and other comorbidities who presented to [place] [facility] emergency room with a chief complaint of chest pressure and shortness of breath.  Patient states that she recently did cocaine about 2 days ago and was in her normal state of health until this morning when she woke up short of breath.  She states that she lives with her roommate who has a cat and thought the cat was causing her allergies for shortness breath.  Patient states that she could not get comfortable and started developing chest pressure after that.  States that she took her blood pressure medications this morning but symptoms persisted and she presented to the emergency room for further evaluation.  Of note patient does have a history of noncompliance with medications and she does not have a primary care physician.  Patient denies any nausea, vomiting, lightheadedness or dizziness complain of chest pressure and shortness of breath.  She described the chest pressure as "a ton of bricks sitting on her chest."  Patient denies any dizziness and states this felt similar to when she had been recently admitted for hypertensive emergency.  She denies any other complaints or concerns at this time and TRH was called to admit this patient for hypertensive emergency likely secondary to cocaine abuse.  Of note the patient had a recent MRI of the aorta which showed no interval change for aortic dissection  ED Course: She had basic blood work done and was placed on a Cardene drip.  Chest x-ray was also done along with a UDS which was  positive for cocaine.  Review of Systems: As per HPI otherwise 10 point review of systems negative.   Past Medical History:  Diagnosis Date  . Aortic dissection ([place])   . CHF (congestive heart failure) ([place])   . CKD (chronic kidney disease) stage 4, GFR 15-29 ml/min (HCC)   . H/O: alcohol abuse   . Hypertension   . Renal disorder   . 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 ECTOPIC PREGNANCY SURGERY  2018  . REPAIR OF ACUTE ASCENDING THORACIC AORTIC DISSECTION     SOCIAL HISTORY  reports that she has been smoking cigarettes.  She has a 2.00 pack-year smoking history. She has never used smokeless tobacco. She reports that she drinks alcohol. She reports that she has current or past drug history. Drugs: Cocaine and Benzodiazepines.  ALLERGIES No Known Allergies  Family History  Problem Relation Age of Onset  . Hypertension Mother    Prior to Admission medications   Medication Sig Start Date End Date Taking? Authorizing Provider  amLODipine (NORVASC) 10 MG tablet Take 1 tablet (10 mg total) by mouth daily. for high blood pressure 12/03/17  Yes Lavina Hamman, MD  carvedilol (COREG) 25 MG tablet Take 1 tablet (25 mg total) by mouth 2 (two) times daily with a meal. 12/03/17  Yes Lavina Hamman, MD  cloNIDine (CATAPRES) 0.2 MG tablet Take 1 tablet (0.2 mg total) by mouth 3 (three) times daily. 12/03/17  Yes Lavina Hamman, MD  furosemide (  LASIX) 80 MG tablet Take 1 tablet (80 mg total) by mouth 2 (two) times daily. 12/03/17  Yes Lavina Hamman, MD  hydrALAZINE (APRESOLINE) 100 MG tablet Take 1 tablet (100 mg total) by mouth 3 (three) times daily. 12/03/17  Yes Lavina Hamman, MD  sodium bicarbonate 650 MG tablet Take 650 mg by mouth 2 (two) times daily.    Yes [provider]  calcitRIOL (ROCALTROL) 0.25 MCG capsule Take 1 capsule (0.25 mcg total) by mouth daily. Patient not taking: Reported on 2017-12-26 12/03/17    Lavina Hamman, MD  ferrous sulfate 325 (65 FE) MG tablet Take 1 tablet (325 mg total) by mouth 2 (two) times daily with a meal. Patient not taking: Reported on 11/29/2017 09/15/17   Damita Lack, MD  polyethylene glycol [facility]) packet Take 17 g by mouth daily as needed for moderate constipation or severe constipation. Patient not taking: Reported on 11/29/2017 10/25/17   Welford Roche, MD   Physical Exam: Vitals:   12-26-17 1811 2017/12/26 1812 Dec 26, 2017 1813 December 26, 2017 1829  BP:    (!) 207/129  Pulse:    80  Resp:    (!) 26  Temp:    98 F (36.7 C)  TempSrc:    Oral  SpO2: 99% 98% 98% 97%  Weight:    62.6 kg (138 lb 0.1 oz)  Height:    5\' 3"  (1.6 m)   Constitutional: WN/WD overweight AAF in NAD and appears calm and comfortable Eyes: Lids and conjunctivae normal, sclerae anicteric  ENMT: External Ears, Nose appear normal. Grossly normal hearing. Mucous membranes are moist.  Neck: Appears normal, supple, no cervical masses, normal ROM, no appreciable thyromegaly, no JVD Respiratory: Diminished to auscultation bilaterally, no wheezing, rales, rhonchi or crackles. Normal respiratory effort and patient is not tachypenic. No accessory muscle use.  Cardiovascular: RRR, no murmurs / rubs / gallops. S1 and S2 auscultated. 1+ LE extremity edema.  Abdomen: Soft, non-tender, Distended due to body habitus. No masses palpated. No appreciable hepatosplenomegaly. Bowel sounds positive x4  GU: Deferred. Musculoskeletal: No clubbing / cyanosis of digits/nails. Normal strength and muscle tone.  Skin: No rashes, lesions, ulcers on a limited skin eval. No induration; Warm and dry.  Neurologic: CN 2-12 grossly intact with no focal deficits. Romberg sign and cerebellar reflexes not assessed.  Psychiatric: Normal judgment and insight. Alert and oriented x 3. Normal mood and appropriate affect.   Labs on Admission: I have personally reviewed following labs and imaging studies  CBC: Recent Labs    Lab 12-26-2017 1133 12-26-17 1139  WBC 4.1  --   NEUTROABS 2.4  --   HGB 11.0* 11.6*  HCT 33.5* 34.0*  MCV 90.1  --   PLT 192  --    Basic Metabolic Panel: Recent Labs  Lab 12-26-17 1133 12/26/17 1139  NA 137 139  K 3.7 4.3  CL 107 110  CO2 18*  --   GLUCOSE 92 90  BUN 50* 54*  CREATININE 6.01* 6.30*  CALCIUM 8.3*  --    GFR: Estimated Creatinine Clearance: 10.9 mL/min (A) (by C-G formula based on SCr of 6.3 mg/dL (H)). Liver Function Tests: Recent Labs  Lab 12-26-17 1133  AST 18  ALT 16  ALKPHOS 166*  BILITOT 0.9  PROT 6.5  ALBUMIN 3.7   No results for input(s): LIPASE, AMYLASE in the last 168 hours. No results for input(s): AMMONIA in the last 168 hours. Coagulation Profile: No results for input(s): INR, PROTIME in the  last 168 hours. Cardiac Enzymes: No results for input(s): CKTOTAL, CKMB, CKMBINDEX, TROPONINI in the last 168 hours. BNP (last 3 results) No results for input(s): PROBNP in the last 8760 hours. HbA1C: No results for input(s): HGBA1C in the last 72 hours. CBG: No results for input(s): GLUCAP in the last 168 hours. Lipid Profile: No results for input(s): CHOL, HDL, LDLCALC, TRIG, CHOLHDL, LDLDIRECT in the last 72 hours. Thyroid Function Tests: No results for input(s): TSH, T4TOTAL, FREET4, T3FREE, THYROIDAB in the last 72 hours. Anemia Panel: No results for input(s): VITAMINB12, FOLATE, FERRITIN, TIBC, IRON, RETICCTPCT in the last 72 hours. Urine analysis:    Component Value Date/Time   COLORURINE YELLOW 11/29/2017 1922   APPEARANCEUR CLOUDY (A) 11/29/2017 1922   LABSPEC 1.014 11/29/2017 1922   PHURINE 5.0 11/29/2017 1922   GLUCOSEU NEGATIVE 11/29/2017 1922   HGBUR SMALL (A) 11/29/2017 1922   BILIRUBINUR NEGATIVE 11/29/2017 [place] 11/29/2017 1922   PROTEINUR >=300 (A) 11/29/2017 1922   NITRITE NEGATIVE 11/29/2017 1922   LEUKOCYTESUR MODERATE (A) 11/29/2017 1922   Sepsis Labs:  !!!!!!!!!!!!!!!!!!!!!!!!!!!!!!!!!!!!!!!!!!!! @LABRCNTIP (procalcitonin:4,lacticidven:4) )No results found for this or any previous visit (from the past 240 hour(s)).   Radiological Exams on Admission: Dg Chest 2 View  Result Date: 12/17/2017 CLINICAL DATA:  Shortness of breath this morning. EXAM: CHEST - 2 VIEW COMPARISON:  Chest x-ray dated 09/13/2017. FINDINGS: Stable cardiomegaly. Overall cardiomediastinal silhouette is stable, with configuration compatible with patient's known chronic dissection involving the distal aortic arch and descending thoracic aorta (most recently demonstrated on chest MRI dated 11/29/2017). Lungs are clear. No pleural effusion or pneumothorax seen. No acute or suspicious osseous finding. IMPRESSION: No active cardiopulmonary disease. No evidence of pneumonia or pulmonary edema. Stable chronic findings detailed above. Electronically Signed   By: Franki Cabot M.D.   On: 12/17/2017 12:13   EKG: Independently reviewed.  Normal sinus rhythm with a rate of 86 no evidence of any ST elevation appreciated with some wandering baseline.  She did have some also nonspecific T wave abnormalities and likely atrial enlargement.  Assessment/Plan Active Problems:   Hypertensive emergency   Chronic kidney disease Stage IV-V   Aortic dissection Stanford type B s/p repair 2018 [facility])   Cocaine use   Uncontrolled hypertension   Hypertensive cardiomyopathy ([place])  Hypertensive Emergency in the Setting of Cocaine Abuse -Admit to Inpatient SDU and Started on nicardipine infusion. Restart home bp medications when improving and able to be weaned of Cardene -Start IV Lasix 120 mg BID -Hold Home Antihypertensives for now and restart when BP remains around 180 SBP -Close ICU Monitoring -Frequent Blood Pressure Monitoring -Intensivist consulted by EDP but because blood pressure improved on Cardene recommended TRH Admission -Given IV Labetalol 20 mg x2 in the ED but will avoid BB given Recent  Cocaine -Cycle Troponin to r/o new Ischemia  -Check TSH, Mag, Phos  Hypertension uncontrolled, see above -Resume Home Antihypertensive medications when stable on Cardene gtt and weaning off   Acute kidney injury on CKD stage 4 (baseline creatinine 4) -BUN/Cr steadily increasing since the last few admissions; Now is 54/6.30 -Start IV Lasix 120 mg BID -Continue po Bicarb -Had an appointment with Nephrology Dr. Joelyn Oms this week but may be beneficial to consult Nephrology in AM  -Renal Diet with Fluid Restriction   H/o Combined CHF -ECHO showed Diastolic Grade 2 dysfunction last time -Hold Medications -Appears compensated but had some mild leg swelling -Start IV Lasix -If not Improving Consult Cardiology   Cocaine  and Tobacco Abuse -Smoking and Cocaine Abuse Counseling given -Provide Nicotine Patch   Hx of Aortic Dissection -Recent MRI showed it was stable -CXR today showed Stable cardiomegaly. Overall cardiomediastinal silhouette is stable, with configuration compatible with patient's known chronic dissection involving the distal aortic arch and descending thoracic aorta (most recently demonstrated on chest MRI dated 11/29/2017). Lungs are clear. No pleural effusion or pneumothorax seen. No acute or suspicious osseous finding.  DVT prophylaxis: Heparin 5,000 units sq q8h Code Status: FULL CODE Family Communication:  No family present at bedside Disposition Plan: Anticipate D/C Home in next 48-72 hours if medically stable Consults called: None; EDP called Intensivist  Admission status: Inpatient ICU  Severity of Illness: The appropriate patient status for this patient is INPATIENT. Inpatient status is judged to be reasonable and necessary in order to provide the required intensity of service to ensure the patient's safety. The patient's presenting symptoms, physical exam findings, and initial radiographic and laboratory data in the context of their chronic comorbidities is felt  to place them at high risk for further clinical deterioration. Furthermore, it is not anticipated that the patient will be medically stable for discharge from the hospital within 2 midnights of admission. The following factors support the patient status of inpatient.   " The patient's presenting symptoms include Chest Pressure and SOB. " The worrisome physical exam findings include LE Swelling. " The initial radiographic and laboratory data are worrisome because of of Worsening Cr and elevated BP. " The chronic co-morbidities include HTN, Hx of Aortic Dissection, Chronic Combined CHF, Tobacco Abuse, Cocaine Abuse.  * I certify that at the point of admission it is my clinical judgment that the patient will require inpatient hospital care spanning beyond 2 midnights from the point of admission due to high intensity of service, high risk for further deterioration and high frequency of surveillance required.Kerney Elbe, D.O. [facility] Pager [phone]  If 7PM-7AM, please contact night-coverage [email or web address] Password [facility]  12/17/2017, 6:54 PM

## 2017-12-17 NOTE — ED Notes (Signed)
IV pump will not connect to WiFi, despite restarting pump multiple times.. Pump manually programmed.

## 2017-12-17 NOTE — ED Notes (Signed)
ED TO INPATIENT HANDOFF REPORT  Name/Age/Gender Valerie Mathis 32 y.o. female  Code Status Code Status History    Date Active Date Inactive Code Status Order ID Comments User Context   11/29/2017 2109 12/03/2017 1752 Full Code [id number]  Jani Gravel, MD ED   10/20/2017 1328 10/25/2017 1900 Full Code [id number]  Welford Roche, MD ED   09/12/2017 1920 09/15/2017 1832 Full Code [id number]  Jani Gravel, MD ED      Home/SNF/Other Home  Chief Complaint diff breathing  Level of Care/Admitting Diagnosis ED Disposition    ED Disposition Condition [place]: [facility] [[id number]]  Level of Care: ICU [6]  Diagnosis: Hypertensive emergency [phone]  Admitting Physician: [place], [facility] [[id number]]  Attending Physician: Raiford Noble LATIF [[id number]]  Estimated length of stay: past midnight tomorrow  Certification:: I certify this patient will need inpatient services for at least 2 midnights  PT Class (Do Not Modify): Inpatient [101]  PT Acc Code (Do Not Modify): Private [1]       Medical History Past Medical History:  Diagnosis Date  . Aortic dissection ([place])   . CHF (congestive heart failure) ([place])   . CKD (chronic kidney disease) stage 4, GFR 15-29 ml/min (HCC)   . H/O: alcohol abuse   . Hypertension   . Renal disorder   . TTP (thrombotic thrombocytopenic purpura) (HCC)     Allergies No Known Allergies  IV Location/Drains/Wounds Patient Lines/Drains/Airways Status   Active Line/Drains/Airways    Name:   Placement date:   Placement time:   Site:   Days:   Peripheral IV 12/17/17 Right Wrist   12/17/17    1128    Wrist   less than 1          Labs/Imaging Results for orders placed or performed during the hospital encounter of 12/17/17 (from the past 48 hour(s))  Comprehensive metabolic panel     Status: Abnormal   Collection Time: 12/17/17 11:33 AM  Result Value Ref Range   Sodium 137 135 - 145 mmol/L   Potassium 3.7 3.5 - 5.1  mmol/L   Chloride 107 98 - 111 mmol/L   CO2 18 (L) 22 - 32 mmol/L   Glucose, Bld 92 70 - 99 mg/dL   BUN 50 (H) 6 - 20 mg/dL   Creatinine, Ser 6.01 (H) 0.44 - 1.00 mg/dL   Calcium 8.3 (L) 8.9 - 10.3 mg/dL   Total Protein 6.5 6.5 - 8.1 g/dL   Albumin 3.7 3.5 - 5.0 g/dL   AST 18 15 - 41 U/L   ALT 16 0 - 44 U/L   Alkaline Phosphatase 166 (H) 38 - 126 U/L   Total Bilirubin 0.9 0.3 - 1.2 mg/dL   GFR calc non Af Amer 9 (L) >60 mL/min   GFR calc Af Amer 10 (L) >60 mL/min    Comment: (NOTE) The eGFR has been calculated using the CKD EPI equation. This calculation has not been validated in all clinical situations. eGFR's persistently <60 mL/min signify possible Chronic Kidney Disease.    Anion gap 12 5 - 15    Comment: Performed at [facility], [place] [address], [place], [place] [address]  CBC with Differential     Status: Abnormal   Collection Time: 12/17/17 11:33 AM  Result Value Ref Range   WBC 4.1 4.0 - 10.5 K/uL   RBC 3.72 (L) 3.87 - 5.11 MIL/uL   Hemoglobin 11.0 (L) 12.0 - 15.0 g/dL  HCT 33.5 (L) 36.0 - 46.0 %   MCV 90.1 78.0 - 100.0 fL   MCH 29.6 26.0 - 34.0 pg   MCHC 32.8 30.0 - 36.0 g/dL   RDW 17.5 (H) 11.5 - 15.5 %   Platelets 192 150 - 400 K/uL   Neutrophils Relative % 57 %   Neutro Abs 2.4 1.7 - 7.7 K/uL   Lymphocytes Relative 25 %   Lymphs Abs 1.0 0.7 - 4.0 K/uL   Monocytes Relative 8 %   Monocytes Absolute 0.3 0.1 - 1.0 K/uL   Eosinophils Relative 8 %   Eosinophils Absolute 0.3 0.0 - 0.7 K/uL   Basophils Relative 2 %   Basophils Absolute 0.1 0.0 - 0.1 K/uL    Comment: Performed at [facility], [place] [address], [place], [place] [address]  I-stat troponin, ED     Status: None   Collection Time: 12/17/17 11:37 AM  Result Value Ref Range   Troponin i, poc 0.03 0.00 - 0.08 ng/mL   Comment 3            Comment: Due to the release kinetics of cTnI, a negative result within the first hours of the onset of symptoms does not rule  out myocardial infarction with certainty. If myocardial infarction is still suspected, repeat the test at appropriate intervals.   I-stat Chem 8, ED     Status: Abnormal   Collection Time: 12/17/17 11:39 AM  Result Value Ref Range   Sodium 139 135 - 145 mmol/L   Potassium 4.3 3.5 - 5.1 mmol/L   Chloride 110 98 - 111 mmol/L   BUN 54 (H) 6 - 20 mg/dL   Creatinine, Ser 6.30 (H) 0.44 - 1.00 mg/dL   Glucose, Bld 90 70 - 99 mg/dL   Calcium, Ion 1.07 (L) 1.15 - 1.40 mmol/L   TCO2 20 (L) 22 - 32 mmol/L   Hemoglobin 11.6 (L) 12.0 - 15.0 g/dL   HCT 34.0 (L) 36.0 - 46.0 %  Urine rapid drug screen (hosp performed)     Status: Abnormal   Collection Time: 12/17/17 11:43 AM  Result Value Ref Range   Opiates NONE DETECTED NONE DETECTED   Cocaine POSITIVE (A) NONE DETECTED   Benzodiazepines NONE DETECTED NONE DETECTED   Amphetamines NONE DETECTED NONE DETECTED   Tetrahydrocannabinol NONE DETECTED NONE DETECTED   Barbiturates NONE DETECTED NONE DETECTED    Comment: Performed at [facility], [place] [address], [place], [place] [address]   Dg Chest 2 View  Result Date: 12/17/2017 CLINICAL DATA:  Shortness of breath this morning. EXAM: CHEST - 2 VIEW COMPARISON:  Chest x-ray dated 09/13/2017. FINDINGS: Stable cardiomegaly. Overall cardiomediastinal silhouette is stable, with configuration compatible with patient's known chronic dissection involving the distal aortic arch and descending thoracic aorta (most recently demonstrated on chest MRI dated 11/29/2017). Lungs are clear. No pleural effusion or pneumothorax seen. No acute or suspicious osseous finding. IMPRESSION: No active cardiopulmonary disease. No evidence of pneumonia or pulmonary edema. Stable chronic findings detailed above. Electronically Signed   By: Franki Cabot M.D.   On: 12/17/2017 12:13    Pending Labs [employer] (From admission, onward)   Start     Ordered   Signed and Held  TSH  Once,   R     Signed and Held    Signed and Held  Urine culture  Once,   R     Signed and Held   Signed and Held  Troponin I  Now then every 6 hours,   R     Signed and Held   Signed and Held  Urinalysis, Complete w Microscopic  Once,   R     Signed and Held   Signed and Held  Comprehensive metabolic panel  Tomorrow morning,   R     Signed and Held   Signed and Held  CBC  Tomorrow morning,   R     Signed and Held      Vitals/Pain Today's Vitals   12/17/17 1640 12/17/17 1650 12/17/17 1700 12/17/17 1710  BP: (!) 165/96 (!) 166/98 (!) 155/103 (!) 154/87  Pulse:  79 80 80  Resp: (!) 30 15 (!) 23 (!) 21  Temp:      TempSrc:      SpO2:  99% 96% 98%  Weight:      Height:      PainSc:        Isolation Precautions No active isolations  Medications Medications  labetalol (NORMODYNE,TRANDATE) injection 20 mg (20 mg Intravenous Given 12/17/17 1142)  labetalol (NORMODYNE,TRANDATE) injection 20 mg (20 mg Intravenous Given 12/17/17 1254)  nicardipine (CARDENE) 25m in 0.86% saline 2051mIV infusion (0.1 mg/ml) (3 mg/hr Intravenous Rate/Dose Verify 12/17/17 1718)  0.9 %  sodium chloride infusion ( Intravenous Rate/Dose Verify 12/17/17 1718)    Mobility walks

## 2017-12-18 ENCOUNTER — Inpatient Hospital Stay (HOSPITAL_COMMUNITY): Payer: Medicaid Other

## 2017-12-18 LAB — CBC WITH DIFFERENTIAL/PLATELET
BASOS ABS: 0.1 10*3/uL (ref 0.0–0.1)
BASOS PCT: 1 %
EOS ABS: 0.5 10*3/uL (ref 0.0–0.7)
Eosinophils Relative: 9 %
HCT: 31.5 % — ABNORMAL LOW (ref 36.0–46.0)
HEMOGLOBIN: 10.6 g/dL — AB (ref 12.0–15.0)
LYMPHS PCT: 24 %
Lymphs Abs: 1.3 10*3/uL (ref 0.7–4.0)
MCH: 30.3 pg (ref 26.0–34.0)
MCHC: 33.7 g/dL (ref 30.0–36.0)
MCV: 90 fL (ref 78.0–100.0)
Monocytes Absolute: 0.6 10*3/uL (ref 0.1–1.0)
Monocytes Relative: 11 %
Neutro Abs: 2.8 10*3/uL (ref 1.7–7.7)
Neutrophils Relative %: 55 %
Platelets: 266 10*3/uL (ref 150–400)
RBC: 3.5 MIL/uL — AB (ref 3.87–5.11)
RDW: 17.9 % — ABNORMAL HIGH (ref 11.5–15.5)
WBC: 5.3 10*3/uL (ref 4.0–10.5)

## 2017-12-18 LAB — URINALYSIS, COMPLETE (UACMP) WITH MICROSCOPIC
Bilirubin Urine: NEGATIVE
Glucose, UA: NEGATIVE mg/dL
Ketones, ur: NEGATIVE mg/dL
Nitrite: NEGATIVE
Protein, ur: 100 mg/dL — AB
SPECIFIC GRAVITY, URINE: 1.01 (ref 1.005–1.030)
pH: 5 (ref 5.0–8.0)

## 2017-12-18 LAB — COMPREHENSIVE METABOLIC PANEL
ALT: 19 U/L (ref 0–44)
AST: 34 U/L (ref 15–41)
Albumin: 3.2 g/dL — ABNORMAL LOW (ref 3.5–5.0)
Alkaline Phosphatase: 147 U/L — ABNORMAL HIGH (ref 38–126)
Anion gap: 10 (ref 5–15)
BUN: 50 mg/dL — ABNORMAL HIGH (ref 6–20)
CALCIUM: 7.9 mg/dL — AB (ref 8.9–10.3)
CO2: 21 mmol/L — AB (ref 22–32)
CREATININE: 6.07 mg/dL — AB (ref 0.44–1.00)
Chloride: 108 mmol/L (ref 98–111)
GFR calc Af Amer: 10 mL/min — ABNORMAL LOW (ref >60)
GFR calc non Af Amer: 9 mL/min — ABNORMAL LOW (ref >60)
Glucose, Bld: 123 mg/dL — ABNORMAL HIGH (ref 70–99)
Potassium: 5.8 mmol/L — ABNORMAL HIGH (ref 3.5–5.1)
Sodium: 139 mmol/L (ref 135–145)
Total Bilirubin: 1.3 mg/dL — ABNORMAL HIGH (ref 0.3–1.2)
Total Protein: 5.6 g/dL — ABNORMAL LOW (ref 6.5–8.1)

## 2017-12-18 LAB — TROPONIN I
TROPONIN I: 0.05 ng/mL — AB (ref <0.03)
Troponin I: 0.04 ng/mL (ref <0.03)

## 2017-12-18 LAB — MAGNESIUM: Magnesium: 2.4 mg/dL (ref 1.7–2.4)

## 2017-12-18 LAB — GLUCOSE, CAPILLARY: GLUCOSE-CAPILLARY: 131 mg/dL — AB (ref 70–99)

## 2017-12-18 LAB — PHOSPHORUS: PHOSPHORUS: 4.8 mg/dL — AB (ref 2.5–4.6)

## 2017-12-18 MED ORDER — SODIUM POLYSTYRENE SULFONATE 15 GM/60ML PO SUSP
30.0000 g | Freq: Once | ORAL | Status: AC
  Administered 2017-12-18: 30 g via ORAL
  Filled 2017-12-18: qty 120

## 2017-12-18 MED ORDER — ISOSORBIDE MONONITRATE ER 30 MG PO TB24
30.0000 mg | ORAL_TABLET | Freq: Every day | ORAL | Status: DC
  Administered 2017-12-18: 30 mg via ORAL
  Filled 2017-12-18: qty 1

## 2017-12-18 MED ORDER — HYDRALAZINE HCL 50 MG PO TABS
50.0000 mg | ORAL_TABLET | Freq: Three times a day (TID) | ORAL | Status: DC
  Administered 2017-12-18 (×2): 50 mg via ORAL
  Filled 2017-12-18 (×2): qty 1

## 2017-12-18 MED ORDER — ASPIRIN 325 MG PO TABS
325.0000 mg | ORAL_TABLET | Freq: Every day | ORAL | Status: DC
  Administered 2017-12-18 – 2017-12-24 (×7): 325 mg via ORAL
  Filled 2017-12-18 (×8): qty 1

## 2017-12-18 MED ORDER — AMLODIPINE BESYLATE 10 MG PO TABS
10.0000 mg | ORAL_TABLET | Freq: Every day | ORAL | Status: DC
  Administered 2017-12-18 – 2017-12-24 (×7): 10 mg via ORAL
  Filled 2017-12-18 (×7): qty 1

## 2017-12-18 MED ORDER — HYDRALAZINE HCL 50 MG PO TABS
100.0000 mg | ORAL_TABLET | Freq: Three times a day (TID) | ORAL | Status: DC
  Administered 2017-12-18 – 2017-12-24 (×16): 100 mg via ORAL
  Filled 2017-12-18 (×16): qty 2

## 2017-12-18 MED ORDER — NITROGLYCERIN 0.4 MG SL SUBL
0.4000 mg | SUBLINGUAL_TABLET | SUBLINGUAL | Status: AC | PRN
  Administered 2017-12-18 (×3): 0.4 mg via SUBLINGUAL
  Filled 2017-12-18: qty 1

## 2017-12-18 NOTE — Progress Notes (Signed)
Called report to RN on [place] will transfer on telemetry , room air in a wheelchair

## 2017-12-18 NOTE — Progress Notes (Signed)
Patient Demographics:    Valerie Mathis, is a 32 y.o. female, DOB - [birth year], YBO:[id number]  Admit date - 12/17/2017   Admitting Physician Kerney Elbe, DO  Outpatient Primary MD for the patient is Patient, No Pcp Per  LOS - 1   Chief Complaint  Patient presents with  . Allergic Reaction        Subjective:    Valerie Mathis today has no fevers, no emesis,  No chest pain, denies chest pain palpitations or dizziness, no shortness of breath  Assessment  & Plan :    Active Problems:   Hypertensive emergency   Chronic kidney disease Stage IV-V   Aortic dissection [place] type B s/p repair 2018 [facility])   Cocaine use   Uncontrolled hypertension   Hypertensive cardiomyopathy [facility])  Brief Summary 32 y.o. 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, chronic combined CHF, chronic kidney disease stage IV/V, TTP, hypertension, Polysubstance abuse including cocaine abuse, tobacco abuse admitted on 12/17/2017 with hypertensive urgency in the setting of acute cocaine use and noncompliance with medications   Plan:- 1) hypertensive emergency--given acute cocaine use will avoid beta-blockers, patient was on a Cardene drip, improved at this time, off Cardene drip, may transition out of ICU to telemetry bed, give amlodipine 10 mg daily, hydralazine/isosorbide combination.  Patient is notoriously noncompliant  2)AKI----acute kidney injury on CKD stage IV -     creatinine on admission= 6.30,   baseline creatinine = 5.9   , creatinine is now= 6.0 , continue bicarb tablets ,  Avoid nephrotoxic agents/dehydration/hypotension, will need to discuss with nephrology service regarding possible AV graft placement in anticipation of hemodialysis in the near future  3)HYPerkalemia-secondary to #2 above, give Kayexalate 30 g p.o. x1  4) polysubstance abuse including cocaine--UDS  positive for cocaine, drug rehab advised patient is not ready discharge this time  5)HFpEF--- last known EF 55 to 60%, suspect acute exacerbation of underlying chronic systolic dysfunction CHF due to uncontrolled blood pressure and stimulant/cocaine use, IV Lasix 120 mg twice daily as ordered monitor daily weights and fluid balance closely,   6)Hx of Aortic Dissection -Recent MRI showed it was stable -CXR today showed Stable cardiomegaly. Overall cardiomediastinal silhouette is stable, with configuration compatible with patient's known chronic dissection involving the distal aortic arch and descending thoracic aorta (most recently demonstrated on chest MRI dated 11/29/2017). Lungs are clear. No pleural effusion or pneumothorax seen. No acute or suspicious osseous finding.    Code Status : full    Disposition Plan  : home   Consults  :     DVT Prophylaxis  :  - Heparin   Lab Results  Component Value Date   PLT 266 12/18/2017    Inpatient Medications  Scheduled Meds: . amLODipine  10 mg Oral Daily  . aspirin  325 mg Oral Daily  . calcitRIOL  0.25 mcg Oral Daily  . heparin  5,000 Units Subcutaneous Q8H  . hydrALAZINE  100 mg Oral TID  . isosorbide mononitrate  30 mg Oral Daily  . sodium bicarbonate  650 mg Oral BID   Continuous Infusions: . furosemide Stopped (12/18/17 0936)   PRN Meds:.acetaminophen **OR** acetaminophen, ondansetron **OR** ondansetron (ZOFRAN) IV, senna-docusate  Anti-infectives (From admission, onward)   None        Objective:   Vitals:   12/18/17 1100 12/18/17 1106 12/18/17 1107 12/18/17 1216  BP: (!) 170/97 (!) 164/106 (!) 167/95 (!) 160/92  Pulse:    81  Resp: 15 14 15 19   Temp:    98 F (36.7 C)  TempSrc:    Oral  SpO2: 93% 94% 94% 97%  Weight:      Height:        Wt Readings from Last 3 Encounters:  12/18/17 66.3 kg (146 lb 2.6 oz)  12/03/17 60.8 kg (134 lb)  10/25/17 55.4 kg (122 lb 1.6 oz)     Intake/Output Summary  (Last 24 hours) at 12/18/2017 1736 Last data filed at 12/18/2017 1316 Gross per 24 hour  Intake 2300.13 ml  Output 625 ml  Net 1675.13 ml     Physical Exam  Gen:- Awake Alert,  In no apparent distress  HEENT:- [place].AT, No sclera icterus Neck-Supple Neck,No JVD,.  Lungs-  diminished in bases, no wheezing CV- S1, S2 normal Abd-  +ve B.Sounds, Abd Soft, No tenderness,    Extremity/Skin:-Good pulses, negative Homans  psych-affect is appropriate, oriented x3 Neuro-no new focal deficits, no tremors   Data Review:   Micro Results No results found for this or any previous visit (from the past 240 hour(s)).  Radiology Reports X-ray Chest Pa And Lateral  Result Date: 12/18/2017 CLINICAL DATA:  Increased shortness of breath for 2 days. EXAM: CHEST - 2 VIEW COMPARISON:  A3 2019 FINDINGS: Sequelae of sternotomy are again identified. The cardiac silhouette remains enlarged. Stable aortic prominence corresponds to the known chronic dissection. There is new patchy airspace opacity in the right lung base, and there is a new small right pleural effusion. The left lung remains clear. No pneumothorax or acute osseous abnormality is identified. IMPRESSION: New small right pleural effusion with right basilar airspace opacity which may reflect pneumonia or atelectasis. Electronically Signed   By: Logan Bores M.D.   On: 12/18/2017 11:16   Dg Chest 2 View  Result Date: 12/17/2017 CLINICAL DATA:  Shortness of breath this morning. EXAM: CHEST - 2 VIEW COMPARISON:  Chest x-ray dated 09/13/2017. FINDINGS: Stable cardiomegaly. Overall cardiomediastinal silhouette is stable, with configuration compatible with patient's known chronic dissection involving the distal aortic arch and descending thoracic aorta (most recently demonstrated on chest MRI dated 11/29/2017). Lungs are clear. No pleural effusion or pneumothorax seen. No acute or suspicious osseous finding. IMPRESSION: No active cardiopulmonary disease. No evidence  of pneumonia or pulmonary edema. Stable chronic findings detailed above. Electronically Signed   By: Franki Cabot M.D.   On: 12/17/2017 12:13   Mr Jodene Nam Chest Wo Contrast  Result Date: 11/29/2017 CLINICAL DATA:  History prior thoracic aortic dissection with repair of ascending thoracic aortic dissection and 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 Now with chest pain, hypertension and edema involving the left side of the body. Iodinated contrast and gadolinium cannot be administered due to significant renal failure. EXAM: MRA CHEST WITH OR WITHOUT CONTRAST; MRA ABDOMEN WITH OR WITHOUT CONTRAST TECHNIQUE: Angiographic images of the chest were obtained using MRA technique without intravenous contrast. CONTRAST:  None COMPARISON:  10/20/2017 FINDINGS: VASCULAR Aorta: Stable appearance of the thoracic aorta with prior repair of the ascending thoracic aorta. Residual chronic dissection which begins in the distal arch has a stable appearance. Maximum of the distal arch and proximal descending thoracic aorta is 4.3 cm and is  stable. Intimal flap extends again to the level of the mid descending thoracic aorta. No evidence of significant enlargement of the false lumen or acute hemorrhage. Heart: Stable cardiac enlargement and evidence of left ventricular hypertrophy. Trace pericardial fluid. Pulmonary Arteries:  Normal caliber central pulmonary arteries. Other: The abdominal aorta and its branches remain unremarkable without evidence of aneurysm or dissection. NON-VASCULAR There is some interval enlargement in size of a right pleural effusion which remains still of fairly small volume. There is a very small left pleural effusion. Small amount of ascites in the peritoneal cavity shows similar appearance to the prior study. IMPRESSION: VASCULAR Stable appearance of residual chronic dissection involving the distal aortic arch and descending thoracic aorta. No significant increased caliber of the  descending aorta which measures 4.3 cm in maximum diameter. No evidence of acute hemorrhage. Dissection again terminates at the level of the mid descending thoracic aorta and does not extend into the abdomen. NON-VASCULAR Some interval enlargement in size of a right pleural effusion. There is a small left pleural effusion. Small amount of ascites in the peritoneal cavity shows similar appearance to the prior study. Electronically Signed   By: Aletta Edouard M.D.   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 Now with chest pain, hypertension and edema involving the left side of the body. Iodinated contrast and gadolinium cannot be administered due to significant renal failure. EXAM: MRA CHEST WITH OR WITHOUT CONTRAST; MRA ABDOMEN WITH OR WITHOUT CONTRAST TECHNIQUE: Angiographic images of the chest were obtained using MRA technique without intravenous contrast. CONTRAST:  None COMPARISON:  10/20/2017 FINDINGS: VASCULAR Aorta: Stable appearance of the thoracic aorta with prior repair of the ascending thoracic aorta. Residual chronic dissection which begins in the distal arch has a stable appearance. Maximum of the distal arch and proximal descending thoracic aorta is 4.3 cm and is stable. Intimal flap extends again to the level of the mid descending thoracic aorta. No evidence of significant enlargement of the false lumen or acute hemorrhage. Heart: Stable cardiac enlargement and evidence of left ventricular hypertrophy. Trace pericardial fluid. Pulmonary Arteries:  Normal caliber central pulmonary arteries. Other: The abdominal aorta and its branches remain unremarkable without evidence of aneurysm or dissection. NON-VASCULAR There is some interval enlargement in size of a right pleural effusion which  remains still of fairly small volume. There is a very small left pleural effusion. Small amount of ascites in the peritoneal cavity shows similar appearance to the prior study. IMPRESSION: VASCULAR Stable appearance of residual chronic dissection involving the distal aortic arch and descending thoracic aorta. No significant increased caliber of the descending aorta which measures 4.3 cm in maximum diameter. No evidence of acute hemorrhage. Dissection again terminates at the level of the mid descending thoracic aorta and does not extend into the abdomen. NON-VASCULAR Some interval enlargement in size of a right pleural effusion. There is a small left pleural effusion. Small amount of ascites in the peritoneal cavity shows similar appearance to the prior study. Electronically Signed   By: Aletta Edouard M.D.   On: 11/29/2017 17:54     CBC Recent Labs  Lab 12/17/17 1133 12/17/17 1139 12/18/17 0124  WBC 4.1  --  5.3  HGB 11.0* 11.6* 10.6*  HCT 33.5* 34.0* 31.5*  PLT 192  --  266  MCV  90.1  --  90.0  MCH 29.6  --  30.3  MCHC 32.8  --  33.7  RDW 17.5*  --  17.9*  LYMPHSABS 1.0  --  1.3  MONOABS 0.3  --  0.6  EOSABS 0.3  --  0.5  BASOSABS 0.1  --  0.1    Chemistries  Recent Labs  Lab 12/17/17 1133 12/17/17 1139 12/18/17 0124  NA 137 139 139  K 3.7 4.3 5.8*  CL 107 110 108  CO2 18*  --  21*  GLUCOSE 92 90 123*  BUN 50* 54* 50*  CREATININE 6.01* 6.30* 6.07*  CALCIUM 8.3*  --  7.9*  MG  --   --  2.4  AST 18  --  34  ALT 16  --  19  ALKPHOS 166*  --  147*  BILITOT 0.9  --  1.3*   ------------------------------------------------------------------------------------------------------------------ No results for input(s): CHOL, HDL, LDLCALC, TRIG, CHOLHDL, LDLDIRECT in the last 72 hours.  Lab Results  Component Value Date   HGBA1C 5.2 10/20/2017   ------------------------------------------------------------------------------------------------------------------ Recent Labs     12/17/17 1849  TSH 2.935   ------------------------------------------------------------------------------------------------------------------ No results for input(s): VITAMINB12, FOLATE, FERRITIN, TIBC, IRON, RETICCTPCT in the last 72 hours.  Coagulation profile No results for input(s): INR, PROTIME in the last 168 hours.  No results for input(s): DDIMER in the last 72 hours.  Cardiac Enzymes Recent Labs  Lab 12/17/17 1849 12/18/17 0124 12/18/17 0639  TROPONINI 0.04* 0.04* 0.05*   ------------------------------------------------------------------------------------------------------------------    Component Value Date/Time   BNP >4,500.0 (H) 09/12/2017 1531     Oliviarose Punch M.D on 12/18/2017 at 5:36 PM   Go to [email or web address] - password [email or web address] for contact info  [facility] - Office  [phone]

## 2017-12-18 NOTE — Progress Notes (Signed)
Received patient from ICU, VS obtained, Telemetry monitor applied, agree with previous RN assessment, oriented to unit, call light placed in reach

## 2017-12-19 LAB — BASIC METABOLIC PANEL
ANION GAP: 11 (ref 5–15)
BUN: 52 mg/dL — ABNORMAL HIGH (ref 6–20)
CALCIUM: 7.8 mg/dL — AB (ref 8.9–10.3)
CO2: 21 mmol/L — ABNORMAL LOW (ref 22–32)
Chloride: 110 mmol/L (ref 98–111)
Creatinine, Ser: 6.22 mg/dL — ABNORMAL HIGH (ref 0.44–1.00)
GFR calc Af Amer: 10 mL/min — ABNORMAL LOW (ref >60)
GFR, EST NON AFRICAN AMERICAN: 8 mL/min — AB (ref >60)
GLUCOSE: 110 mg/dL — AB (ref 70–99)
POTASSIUM: 3.7 mmol/L (ref 3.5–5.1)
SODIUM: 142 mmol/L (ref 135–145)

## 2017-12-19 LAB — URINE CULTURE: Culture: NO GROWTH

## 2017-12-19 LAB — GLUCOSE, CAPILLARY: GLUCOSE-CAPILLARY: 90 mg/dL (ref 70–99)

## 2017-12-19 MED ORDER — LABETALOL HCL 200 MG PO TABS
200.0000 mg | ORAL_TABLET | Freq: Three times a day (TID) | ORAL | Status: DC
  Administered 2017-12-19 – 2017-12-23 (×13): 200 mg via ORAL
  Filled 2017-12-19 (×13): qty 1

## 2017-12-19 MED ORDER — ISOSORBIDE MONONITRATE ER 60 MG PO TB24
60.0000 mg | ORAL_TABLET | Freq: Every day | ORAL | Status: DC
  Administered 2017-12-19 – 2017-12-21 (×3): 60 mg via ORAL
  Filled 2017-12-19 (×3): qty 1

## 2017-12-19 MED ORDER — CLONIDINE HCL 0.2 MG PO TABS
0.2000 mg | ORAL_TABLET | Freq: Once | ORAL | Status: AC
  Administered 2017-12-19: 0.2 mg via ORAL
  Filled 2017-12-19: qty 1

## 2017-12-19 NOTE — Progress Notes (Signed)
Patient Demographics:    Peri Kreft, is a 32 y.o. female, DOB - 07/08/1985, YFV:[id number]  Admit date - 12/17/2017   Admitting Physician Kerney Elbe, DO  Outpatient Primary MD for the patient is Patient, No Pcp Per  LOS - 2   Chief Complaint  Patient presents with  . 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, chronic combined CHF, chronic kidney disease stage IV/V, TTP, hypertension, Polysubstance abuse including cocaine abuse, tobacco abuse admitted on 12/17/2017 with hypertensive urgency in the setting of acute cocaine use and noncompliance with medications   Plan:- 1) hypertensive emergency--given acute cocaine use will avoid beta-blockers, patient was on a Cardene drip, Now off Cardene drip, blood pressure this morning was 203/132, repeat 207/106, repeat later was 195/114, c/n amlodipine 10 mg daily, increase isosorbide to 60 mg daily, increase hydralazine 200 mg 3 times daily, give labetalol 200 mg 3 times daily, Patient is notoriously noncompliant  2)AKI----acute kidney injury on CKD stage IV -     creatinine on admission= 6.30,   baseline creatinine = 5.9   , creatinine is now= 6.0 , continue bicarb tablets ,  Avoid nephrotoxic agents/dehydration/hypotension, will need to discuss with nephrology service regarding possible AV  graft placement in anticipation of hemodialysis in the near future  3)HFpEF--- last known EF 55 to 60%, suspect acute exacerbation of underlying chronic systolic dysfunction CHF due to uncontrolled blood pressure and stimulant/cocaine use, IV Lasix 120 mg twice daily as ordered monitor daily weights and fluid balance closely,    4) polysubstance abuse including cocaine--UDS positive for cocaine, drug rehab advised patient is not ready discharge this time  5)Hx of Aortic Dissection -Recent MRI showed it was stable -CXR today showed Stable cardiomegaly. Overall cardiomediastinal silhouette is stable, with configuration compatible with patient's known chronic dissection involving the distal aortic arch and descending thoracic aorta (most recently demonstrated on chest MRI dated 11/29/2017). Lungs are clear. No pleural effusion or pneumothorax seen. No acute or suspicious osseous finding.    Code Status : full    Disposition Plan  : home   Consults  :  Nephrology   DVT Prophylaxis  :  - Heparin   Lab Results  Component Value Date   PLT 266 12/18/2017    Inpatient Medications  Scheduled Meds: . amLODipine  10 mg Oral Daily  . aspirin  325 mg Oral Daily  . calcitRIOL  0.25 mcg Oral Daily  . heparin  5,000 Units Subcutaneous Q8H  . hydrALAZINE  100 mg Oral TID  . isosorbide mononitrate  60 mg Oral Daily  . labetalol  200 mg Oral TID  .  sodium bicarbonate  650 mg Oral BID   Continuous Infusions: . furosemide 62 mL/hr at 12/19/17 1250   PRN Meds:.acetaminophen **OR** acetaminophen, ondansetron **OR** ondansetron (ZOFRAN) IV, senna-docusate    Anti-infectives (From admission, onward)   None        Objective:   Vitals:   12/19/17 0518 12/19/17 0624 12/19/17 0928 12/19/17 1309  BP: (!) 203/132 (!) 207/106 (!) 195/114 (!) 156/84  Pulse: (!) 103 (!) 109  74  Resp: 20   20  Temp: 98 F (36.7 C)   97.7 F (36.5 C)  TempSrc: Oral   Oral  SpO2: 97%   100%  Weight:       Height:        Wt Readings from Last 3 Encounters:  12/18/17 66.3 kg (146 lb 2.6 oz)  12/03/17 60.8 kg (134 lb)  10/25/17 55.4 kg (122 lb 1.6 oz)     Intake/Output Summary (Last 24 hours) at 12/19/2017 1544 Last data filed at 12/19/2017 1130 Gross per 24 hour  Intake 302 ml  Output -  Net 302 ml     Physical Exam  Gen:- Awake Alert,  In no apparent distress  HEENT:- [place].AT, No sclera icterus Neck-Supple Neck,No JVD,.  Lungs-  diminished in bases, no wheezing CV- S1, S2 normal Abd-  +ve B.Sounds, Abd Soft, No tenderness,    Extremity/Skin:-Good pulses, negative Homans  psych-affect is appropriate, oriented x3 Neuro-no new focal deficits, no tremors   Data Review:   Micro Results Recent Results (from the past 240 hour(s))  Urine culture     Status: None   Collection Time: 12/17/17  6:34 PM  Result Value Ref Range Status   Specimen Description   Final    URINE, RANDOM Performed at [facility] [address], [place], [place] [address]    Special Requests   Final    NONE Performed at [facility], [place] [address] [place], [place] [address]    Culture   Final    NO GROWTH Performed at [facility], [place] [address], [place], [place] [address]    Report Status 12/19/2017 FINAL  Final    Radiology Reports X-ray Chest Pa And Lateral  Result Date: 12/18/2017 CLINICAL DATA:  Increased shortness of breath for 2 days. EXAM: CHEST - 2 VIEW COMPARISON:  A3 2019 FINDINGS: Sequelae of sternotomy are again identified. The cardiac silhouette remains enlarged. Stable aortic prominence corresponds to the known chronic dissection. There is new patchy airspace opacity in the right lung base, and there is a new small right pleural effusion. The left lung remains clear. No pneumothorax or acute osseous abnormality is identified. IMPRESSION: New small right pleural effusion with right basilar airspace opacity which may reflect pneumonia or  atelectasis. Electronically Signed   By: Logan Bores M.D.   On: 12/18/2017 11:16   Dg Chest 2 View  Result Date: 12/17/2017 CLINICAL DATA:  Shortness of breath this morning. EXAM: CHEST - 2 VIEW COMPARISON:  Chest x-ray dated 09/13/2017. FINDINGS: Stable cardiomegaly. Overall cardiomediastinal silhouette is stable, with configuration compatible with patient's known chronic dissection involving the distal aortic arch and descending thoracic aorta (most recently demonstrated on chest MRI dated 11/29/2017). Lungs are clear. No pleural effusion or pneumothorax seen. No acute or suspicious osseous finding. IMPRESSION: No active cardiopulmonary disease. No evidence of pneumonia or pulmonary edema. Stable chronic findings detailed above. Electronically Signed   By: Franki Cabot M.D.   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 Now with chest pain, hypertension and edema involving the left side of the body. Iodinated contrast and gadolinium cannot be administered due to significant renal failure. EXAM: MRA CHEST WITH OR WITHOUT CONTRAST; MRA ABDOMEN WITH OR WITHOUT CONTRAST TECHNIQUE: Angiographic images of the chest were obtained using MRA technique without intravenous contrast. CONTRAST:  None COMPARISON:  10/20/2017 FINDINGS: VASCULAR Aorta: Stable appearance of the thoracic aorta with prior repair of the ascending thoracic aorta. Residual chronic dissection which begins in the distal arch has a stable appearance. Maximum of the distal arch and proximal descending thoracic aorta is 4.3 cm and is stable. Intimal flap extends again to the level of the mid descending thoracic aorta. No evidence of significant enlargement of the false lumen or acute hemorrhage. Heart: Stable cardiac enlargement and evidence  of left ventricular hypertrophy. Trace pericardial fluid. Pulmonary Arteries:  Normal caliber central pulmonary arteries. Other: The abdominal aorta and its branches remain unremarkable without evidence of aneurysm or dissection. NON-VASCULAR There is some interval enlargement in size of a right pleural effusion which remains still of fairly small volume. There is a very small left pleural effusion. Small amount of ascites in the peritoneal cavity shows similar appearance to the prior study. IMPRESSION: VASCULAR Stable appearance of residual chronic dissection involving the distal aortic arch and descending thoracic aorta. No significant increased caliber of the descending aorta which measures 4.3 cm in maximum diameter. No evidence of acute hemorrhage. Dissection again terminates at the level of the mid descending thoracic aorta and does not extend into the abdomen. NON-VASCULAR Some interval enlargement in size of a right pleural effusion. There is a small left pleural effusion. Small amount of ascites in the peritoneal cavity shows similar appearance to the prior study. Electronically Signed   By: Aletta Edouard M.D.   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 Now with chest pain, hypertension and edema involving the left side of the body. Iodinated contrast and gadolinium cannot be administered due to significant renal failure. EXAM: MRA CHEST WITH OR WITHOUT CONTRAST; MRA ABDOMEN WITH OR WITHOUT CONTRAST TECHNIQUE: Angiographic images of the chest were obtained using MRA technique without intravenous contrast. CONTRAST:  None COMPARISON:  10/20/2017 FINDINGS: VASCULAR Aorta: Stable appearance of the thoracic aorta with prior repair of the ascending thoracic aorta. Residual chronic dissection  which begins in the distal arch has a stable appearance. Maximum of the distal arch and proximal descending thoracic aorta is 4.3 cm and is stable. Intimal flap extends again to the level of the mid descending thoracic aorta. No evidence of significant enlargement of the false lumen or acute hemorrhage. Heart: Stable cardiac enlargement and evidence of left ventricular hypertrophy. Trace pericardial fluid. Pulmonary Arteries:  Normal caliber central pulmonary arteries. Other: The abdominal aorta and its branches remain unremarkable without evidence of aneurysm or dissection. NON-VASCULAR There is some interval enlargement in size of a right pleural effusion which remains still of fairly small volume. There is a very small left pleural effusion. Small amount of ascites in the peritoneal cavity shows similar appearance to the prior study. IMPRESSION: VASCULAR Stable appearance of  residual chronic dissection involving the distal aortic arch and descending thoracic aorta. No significant increased caliber of the descending aorta which measures 4.3 cm in maximum diameter. No evidence of acute hemorrhage. Dissection again terminates at the level of the mid descending thoracic aorta and does not extend into the abdomen. NON-VASCULAR Some interval enlargement in size of a right pleural effusion. There is a small left pleural effusion. Small amount of ascites in the peritoneal cavity shows similar appearance to the prior study. Electronically Signed   By: Aletta Edouard M.D.   On: 11/29/2017 17:54     CBC Recent Labs  Lab 12/17/17 1133 12/17/17 1139 12/18/17 0124  WBC 4.1  --  5.3  HGB 11.0* 11.6* 10.6*  HCT 33.5* 34.0* 31.5*  PLT 192  --  266  MCV 90.1  --  90.0  MCH 29.6  --  30.3  MCHC 32.8  --  33.7  RDW 17.5*  --  17.9*  LYMPHSABS 1.0  --  1.3  MONOABS 0.3  --  0.6  EOSABS 0.3  --  0.5  BASOSABS 0.1  --  0.1    Chemistries  Recent Labs  Lab 12/17/17 1133 12/17/17 1139 12/18/17 0124  NA 137  139 139  K 3.7 4.3 5.8*  CL 107 110 108  CO2 18*  --  21*  GLUCOSE 92 90 123*  BUN 50* 54* 50*  CREATININE 6.01* 6.30* 6.07*  CALCIUM 8.3*  --  7.9*  MG  --   --  2.4  AST 18  --  34  ALT 16  --  19  ALKPHOS 166*  --  147*  BILITOT 0.9  --  1.3*   ------------------------------------------------------------------------------------------------------------------ No results for input(s): CHOL, HDL, LDLCALC, TRIG, CHOLHDL, LDLDIRECT in the last 72 hours.  Lab Results  Component Value Date   HGBA1C 5.2 10/20/2017   ------------------------------------------------------------------------------------------------------------------ Recent Labs    12/17/17 1849  TSH 2.935   ------------------------------------------------------------------------------------------------------------------ No results for input(s): VITAMINB12, FOLATE, FERRITIN, TIBC, IRON, RETICCTPCT in the last 72 hours.  Coagulation profile No results for input(s): INR, PROTIME in the last 168 hours.  No results for input(s): DDIMER in the last 72 hours.  Cardiac Enzymes Recent Labs  Lab 12/17/17 1849 12/18/17 0124 12/18/17 0639  TROPONINI 0.04* 0.04* 0.05*   ------------------------------------------------------------------------------------------------------------------    Component Value Date/Time   BNP >4,500.0 (H) 09/12/2017 1531     Teddrick Mallari M.D on 12/19/2017 at 3:44 PM   Go to [email or web address] - password [email or web address] for contact info  [facility] - Office  [phone]

## 2017-12-20 ENCOUNTER — Inpatient Hospital Stay (HOSPITAL_COMMUNITY): Payer: Medicaid Other

## 2017-12-20 DIAGNOSIS — I10 Essential (primary) hypertension: Secondary | ICD-10-CM

## 2017-12-20 DIAGNOSIS — N184 Chronic kidney disease, stage 4 (severe): Secondary | ICD-10-CM

## 2017-12-20 DIAGNOSIS — I160 Hypertensive urgency: Secondary | ICD-10-CM

## 2017-12-20 LAB — GLUCOSE, CAPILLARY: Glucose-Capillary: 93 mg/dL (ref 70–99)

## 2017-12-20 MED ORDER — SODIUM CHLORIDE 0.9 % IV SOLN
INTRAVENOUS | Status: DC | PRN
  Administered 2017-12-20 (×2): 250 mL via INTRAVENOUS
  Administered 2017-12-23 (×2): via INTRAVENOUS

## 2017-12-20 MED ORDER — HYDRALAZINE HCL 20 MG/ML IJ SOLN
5.0000 mg | Freq: Once | INTRAMUSCULAR | Status: AC
  Administered 2017-12-20: 5 mg via INTRAVENOUS
  Filled 2017-12-20: qty 1

## 2017-12-20 NOTE — Progress Notes (Signed)
Bilateral upper extremity vein mapping has been completed.    12/20/17 11:40 AM Carlos Levering RVT

## 2017-12-20 NOTE — Progress Notes (Signed)
Patient Demographics:    Valerie Mathis, is a 32 y.o. female, DOB - Sep 13, 1985, ZES:[id number]  Admit date - 12/17/2017   Admitting Physician Kerney Elbe, DO  Outpatient Primary MD for the patient is Patient, No Pcp Per  LOS - 3   Chief Complaint  Patient presents with  . Allergic Reaction        Subjective:    Valerie Mathis today has no fevers, no emesis,  No chest pain, no sob, denies  palpitations or dizziness, no shortness of breath at rest, blood pressure is much better (systolic BP was over 263 mmhg and DBP was over 130 mmhg on 12/19/17 - (much improved at this time).....   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 given acute cocaine use and on-going cocaine use , we  will avoid beta-blockers, patient was on a Cardene drip, has been off Cardene drip,   c/n amlodipine 10 mg daily,c/n isosorbide  60 mg daily, c/n  hydralazine 100 mg 3 times daily and labetalol 200 mg 3 times daily, Patient is notoriously noncompliant, she will need medication assistance upon discharge in order to be able to afford her BP  medications and avoid readmission  2)AKI (acute kidney injury)  superimposed on CKD stage IV Versus progressive CKD now in CKD stage V -     creatinine on admission= 6.30,   baseline creatinine = 5.9   , creatinine is now= 6.2 , continue bicarb tablets ,  Avoid nephrotoxic agents/dehydration/hypotension,  discussed with Dr. Jonnie Finner from nephrology service, nephrology consult appreciated, also discussed with Dr. Sherren Mocha Early from vascular surgery on 12/19/2017 , vein mapping completed on 12/20/2017 ,  patient to be transferred to [facility] on 12/20/2017 with possible permanent hemodialysis access being placed by vascular team in anticipation of hemodialysis in the near future prior to discharge from the hospital this week.  Patient is notoriously noncompliant, patient previously missed outpatient appointments with [facility] at least twice in the last few weeks, also patient's phone numbers were not working so nephrology service were unable to reach patient post discharge from hospital last time  3)HFpEF--- last known EF 55 to 60%, suspect acute exacerbation of underlying chronic systolic dysfunction CHF due to uncontrolled blood pressure and stimulant/cocaine use, volume status has improved with IV Lasix 120 mg twice daily as ordered monitor daily weights and fluid balance closely, weight is not available today, however  on admission patient was at least 6 kg above her previous weight.  Fluid balance charting appears to be inaccurate also  4)Polysubstance Abuse including cocaine--UDS positive for cocaine, drug rehab advised patient is not ready to do this at this time  5)Hx of Aortic Dissection- -Recent MRI showed it was stable, -CXR this admission showed Stable cardiomegaly. Overall cardiomediastinal silhouette is stable, with configuration compatible with patient's known chronic dissection involving the distal aortic arch and descending thoracic aorta (most recently demonstrated on chest MRI dated 11/29/2017).  No pleural effusion or pneumothorax  seen. No acute or suspicious osseous finding.  Disposition--please call nephrology team when patient arrives at [facility] please also call vascular surgery consult for permanent hemodialysis access placement when patient arrives at [place] Status : full   Disposition Plan  : home   Consults  :  Nephrology   DVT Prophylaxis  :  - Heparin   Lab Results  Component Value Date   PLT 266 12/18/2017    Inpatient Medications  Scheduled Meds: . amLODipine  10 mg Oral Daily  . aspirin  325 mg Oral Daily  . calcitRIOL  0.25 mcg Oral Daily  . heparin  5,000 Units Subcutaneous Q8H  . hydrALAZINE  100 mg Oral TID  . isosorbide mononitrate  60 mg Oral Daily  . labetalol  200 mg Oral TID  . sodium bicarbonate  650 mg Oral BID   Continuous Infusions: . sodium chloride 250 mL (12/20/17 1208)  . furosemide 120 mg (12/20/17 1208)   PRN Meds:.sodium chloride, acetaminophen **OR** acetaminophen, ondansetron **OR** ondansetron (ZOFRAN) IV, senna-docusate    Anti-infectives (From admission, onward)   None        Objective:   Vitals:   12/19/17 1822 12/19/17 2127 12/20/17 0458 12/20/17 1245  BP: (!) 160/87 (!) 148/100 (!) 168/111 132/81  Pulse: 81 81 88 73  Resp:  18 18 18   Temp:  97.9 F (36.6 C) 98 F (36.7 C) 97.9 F (36.6 C)  TempSrc:  Oral Oral Oral  SpO2:  100% 97% 100%  Weight:      Height:        Wt Readings from Last 3 Encounters:  12/18/17 66.3 kg (146 lb 2.6 oz)  12/03/17 60.8 kg (134 lb)  10/25/17 55.4 kg (122 lb 1.6 oz)     Intake/Output Summary (Last 24 hours) at 12/20/2017 1330 Last data filed at 12/20/2017 1208 Gross per 24 hour  Intake 444.33 ml  Output -  Net 444.33 ml     Physical Exam  Gen:- Awake Alert,  In no apparent distress  HEENT:- [place].AT, No sclera icterus Neck-Supple Neck,No JVD,.  Lungs-  diminished in bases, no wheezing CV- S1, S2 normal Abd-  +ve B.Sounds, Abd Soft, No tenderness,    Extremity/Skin:-No  edema, good pulses, negative Homans  psych-affect is appropriate, oriented x3 Neuro-no new focal deficits, no tremors   Data Review:   Micro Results Recent Results (from the past 240 hour(s))  Urine culture     Status: None   Collection Time: 12/17/17  6:34 PM  Result Value Ref Range Status   Specimen Description   Final    URINE, RANDOM Performed at [facility] [address] [place], [place] [address]    Special Requests   Final    NONE Performed at [facility], [place] [address], [place], [place] [address]    Culture   Final    NO GROWTH Performed at [facility], [place] [address], [place], [place] [address]    Report Status 12/19/2017 FINAL  Final  Radiology Reports X-ray Chest Pa And Lateral  Result Date: 12/18/2017 CLINICAL DATA:  Increased shortness of breath for 2 days. EXAM: CHEST - 2 VIEW COMPARISON:  A3 2019 FINDINGS: Sequelae of sternotomy are again identified. The cardiac silhouette remains enlarged. Stable aortic prominence corresponds to the known chronic dissection. There is new patchy airspace opacity in the right lung base, and there is a new small right pleural effusion. The left lung remains clear. No pneumothorax or acute osseous abnormality is identified. IMPRESSION: New small right pleural effusion with right basilar airspace opacity which may reflect pneumonia or atelectasis. Electronically Signed   By: Logan Bores M.D.   On: 12/18/2017 11:16   Dg Chest 2 View  Result Date: 12/17/2017 CLINICAL DATA:  Shortness of breath this morning. EXAM: CHEST - 2 VIEW COMPARISON:  Chest x-ray dated 09/13/2017. FINDINGS: Stable cardiomegaly. Overall cardiomediastinal silhouette is stable, with configuration compatible with patient's known chronic dissection involving the distal aortic arch and descending thoracic aorta (most recently demonstrated on chest MRI dated 11/29/2017). Lungs are clear. No pleural effusion or pneumothorax  seen. No acute or suspicious osseous finding. IMPRESSION: No active cardiopulmonary disease. No evidence of pneumonia or pulmonary edema. Stable chronic findings detailed above. Electronically Signed   By: Franki Cabot M.D.   On: 12/17/2017 12:13   Mr Jodene Nam Chest Wo Contrast  Result Date: 11/29/2017 CLINICAL DATA:  History prior thoracic aortic dissection with repair of ascending thoracic aortic dissection and 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 Now with chest pain, hypertension and edema involving the left side of the body. Iodinated contrast and gadolinium cannot be administered due to significant renal failure. EXAM: MRA CHEST WITH OR WITHOUT CONTRAST; MRA ABDOMEN WITH OR WITHOUT CONTRAST TECHNIQUE: Angiographic images of the chest were obtained using MRA technique without intravenous contrast. CONTRAST:  None COMPARISON:  10/20/2017 FINDINGS: VASCULAR Aorta: Stable appearance of the thoracic aorta with prior repair of the ascending thoracic aorta. Residual chronic dissection which begins in the distal arch has a stable appearance. Maximum of the distal arch and proximal descending thoracic aorta is 4.3 cm and is stable. Intimal flap extends again to the level of the mid descending thoracic aorta. No evidence of significant enlargement of the false lumen or acute hemorrhage. Heart: Stable cardiac enlargement and evidence of left ventricular hypertrophy. Trace pericardial fluid. Pulmonary Arteries:  Normal caliber central pulmonary arteries. Other: The abdominal aorta and its branches remain unremarkable without evidence of aneurysm or dissection. NON-VASCULAR There is some interval enlargement in size of a right pleural effusion which remains still of fairly small volume. There is a very small left pleural effusion. Small amount of ascites in the peritoneal cavity shows similar appearance to the prior study. IMPRESSION: VASCULAR Stable appearance of residual chronic dissection  involving the distal aortic arch and descending thoracic aorta. No significant increased caliber of the descending aorta which measures 4.3 cm in maximum diameter. No evidence of acute hemorrhage. Dissection again terminates at the level of the mid descending thoracic aorta and does not extend into the abdomen. NON-VASCULAR Some interval enlargement in size of a right pleural effusion. There is a small left pleural effusion. Small amount of ascites in the peritoneal cavity shows similar appearance to the prior study. Electronically Signed   By: Aletta Edouard M.D.   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 Now with chest pain, hypertension and edema involving the left side of the body. Iodinated contrast and gadolinium cannot be administered due to significant renal failure. EXAM: MRA CHEST WITH OR WITHOUT CONTRAST; MRA ABDOMEN WITH OR WITHOUT CONTRAST TECHNIQUE: Angiographic images of the chest were obtained using MRA technique without intravenous contrast. CONTRAST:  None COMPARISON:  10/20/2017 FINDINGS: VASCULAR Aorta: Stable appearance of the thoracic aorta with prior repair of the ascending thoracic aorta. Residual chronic dissection which begins in the distal arch has a stable appearance. Maximum of the distal arch and proximal descending thoracic aorta is 4.3 cm and is stable. Intimal flap extends again to the level of the mid descending thoracic aorta. No evidence of significant enlargement of the false lumen or acute hemorrhage. Heart: Stable cardiac enlargement and evidence of left ventricular hypertrophy. Trace pericardial fluid. Pulmonary Arteries:  Normal caliber central pulmonary arteries. Other: The abdominal aorta and its branches remain unremarkable without evidence of aneurysm or  dissection. NON-VASCULAR There is some interval enlargement in size of a right pleural effusion which remains still of fairly small volume. There is a very small left pleural effusion. Small amount of ascites in the peritoneal cavity shows similar appearance to the prior study. IMPRESSION: VASCULAR Stable appearance of residual chronic dissection involving the distal aortic arch and descending thoracic aorta. No significant increased caliber of the descending aorta which measures 4.3 cm in maximum diameter. No evidence of acute hemorrhage. Dissection again terminates at the level of the mid descending thoracic aorta and does not extend into the abdomen. NON-VASCULAR Some interval enlargement in size of a right pleural effusion. There is a small left pleural effusion. Small amount of ascites in the peritoneal cavity shows similar appearance to the prior study. Electronically Signed   By: Aletta Edouard M.D.   On: 11/29/2017 17:54     CBC Recent Labs  Lab 12/17/17 1133 12/17/17 1139 12/18/17 0124  WBC 4.1  --  5.3  HGB 11.0* 11.6* 10.6*  HCT 33.5* 34.0* 31.5*  PLT 192  --  266  MCV 90.1  --  90.0  MCH 29.6  --  30.3  MCHC 32.8  --  33.7  RDW 17.5*  --  17.9*  LYMPHSABS 1.0  --  1.3  MONOABS 0.3  --  0.6  EOSABS 0.3  --  0.5  BASOSABS 0.1  --  0.1    Chemistries  Recent Labs  Lab 12/17/17 1133 12/17/17 1139 12/18/17 0124 12/19/17 1613  NA 137 139 139 142  K 3.7 4.3 5.8* 3.7  CL 107 110 108 110  CO2 18*  --  21* 21*  GLUCOSE 92 90 123* 110*  BUN 50* 54* 50* 52*  CREATININE 6.01* 6.30* 6.07* 6.22*  CALCIUM 8.3*  --  7.9* 7.8*  MG  --   --  2.4  --   AST 18  --  34  --   ALT 16  --  19  --   ALKPHOS 166*  --  147*  --   BILITOT 0.9  --  1.3*  --    ------------------------------------------------------------------------------------------------------------------ No results for input(s): CHOL, HDL, LDLCALC, TRIG, CHOLHDL, LDLDIRECT in the last 72 hours.  Lab Results    Component Value Date   HGBA1C 5.2 10/20/2017   ------------------------------------------------------------------------------------------------------------------ Recent Labs    12/17/17 1849  TSH 2.935   ------------------------------------------------------------------------------------------------------------------ No results for input(s): VITAMINB12, FOLATE, FERRITIN, TIBC, IRON, RETICCTPCT in the  last 72 hours.  Coagulation profile No results for input(s): INR, PROTIME in the last 168 hours.  No results for input(s): DDIMER in the last 72 hours.  Cardiac Enzymes Recent Labs  Lab 12/17/17 1849 12/18/17 0124 12/18/17 0639  TROPONINI 0.04* 0.04* 0.05*   ------------------------------------------------------------------------------------------------------------------    Component Value Date/Time   BNP >4,500.0 (H) 09/12/2017 1531     Bernese Doffing M.D on 12/20/2017 at 1:30 PM   Go to [email or web address] - password [email or web address] for contact info  [facility] - Office  [phone]

## 2017-12-20 NOTE — Consult Note (Signed)
Renal Service Consult Note [facility]  Valerie Mathis 12/20/2017 [place] Requesting Physician:  Dr Maurene Capes  Reason for Consult:  CKD V patient , HTN crisis HPI: The patient is a 32 y.o. year-old with hx of HTN, drug abuse and progressive CKD, now stage V.  She was admitted for HTN'sive urgency and SOB on 8/3, treated w/ IV and oral BP medications.  +UDS for cocaine.  SOB and BP are better, BP's 160/100 now.  Asked to see for CKD.    Pt was seen first here in April 2019 w/ creat 4, and again a few wks ago w/ Cr 5's, now creat is low 6's.  No uremic symptoms, appetite is good.  No sob or CP now.    Patient missed two appts at OP office [facility], financial constraints per pateint.  She has applied for Medicaid waiting to here back.  Has no insurance.  She understands she will be on dialysis soon and wants to go ahead w/ access surgery and dialysis when needed.    ROS  denies CP  no joint pain   no HA  no blurry vision  no rash  no diarrhea  no nausea/ vomiting  no dysuria  no difficulty voiding  no change in urine color    Past Medical History  Past Medical History:  Diagnosis Date  . Aortic dissection ([place])   . CHF (congestive heart failure) ([place])   . CKD (chronic kidney disease) stage 4, GFR 15-29 ml/min ([facility])   . H/O: alcohol abuse   . Hypertension   . Renal disorder   . 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 ECTOPIC PREGNANCY SURGERY  2018  . REPAIR OF ACUTE ASCENDING THORACIC AORTIC DISSECTION     Family History  Family History  Problem Relation Age of Onset  . Hypertension Mother    Social History  reports that she has been smoking cigarettes.  She has a 2.00 pack-year smoking history. She has never used smokeless tobacco. She reports that she drinks alcohol. She reports that she has current or past drug history. Drugs: Cocaine and Benzodiazepines. Allergies  No Known Allergies Home medications Prior to Admission medications   Medication Sig Start Date End Date Taking? Authorizing Provider  amLODipine (NORVASC) 10 MG tablet Take 1 tablet (10 mg total) by mouth daily. for high blood pressure 12/03/17  Yes Lavina Hamman, MD  carvedilol (COREG) 25 MG tablet Take 1 tablet (25 mg total) by mouth 2 (two) times daily with a meal. 12/03/17  Yes Lavina Hamman, MD  cloNIDine (CATAPRES) 0.2 MG tablet Take 1 tablet (0.2 mg total) by mouth 3 (three) times daily. 12/03/17  Yes Lavina Hamman, MD  furosemide (LASIX) 80 MG tablet Take 1 tablet (80 mg total) by mouth 2 (two) times daily. 12/03/17  Yes Lavina Hamman, MD  hydrALAZINE (APRESOLINE) 100 MG tablet Take 1 tablet (100 mg total) by mouth 3 (three) times daily. 12/03/17  Yes Lavina Hamman, MD  sodium bicarbonate 650 MG tablet Take 650 mg by mouth 2 (two) times daily.    Yes [provider]  calcitRIOL (ROCALTROL) 0.25 MCG capsule Take 1 capsule (0.25 mcg total) by mouth daily. Patient not taking: Reported on 12/17/2017 12/03/17   Lavina Hamman, MD  ferrous sulfate 325 (65 FE) MG tablet Take 1 tablet (325 mg total) by mouth 2 (two) times daily  with a meal. Patient not taking: Reported on 11/29/2017 09/15/17   Damita Lack, MD  polyethylene glycol [facility]) packet Take 17 g by mouth daily as needed for moderate constipation or severe constipation. Patient not taking: Reported on 11/29/2017 10/25/17   Welford Roche, MD   Liver Function Tests Recent Labs  Lab 12/17/17 1133 12/18/17 0124  AST 18 34  ALT 16 19  ALKPHOS 166* 147*  BILITOT 0.9 1.3*  PROT 6.5 5.6*  ALBUMIN 3.7 3.2*   No results for input(s): LIPASE, AMYLASE in the last 168 hours. CBC Recent Labs  Lab 12/17/17 1133 12/17/17 1139 12/18/17 0124  WBC 4.1  --  5.3  NEUTROABS 2.4  --  2.8  HGB 11.0* 11.6* 10.6*  HCT 33.5* 34.0* 31.5*  MCV 90.1  --  90.0  PLT 192  --  329   Basic Metabolic Panel Recent Labs  Lab  12/17/17 1133 12/17/17 1139 12/18/17 0124 12/19/17 1613  NA 137 139 139 142  K 3.7 4.3 5.8* 3.7  CL 107 110 108 110  CO2 18*  --  21* 21*  GLUCOSE 92 90 123* 110*  BUN 50* 54* 50* 52*  CREATININE 6.01* 6.30* 6.07* 6.22*  CALCIUM 8.3*  --  7.9* 7.8*  PHOS  --   --  4.8*  --    Iron/TIBC/Ferritin/ %Sat    Component Value Date/Time   IRON 14 (L) 09/13/2017 1445   TIBC 297 09/13/2017 1445   IRONPCTSAT 5 (L) 09/13/2017 1445    Vitals:   12/19/17 1821 12/19/17 1822 12/19/17 2127 12/20/17 0458  BP: (!) 160/87 (!) 160/87 (!) 148/100 (!) 168/111  Pulse:  81 81 88  Resp:   18 18  Temp:   97.9 F (36.6 C) 98 F (36.7 C)  TempSrc:   Oral Oral  SpO2:   100% 97%  Weight:      Height:       Exam Gen alert no distress No rash, cyanosis or gangrene Sclera anicteric, throat clear  No jvd or bruits Chest clear bilat RRR no MRG Abd soft ntnd no mass or ascites +bs GU defer MS no joint effusions or deformity Ext no LE edema, no wounds or ulcers Neuro is alert, Ox 3 , nf     Impression: 1  HTN urgency - better, cont meds 2  CKD V - due to HTN most likely. Doppler of renal arteries in May didn't show any stenosis.  Not uremic but need to establish HD access. Will have pt transfer to a [facility] at [facility] and will consult [facility] for perm access in house prior to dc.  Ordered vein map.  3  Drug abuse 4  Financial - awaiting medicaid response      Plan - as above  Kelly Splinter MD [employer] pager [phone]   12/20/2017, 8:56 AM

## 2017-12-20 NOTE — Consult Note (Signed)
Hospital Consult    Reason for Consult: Need for dialysis access Referring Physician: Dr. Jonnie Finner MRN #:  [id number]  History of Present Illness: This is a 32 y.o. female with history of type a aortic dissection repair.  She was recently seen by vascular for chronic type B dissection.  She now has elevated creatinine pending dialysis.  We are consulted for permanent access.  She has never had upper extremity surgery.  She is right-hand dominant.  She does not take blood thinners.  Past Medical History:  Diagnosis Date  . Aortic dissection ([place])   . CHF (congestive heart failure) ([place])   . CKD (chronic kidney disease) stage 4, GFR 15-29 ml/min (HCC)   . H/O: alcohol abuse   . Hypertension   . Renal disorder   . 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 ECTOPIC PREGNANCY SURGERY  2018  . REPAIR OF ACUTE ASCENDING THORACIC AORTIC DISSECTION      No Known Allergies  Prior to Admission medications   Medication Sig Start Date End Date Taking? Authorizing Provider  amLODipine (NORVASC) 10 MG tablet Take 1 tablet (10 mg total) by mouth daily. for high blood pressure 12/03/17  Yes Lavina Hamman, MD  carvedilol (COREG) 25 MG tablet Take 1 tablet (25 mg total) by mouth 2 (two) times daily with a meal. 12/03/17  Yes Lavina Hamman, MD  cloNIDine (CATAPRES) 0.2 MG tablet Take 1 tablet (0.2 mg total) by mouth 3 (three) times daily. 12/03/17  Yes Lavina Hamman, MD  furosemide (LASIX) 80 MG tablet Take 1 tablet (80 mg total) by mouth 2 (two) times daily. 12/03/17  Yes Lavina Hamman, MD  hydrALAZINE (APRESOLINE) 100 MG tablet Take 1 tablet (100 mg total) by mouth 3 (three) times daily. 12/03/17  Yes Lavina Hamman, MD  sodium bicarbonate 650 MG tablet Take 650 mg by mouth 2 (two) times daily.    Yes [provider]  calcitRIOL (ROCALTROL) 0.25 MCG capsule Take 1 capsule (0.25 mcg total) by mouth daily. Patient not  taking: Reported on 12/17/2017 12/03/17   Lavina Hamman, MD  ferrous sulfate 325 (65 FE) MG tablet Take 1 tablet (325 mg total) by mouth 2 (two) times daily with a meal. Patient not taking: Reported on 11/29/2017 09/15/17   Damita Lack, MD  polyethylene glycol [facility]) packet Take 17 g by mouth daily as needed for moderate constipation or severe constipation. Patient not taking: Reported on 11/29/2017 10/25/17   Welford Roche, MD    Social History   Socioeconomic History  . Marital status: Single    Spouse name: Not on file  . Number of children: Not on file  . Years of education: Not on file  . Highest education level: Not on file  Occupational History  . Not on file  Social Needs  . Financial resource strain: Not on file  . Food insecurity:    Worry: Not on file    Inability: Not on file  . Transportation needs:    Medical: Not on file    Non-medical: Not on file  Tobacco Use  . Smoking status: Current Every Day Smoker    Packs/day: 0.50    Years: 4.00    Pack years: 2.00    Types: Cigarettes  . Smokeless tobacco: Never Used  Substance and Sexual Activity  . Alcohol use: Yes    Comment: OCCASIONAL  . Drug use: Yes  Types: Cocaine, Benzodiazepines    Comment: Last use 08/2017  . Sexual activity: Yes  Lifestyle  . Physical activity:    Days per week: Not on file    Minutes per session: Not on file  . Stress: Not on file  Relationships  . Social connections:    Talks on phone: Not on file    Gets together: Not on file    Attends religious service: Not on file    Active member of club or organization: Not on file    Attends meetings of clubs or organizations: Not on file    Relationship status: Not on file  . Intimate partner violence:    Fear of current or ex partner: Not on file    Emotionally abused: Not on file    Physically abused: Not on file    Forced sexual activity: Not on file  Other Topics Concern  . Not on file  Social History Narrative   . Not on file     Family History  Problem Relation Age of Onset  . Hypertension Mother     ROS: [x]  Positive   [ ]  Negative   [ ]  All sytems reviewed and are negative  Cardiovascular: []  chest pain/pressure []  palpitations []  SOB lying flat []  DOE []  pain in legs while walking []  pain in legs at rest []  pain in legs at night []  non-healing ulcers []  hx of DVT []  swelling in legs  Pulmonary: []  productive cough []  asthma/wheezing []  home O2  Neurologic: []  weakness in []  arms []  legs []  numbness in []  arms []  legs []  hx of CVA []  mini stroke [] difficulty speaking or slurred speech []  temporary loss of vision in one eye []  dizziness  Hematologic: []  hx of cancer []  bleeding problems []  problems with blood clotting easily  Endocrine:   []  diabetes []  thyroid disease  GI []  vomiting blood []  blood in stool  GU: []  CKD/renal failure []  HD--[]  M/W/F or []  T/T/S []  burning with urination []  blood in urine  Psychiatric: []  anxiety []  depression  Musculoskeletal: []  arthritis []  joint pain  Integumentary: []  rashes []  ulcers  Constitutional: []  fever []  chills   Physical Examination  Vitals:   12/20/17 1245 12/20/17 1246  BP: 132/81   Pulse: 73 73  Resp: 18   Temp: 97.9 F (36.6 C)   SpO2: 100% 100%   Body mass index is 25.89 kg/m.  General:   NAD HENT: WNL, normocephalic Pulmonary: normal non-labored breathing Cardiac: Palpable radial pulses bilaterally Abdomen:  soft, NT/ND, no masses Extremities: No clubbing cyanosis edema, IV present in right upper extremity  Neurologic: A&O X 3; Appropriate Affect ; SENSATION: normal; MOTOR FUNCTION:  moving all extremities equally. Speech is fluent/normal   CBC    Component Value Date/Time   WBC 5.3 12/18/2017 0124   RBC 3.50 (L) 12/18/2017 0124   HGB 10.6 (L) 12/18/2017 0124   HCT 31.5 (L) 12/18/2017 0124   PLT 266 12/18/2017 0124   MCV 90.0 12/18/2017 0124   MCH 30.3 12/18/2017 0124     MCHC 33.7 12/18/2017 0124   RDW 17.9 (H) 12/18/2017 0124   LYMPHSABS 1.3 12/18/2017 0124   MONOABS 0.6 12/18/2017 0124   EOSABS 0.5 12/18/2017 0124   BASOSABS 0.1 12/18/2017 0124    BMET    Component Value Date/Time   NA 142 12/19/2017 1613   K 3.7 12/19/2017 1613   CL 110 12/19/2017 1613   CO2 21 (L) 12/19/2017 1613  GLUCOSE 110 (H) 12/19/2017 1613   BUN 52 (H) 12/19/2017 1613   CREATININE 6.22 (H) 12/19/2017 1613   CALCIUM 7.8 (L) 12/19/2017 1613   GFRNONAA 8 (L) 12/19/2017 1613   GFRAA 10 (L) 12/19/2017 1613    COAGS: Lab Results  Component Value Date   INR 1.19 11/29/2017     Non-Invasive Vascular Imaging:   I have independently interpreted her vein mapping which demonstrates suitable basilic veins bilaterally for dialysis access creation   ASSESSMENT/PLAN: This is a 32 y.o. female with history of type a dissection repair with a chronic descending component.  She is now here with need for dialysis access.  She is right-hand dominant has palpable left radial pulse and apparent basilic vein that is suitable for fistula creation on the left per recent vein mapping.  She needs transferred to [facility] and we can proceed with fistula versus graft later this week.  If she is in need of catheter that time we can place that also.   Cyree Chuong C. Donzetta Matters, MD [facility] Office: [phone] Pager: [phone]

## 2017-12-20 NOTE — Care Management Note (Signed)
Case Management Note  Patient Details  Name: Valerie Mathis MRN: [id number] Date of Birth: 1985/10/22  Subjective/Objective: Pt admitted with HTN.                   Action/Plan: Appointment with Patient Valerie Mathis Aug 14 at 9:30 AM was made for pt. Pt understands that it is important to follow up with this appointment. Pt states she can pay for medications on the $4 list. Encouraged pt to download Good Rx on her smart phone.   Expected Discharge Date:  12/20/17               Expected Discharge Plan:  Home/Self Care  In-House Referral:     Discharge planning Services  CM Consult, Follow-up appt scheduled  Post Acute Care Choice:    Choice offered to:     DME Arranged:    DME Agency:     HH Arranged:    HH Agency:     Status of Service:  Completed, signed off  If discussed at [employer] of Stay Meetings, dates discussed:    Additional CommentsPurcell Mouton, RN 12/20/2017, 10:31 AM

## 2017-12-21 DIAGNOSIS — R0602 Shortness of breath: Secondary | ICD-10-CM

## 2017-12-21 LAB — GLUCOSE, CAPILLARY
GLUCOSE-CAPILLARY: 88 mg/dL (ref 70–99)
Glucose-Capillary: 94 mg/dL (ref 70–99)
Glucose-Capillary: 92 mg/dL (ref 70–99)

## 2017-12-21 MED ORDER — METOCLOPRAMIDE HCL 5 MG/ML IJ SOLN
10.0000 mg | Freq: Once | INTRAMUSCULAR | Status: AC
  Administered 2017-12-21: 10 mg via INTRAVENOUS
  Filled 2017-12-21: qty 2

## 2017-12-21 MED ORDER — DIPHENHYDRAMINE HCL 50 MG/ML IJ SOLN
25.0000 mg | Freq: Once | INTRAMUSCULAR | Status: AC
  Administered 2017-12-21: 25 mg via INTRAVENOUS
  Filled 2017-12-21: qty 1

## 2017-12-21 MED ORDER — CLONIDINE HCL 0.1 MG PO TABS
0.1000 mg | ORAL_TABLET | Freq: Three times a day (TID) | ORAL | Status: DC
  Administered 2017-12-21 – 2017-12-23 (×6): 0.1 mg via ORAL
  Filled 2017-12-21 (×6): qty 1

## 2017-12-21 MED ORDER — ISOSORBIDE MONONITRATE ER 60 MG PO TB24
120.0000 mg | ORAL_TABLET | Freq: Every day | ORAL | Status: DC
  Administered 2017-12-22 – 2017-12-24 (×2): 120 mg via ORAL
  Filled 2017-12-21 (×2): qty 2

## 2017-12-21 MED ORDER — KETOROLAC TROMETHAMINE 30 MG/ML IJ SOLN
30.0000 mg | Freq: Once | INTRAMUSCULAR | Status: AC
  Administered 2017-12-21: 30 mg via INTRAVENOUS
  Filled 2017-12-21: qty 1

## 2017-12-21 NOTE — Progress Notes (Signed)
   Plan is for left arm AV fistula versus graft on Friday with my partner Dr. Donnetta Hutching.  She will need to be n.p.o. past midnight on Thursday.  Danyela Posas C. Donzetta Matters, MD [facility] [place] Office: [phone] Pager: [phone]

## 2017-12-21 NOTE — Progress Notes (Signed)
[facility] Progress Note  Subjective: no c/o  Vitals:   12/20/17 2229 12/21/17 0153 12/21/17 0514 12/21/17 0952  BP:  (!) 171/92 (!) 159/95 129/79  Pulse: 73 83 70 81  Resp:  16 20 16   Temp:  97.6 F (36.4 C) 98.4 F (36.9 C) 97.7 F (36.5 C)  TempSrc:  Axillary Oral Oral  SpO2: 100% 100% 98% 98%  Weight:  60 kg (132 lb 4.4 oz)    Height:        Inpatient medications: . amLODipine  10 mg Oral Daily  . aspirin  325 mg Oral Daily  . calcitRIOL  0.25 mcg Oral Daily  . heparin  5,000 Units Subcutaneous Q8H  . hydrALAZINE  100 mg Oral TID  . isosorbide mononitrate  60 mg Oral Daily  . labetalol  200 mg Oral TID  . sodium bicarbonate  650 mg Oral BID   . sodium chloride Stopped (12/20/17 1932)  . furosemide 120 mg (12/21/17 1022)   sodium chloride, acetaminophen **OR** acetaminophen, ondansetron **OR** ondansetron (ZOFRAN) IV, senna-docusate  Iron/TIBC/Ferritin/ %Sat    Component Value Date/Time   IRON 14 (L) 09/13/2017 1445   TIBC 297 09/13/2017 1445   IRONPCTSAT 5 (L) 09/13/2017 1445    Exam: Gen alert no distress No rash, cyanosis or gangrene Sclera anicteric, throat clear  No jvd or bruits Chest clear bilat RRR no MRG Abd soft ntnd no mass or ascites +bs GU defer MS no joint effusions or deformity Ext no LE edema, no wounds or ulcers Neuro is alert, Ox 3 , nf    Impression: 1  HTN urgency - better, cont meds 2  CKD V - due to HTN most likely. Doppler of renal arteries in May didn't show any stenosis.  No uremic symptoms.  For permanent access on Friday.   3  Drug abuse    Plan - will sign off.  Our office will contact patient to reschedule f/u office visit for CKD.    Kelly Splinter MD [facility] pager [phone]   12/21/2017, 1:29 PM   Recent Labs  Lab 12/17/17 1133  12/18/17 0124 12/19/17 1613  NA 137   < > 139 142  K 3.7   < > 5.8* 3.7  CL 107   < > 108 110  CO2 18*  --  21* 21*  GLUCOSE 92   < > 123* 110*   BUN 50*   < > 50* 52*  CREATININE 6.01*   < > 6.07* 6.22*  CALCIUM 8.3*  --  7.9* 7.8*  PHOS  --   --  4.8*  --   ALBUMIN 3.7  --  3.2*  --    < > = values in this interval not displayed.   Recent Labs  Lab 12/17/17 1133 12/18/17 0124  AST 18 34  ALT 16 19  ALKPHOS 166* 147*  BILITOT 0.9 1.3*  PROT 6.5 5.6*   Recent Labs  Lab 12/17/17 1133 12/17/17 1139 12/18/17 0124  WBC 4.1  --  5.3  NEUTROABS 2.4  --  2.8  HGB 11.0* 11.6* 10.6*  HCT 33.5* 34.0* 31.5*  MCV 90.1  --  90.0  PLT 192  --  266

## 2017-12-21 NOTE — Care Management Note (Signed)
Case Management Note  Patient Details  Name: Valerie Mathis MRN: [id number] Date of Birth: [birth year]  Subjective/Objective:                    Action/Plan:  [place] letter given and explained. Patient voiced understanding. Expected Discharge Date:  12/20/17               Expected Discharge Plan:  Home/Self Care  In-House Referral:  Financial Counselor  Discharge planning Services  CM Consult, Follow-up appt scheduled, Medication Assistance, [place] [facility]  Post Acute Care Choice:  NA Choice offered to:  Patient  DME Arranged:  N/A DME Agency:  NA  HH Arranged:  NA HH Agency:  NA  Status of Service:  Completed, signed off  If discussed at [place] of Stay Meetings, dates discussed:    Additional Comments:  Marilu Favre, RN 12/21/2017, 4:26 PM

## 2017-12-21 NOTE — Progress Notes (Signed)
[facility] called to set up transportation for patient to go to [facility], report called to [place] on [address].

## 2017-12-21 NOTE — H&P (View-Only) (Signed)
   Plan is for left arm AV fistula versus graft on Friday with my partner Dr. Donnetta Hutching.  She will need to be n.p.o. past midnight on Thursday.  Brandon C. Donzetta Matters, MD [facility] [place] Office: [phone] Pager: [phone]

## 2017-12-21 NOTE — Progress Notes (Signed)
PROGRESS NOTE  Jhanvi Drakeford  GEX:[id number] DOB: Nov 16, 1985 DOA: 12/17/2017 PCP: Patient, No Pcp Per   Brief Narrative: Roland Prine is a 32 y.o. 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 She was admitted and blood pressure improved with medications. Nephrology was consulted. While they did not believe she required urgent hemodialysis, it is likely that she will, and due to her poor follow up, vascular surgery was consulted and will place permanent access prior to discharge on 8/9.  Assessment & Plan: Active Problems:   Hypertensive emergency   Chronic kidney disease Stage IV-V   Aortic dissection [place] type B s/p repair 2018 [facility])   Cocaine use   Uncontrolled hypertension   Hypertensive cardiomyopathy ([place])  Hypertensive emergency: Resolved, initially required cardene gtt.  - Continue hydralazine 100mg  TID, norvasc 10mg  daily, will increase isosorbide 60mg  > 120mg  daily, add clonidine in preparation for stopping beta blocker. CM consulted to assist with medications after discharge. - Avoid beta blockers with cocaine use, currently on labetalol 200mg  TID while inpatient.  AKI on stage IV CKD vs. stage V CKD: Not yet on HD, but requires permanent access in planning for this.  - Nephrology has signed off 8/7, recommended BP control and HD access. Will need [facility]A follow up.  - Monitor BMP, avoid hypotension, nephrotoxins - On calcitriol, bicarb - Needs to have access placed PRIOR to discharge because she is unlikely to follow up. Per previous MD: "Patient is notoriously noncompliant, patient previously missed outpatient appointments with [facility] at least twice in the last few weeks, also patient's phone numbers were not working so nephrology service were unable to  reach patient post discharge from hospital last time."  Chronic HFpEF: Last EF 55-60%. Volume improved with lasix IV BID.  - Continue lasix 120mg  IV BID - Monitor daily weight, I/O  Polysubstance abuse: UDS+ cocaine.  - Precontemplative regarding cessation - SW consulted  History of aortic dissection s/p graft repair: Stable on most recent imaging.   DVT prophylaxis: Heparin Code Status: Full Family Communication: None at bedside Disposition Plan: Home after AVF vs. AVG placed and stable.  Consultants:   Vascular surgery  Nephrology  Procedures:   Placement of left AVF vs. AVG 12/23/2017  Antimicrobials:  None   Subjective: Swelling in legs gone, no HA, chest pain, dyspnea. Ambulatory, eating well.   Objective: Vitals:   12/20/17 2229 12/21/17 0153 12/21/17 0514 12/21/17 0952  BP:  (!) 171/92 (!) 159/95 129/79  Pulse: 73 83 70 81  Resp:  16 20 16   Temp:  97.6 F (36.4 C) 98.4 F (36.9 C) 97.7 F (36.5 C)  TempSrc:  Axillary Oral Oral  SpO2: 100% 100% 98% 98%  Weight:  60 kg (132 lb 4.4 oz)    Height:        Intake/Output Summary (Last 24 hours) at 12/21/2017 1510 Last data filed at 12/21/2017 0600 Gross per 24 hour  Intake 379.71 ml  Output 200 ml  Net 179.71 ml   Filed Weights   12/17/17 1829 12/18/17 0500 12/21/17 0153  Weight: 62.6 kg (138 lb 0.1 oz) 66.3 kg (146 lb 2.6 oz) 60 kg (132 lb 4.4 oz)    Gen: 32 y.o. female in no distress  Pulm: Non-labored breathing. Clear to auscultation bilaterally.  CV: Regular rate and  rhythm. No murmur, rub, or gallop. No JVD, no pedal edema. GI: Abdomen soft, non-tender, non-distended, with normoactive bowel sounds. No organomegaly or masses felt. Ext: Warm, no deformities Skin: No rashes, lesions ulcers Neuro: Alert and oriented. No focal neurological deficits. Psych: Judgement and insight appear normal. Mood & affect appropriate.   Data Reviewed: I have personally reviewed following labs and imaging  studies  CBC: Recent Labs  Lab 12/17/17 1133 12/17/17 1139 12/18/17 0124  WBC 4.1  --  5.3  NEUTROABS 2.4  --  2.8  HGB 11.0* 11.6* 10.6*  HCT 33.5* 34.0* 31.5*  MCV 90.1  --  90.0  PLT 192  --  161   Basic Metabolic Panel: Recent Labs  Lab 12/17/17 1133 12/17/17 1139 12/18/17 0124 12/19/17 1613  NA 137 139 139 142  K 3.7 4.3 5.8* 3.7  CL 107 110 108 110  CO2 18*  --  21* 21*  GLUCOSE 92 90 123* 110*  BUN 50* 54* 50* 52*  CREATININE 6.01* 6.30* 6.07* 6.22*  CALCIUM 8.3*  --  7.9* 7.8*  MG  --   --  2.4  --   PHOS  --   --  4.8*  --    GFR: Estimated Creatinine Clearance: 11 mL/min (A) (by C-G formula based on SCr of 6.22 mg/dL (H)). Liver Function Tests: Recent Labs  Lab 12/17/17 1133 12/18/17 0124  AST 18 34  ALT 16 19  ALKPHOS 166* 147*  BILITOT 0.9 1.3*  PROT 6.5 5.6*  ALBUMIN 3.7 3.2*   No results for input(s): LIPASE, AMYLASE in the last 168 hours. No results for input(s): AMMONIA in the last 168 hours. Coagulation Profile: No results for input(s): INR, PROTIME in the last 168 hours. Cardiac Enzymes: Recent Labs  Lab 12/17/17 1849 12/18/17 0124 12/18/17 0639  TROPONINI 0.04* 0.04* 0.05*   BNP (last 3 results) No results for input(s): PROBNP in the last 8760 hours. HbA1C: No results for input(s): HGBA1C in the last 72 hours. CBG: Recent Labs  Lab 12/18/17 0741 12/19/17 0838 12/20/17 0753 12/21/17 0806 12/21/17 1138  GLUCAP 131* 90 93 88 94   Lipid Profile: No results for input(s): CHOL, HDL, LDLCALC, TRIG, CHOLHDL, LDLDIRECT in the last 72 hours. Thyroid Function Tests: No results for input(s): TSH, T4TOTAL, FREET4, T3FREE, THYROIDAB in the last 72 hours. Anemia Panel: No results for input(s): VITAMINB12, FOLATE, FERRITIN, TIBC, IRON, RETICCTPCT in the last 72 hours. Urine analysis:    Component Value Date/Time   COLORURINE YELLOW 12/17/2017 1834   APPEARANCEUR HAZY (A) 12/17/2017 1834   LABSPEC 1.010 12/17/2017 1834   PHURINE  5.0 12/17/2017 1834   GLUCOSEU NEGATIVE 12/17/2017 1834   HGBUR SMALL (A) 12/17/2017 1834   BILIRUBINUR NEGATIVE 12/17/2017 [place] 12/17/2017 1834   PROTEINUR 100 (A) 12/17/2017 1834   NITRITE NEGATIVE 12/17/2017 1834   LEUKOCYTESUR LARGE (A) 12/17/2017 1834   Recent Results (from the past 240 hour(s))  Urine culture     Status: None   Collection Time: 12/17/17  6:34 PM  Result Value Ref Range Status   Specimen Description   Final    URINE, RANDOM Performed at [facility], [place] [address], [place], [place] [address]    Special Requests   Final    NONE Performed at [facility], [place] [address] [place], [place] [address]    Culture   Final    NO GROWTH Performed at [facility], [place]  [address] [place], [place] [address]    Report Status 12/19/2017 FINAL  Final      Radiology Studies: No results found.  Scheduled Meds: . amLODipine  10 mg Oral Daily  . aspirin  325 mg Oral Daily  . calcitRIOL  0.25 mcg Oral Daily  . heparin  5,000 Units Subcutaneous Q8H  . hydrALAZINE  100 mg Oral TID  . isosorbide mononitrate  60 mg Oral Daily  . labetalol  200 mg Oral TID  . sodium bicarbonate  650 mg Oral BID   Continuous Infusions: . sodium chloride Stopped (12/20/17 1932)  . furosemide 120 mg (12/21/17 1022)     LOS: 4 days   Time spent: 25 minutes.  Patrecia Pour, MD [facility] [email or web address] Password [email or web address] 12/21/2017, 3:10 PM

## 2017-12-22 LAB — BASIC METABOLIC PANEL
ANION GAP: 13 (ref 5–15)
BUN: 47 mg/dL — ABNORMAL HIGH (ref 6–20)
CHLORIDE: 105 mmol/L (ref 98–111)
CO2: 22 mmol/L (ref 22–32)
Calcium: 7.7 mg/dL — ABNORMAL LOW (ref 8.9–10.3)
Creatinine, Ser: 6.49 mg/dL — ABNORMAL HIGH (ref 0.44–1.00)
GFR calc Af Amer: 9 mL/min — ABNORMAL LOW (ref >60)
GFR calc non Af Amer: 8 mL/min — ABNORMAL LOW (ref >60)
GLUCOSE: 81 mg/dL (ref 70–99)
POTASSIUM: 3.8 mmol/L (ref 3.5–5.1)
Sodium: 140 mmol/L (ref 135–145)

## 2017-12-22 LAB — CBC
HEMATOCRIT: 28.1 % — AB (ref 36.0–46.0)
HEMOGLOBIN: 9.1 g/dL — AB (ref 12.0–15.0)
MCH: 29.7 pg (ref 26.0–34.0)
MCHC: 32.4 g/dL (ref 30.0–36.0)
MCV: 91.8 fL (ref 78.0–100.0)
Platelets: 158 10*3/uL (ref 150–400)
RBC: 3.06 MIL/uL — AB (ref 3.87–5.11)
RDW: 17.7 % — AB (ref 11.5–15.5)
WBC: 4 10*3/uL (ref 4.0–10.5)

## 2017-12-22 LAB — GLUCOSE, CAPILLARY: Glucose-Capillary: 77 mg/dL (ref 70–99)

## 2017-12-22 MED ORDER — FUROSEMIDE 10 MG/ML IJ SOLN
80.0000 mg | Freq: Two times a day (BID) | INTRAMUSCULAR | Status: DC
  Administered 2017-12-22: 80 mg via INTRAVENOUS
  Filled 2017-12-22: qty 8

## 2017-12-22 NOTE — Progress Notes (Signed)
PROGRESS NOTE  Valerie Mathis  VOZ:[id number] DOB: 1985/06/14 DOA: 12/17/2017 PCP: Patient, No Pcp Per   Brief Narrative: Valerie Mathis is a 32 y.o. 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 She was admitted and blood pressure improved with medications. Nephrology was consulted. While they did not believe she required urgent hemodialysis, it is likely that she will, and due to her poor follow up, vascular surgery was consulted and will place permanent access prior to discharge on 8/9.  Assessment & Plan: Active Problems:   Hypertensive emergency   Chronic kidney disease Stage IV-V   Aortic dissection Stanford type B s/p repair 2018 [facility])   Cocaine use   Uncontrolled hypertension   Hypertensive cardiomyopathy ([place])  Hypertensive emergency: Resolved, initially required cardene gtt. Normotensive today. - Continue hydralazine 100mg  TID, norvasc 10mg  daily, will increase isosorbide 60mg  > 120mg  daily, add clonidine in preparation for stopping beta blocker. CM consulted to assist with medications after discharge. - Avoid beta blockers with cocaine use, currently on labetalol 200mg  TID while inpatient.  AKI on stage IV CKD vs. stage V CKD: Not yet on HD, but requires permanent access in planning for this.  - Nephrology has signed off 8/7, recommended BP control and HD access. Will need CKA follow up.  - Monitor BMP, avoid hypotension, nephrotoxins - On calcitriol, bicarb - Needs to have access placed PRIOR to discharge because she is unlikely to follow up. Per previous MD: "Patient is notoriously noncompliant, patient previously missed outpatient appointments with [facility] at least twice in the last few weeks, also patient's phone numbers were not working so nephrology  service were unable to reach patient post discharge from hospital last time."  Chronic HFpEF: Last EF 55-60%. Volume improved with lasix IV BID.  - Continue lasix, decrease to 80mg  IV BID. - Monitor daily weight, I/O. Weight stable.  Polysubstance abuse: UDS+ cocaine.  - Precontemplative regarding cessation - SW consulted.  History of aortic dissection s/p graft repair: Stable on most recent imaging.   Normocytic anemia of chronic disease: Downward trend, no active bleeding. - Monitor daily and transfuse if needed.   DVT prophylaxis: Heparin Code Status: Full Family Communication: None at bedside Disposition Plan: Home after AVF vs. AVG placed and stable.  Consultants:   Vascular surgery  Nephrology  Procedures:   Placement of left AVF vs. AVG 12/23/2017  Antimicrobials:  None   Subjective: No swelling, chest pain. Walking and eating fine. Denies bleeding.   Objective: Vitals:   12/21/17 2146 12/22/17 0304 12/22/17 0451 12/22/17 1026  BP: (!) 163/98  (!) 155/95 134/60  Pulse: 73  75 71  Resp: 20  20 (!) 22  Temp: 98.2 F (36.8 C)  98.3 F (36.8 C) 97.7 F (36.5 C)  TempSrc: Oral  Oral Oral  SpO2: 100%  98% 99%  Weight: 59.8 kg 59.8 kg    Height:        Intake/Output Summary (Last 24 hours) at 12/22/2017 1502 Last data filed at 12/22/2017 1100 Gross per 24 hour  Intake 687.3 ml  Output 1550 ml  Net -862.7 ml   Filed Weights   12/21/17 0153 12/21/17 2146 12/22/17 0304  Weight: 60 kg 59.8 kg 59.8 kg   Gen: 32 y.o. female in no distress Pulm: Nonlabored breathing room air. Clear.  CV: Regular rate and rhythm. No murmur, rub, or gallop. No JVD, no dependent edema.  Data Reviewed: I have personally reviewed following labs and imaging studies  CBC: Recent Labs  Lab 12/17/17 1133 12/17/17 1139 12/18/17 0124 12/22/17 0545  WBC 4.1  --  5.3 4.0  NEUTROABS 2.4  --  2.8  --   HGB 11.0* 11.6* 10.6* 9.1*  HCT 33.5* 34.0* 31.5* 28.1*  MCV 90.1  --  90.0 91.8   PLT 192  --  266 657   Basic Metabolic Panel: Recent Labs  Lab 12/17/17 1133 12/17/17 1139 12/18/17 0124 12/19/17 1613 12/22/17 0545  NA 137 139 139 142 140  K 3.7 4.3 5.8* 3.7 3.8  CL 107 110 108 110 105  CO2 18*  --  21* 21* 22  GLUCOSE 92 90 123* 110* 81  BUN 50* 54* 50* 52* 47*  CREATININE 6.01* 6.30* 6.07* 6.22* 6.49*  CALCIUM 8.3*  --  7.9* 7.8* 7.7*  MG  --   --  2.4  --   --   PHOS  --   --  4.8*  --   --    GFR: Estimated Creatinine Clearance: 10.6 mL/min (A) (by C-G formula based on SCr of 6.49 mg/dL (H)). Liver Function Tests: Recent Labs  Lab 12/17/17 1133 12/18/17 0124  AST 18 34  ALT 16 19  ALKPHOS 166* 147*  BILITOT 0.9 1.3*  PROT 6.5 5.6*  ALBUMIN 3.7 3.2*   No results for input(s): LIPASE, AMYLASE in the last 168 hours. No results for input(s): AMMONIA in the last 168 hours. Coagulation Profile: No results for input(s): INR, PROTIME in the last 168 hours. Cardiac Enzymes: Recent Labs  Lab 12/17/17 1849 12/18/17 0124 12/18/17 0639  TROPONINI 0.04* 0.04* 0.05*   BNP (last 3 results) No results for input(s): PROBNP in the last 8760 hours. HbA1C: No results for input(s): HGBA1C in the last 72 hours. CBG: Recent Labs  Lab 12/20/17 0753 12/21/17 0806 12/21/17 1138 12/21/17 1659 12/22/17 0746  GLUCAP 93 88 94 92 77   Lipid Profile: No results for input(s): CHOL, HDL, LDLCALC, TRIG, CHOLHDL, LDLDIRECT in the last 72 hours. Thyroid Function Tests: No results for input(s): TSH, T4TOTAL, FREET4, T3FREE, THYROIDAB in the last 72 hours. Anemia Panel: No results for input(s): VITAMINB12, FOLATE, FERRITIN, TIBC, IRON, RETICCTPCT in the last 72 hours. Urine analysis:    Component Value Date/Time   COLORURINE YELLOW 12/17/2017 1834   APPEARANCEUR HAZY (A) 12/17/2017 1834   LABSPEC 1.010 12/17/2017 1834   PHURINE 5.0 12/17/2017 1834   GLUCOSEU NEGATIVE 12/17/2017 1834   HGBUR SMALL (A) 12/17/2017 1834   BILIRUBINUR NEGATIVE 12/17/2017 [place] 12/17/2017 1834   PROTEINUR 100 (A) 12/17/2017 1834   NITRITE NEGATIVE 12/17/2017 1834   LEUKOCYTESUR LARGE (A) 12/17/2017 1834   Recent Results (from the past 240 hour(s))  Urine culture     Status: None   Collection Time: 12/17/17  6:34 PM  Result Value Ref Range Status   Specimen Description   Final    URINE, RANDOM Performed at [facility], [place] [address], [place], [place] [address]    Special Requests   Final    NONE Performed at [facility], [place] [address], [place], [place] [address]    Culture   Final    NO GROWTH Performed at [facility], [place] [address], [place],  [address]    Report Status 12/19/2017 FINAL  Final      Radiology Studies: No results found.  Scheduled Meds: . amLODipine  10 mg Oral Daily  . aspirin  325 mg Oral Daily  . calcitRIOL  0.25 mcg Oral Daily  . cloNIDine  0.1 mg Oral TID  . heparin  5,000 Units Subcutaneous Q8H  . hydrALAZINE  100 mg Oral TID  . isosorbide mononitrate  120 mg Oral Daily  . labetalol  200 mg Oral TID  . sodium bicarbonate  650 mg Oral BID   Continuous Infusions: . sodium chloride Stopped (12/20/17 1932)  . furosemide 120 mg (12/22/17 0935)     LOS: 5 days   Time spent: 15 minutes.  Patrecia Pour, MD [facility] [email or web address] Password [email or web address] 12/22/2017, 3:02 PM

## 2017-12-22 NOTE — Progress Notes (Signed)
CSW acknowledging consult for patient current substance abuse. Per chart review, patient is a recent readmit, last full assessment completed on 12/02/17 (see below). Patient was already seen by CSW and provided counseling and resources for seeking help with substance abuse.    Valerie Mathis, [place] Clinical Social Worker [phone]        Clinical Social Work Assessment  Patient Details  Name: Valerie Mathis MRN: [id number] Date of Birth: February 04, 1986  Date of referral:  12/02/17               Reason for consult:  Substance Use/ETOH Abuse                        Permission sought to share information with:    Permission granted to share information::  No             Name::                   Agency::                Relationship::                Contact Information:     Housing/Transportation Living arrangements for the past 2 months:  Single Family Home Source of Information:  Patient Patient Interpreter Needed:  None Criminal Activity/Legal Involvement Pertinent to Current Situation/Hospitalization:  No - Comment as needed Significant Relationships:  Siblings, Friend Lives with:  Siblings Do you feel safe going back to the place where you live?  Yes Need for family participation in patient care:  No (Coment)  Care giving concerns:  No care giving concerns at the time of assessment.   Social Worker assessment / plan:  LCSW consulted for substance abuse.  Patient admitted for acute renal failure. Patient positive for cocaine and opiates on admission.   LCSW met at bedside with patient. No supports present. LCSW explained role and reason for visit.   Patient reports that she lives with her sister and is currently unemployed. Patient does not have insurance, but plans to speak with financial counseling before she is dc'd. Patient reports that she has two minor children ages 31 and 7 that live outside the home.   Patient reports that cocaine is her drug of choice  and that she uses every day. Patient reports that she snorts cocaine. She states that she has been using for 7 years.  LCSW provided resources and explained resources available to patients without insurance.    PLAN: Patient agreeable to resources and will follow up on resources at dc.   Employment status:  Unemployed Forensic scientist:  Self Pay (Medicaid Pending) PT Recommendations:  Not assessed at this time Information / Referral to community resources:     Patient/Family's Response to care: Patient is thankful for LCSW visit and SA resources.   Patient/Family's Understanding of and Emotional Response to Diagnosis, Current Treatment, and Prognosis:  Patient is realistic about her current SA. Patient is open and honest about her use. Patient contributes her use to " hanging around the wrong crowd and wanting to fit in. " Patient states " I'm going to get help. I can't keep doing this. I'm tired of coming in and out the hospital."   Emotional Assessment Appearance:  Appears stated age Attitude/Demeanor/Rapport:    Affect (typically observed):  Accepting, Calm, Pleasant Orientation:  Oriented to Self, Oriented to  Time, Oriented to Situation, Oriented to Place Alcohol / Substance use:  Illicit Drugs Psych involvement (Current and /or in the community):  No (Comment)  Discharge Needs  Concerns to be addressed:  Financial / Insurance Concerns Readmission within the last 30 days:  No Current discharge risk:  Substance Abuse Barriers to Discharge:  Continued Medical Work up   [employer], LCSW 12/02/2017, 10:49 AM

## 2017-12-23 ENCOUNTER — Encounter (HOSPITAL_COMMUNITY): Payer: Self-pay

## 2017-12-23 ENCOUNTER — Telehealth: Payer: Self-pay | Admitting: *Deleted

## 2017-12-23 ENCOUNTER — Inpatient Hospital Stay (HOSPITAL_COMMUNITY): Payer: Medicaid Other | Admitting: Anesthesiology

## 2017-12-23 ENCOUNTER — Encounter (HOSPITAL_COMMUNITY)
Admission: EM | Disposition: A | Discharge status: Home / Self Care | Payer: Self-pay | Source: Home / Self Care | Attending: Family Medicine

## 2017-12-23 HISTORY — PX: AV FISTULA PLACEMENT: SHX1204

## 2017-12-23 LAB — BASIC METABOLIC PANEL
ANION GAP: 17 — AB (ref 5–15)
BUN: 51 mg/dL — AB (ref 6–20)
CO2: 21 mmol/L — ABNORMAL LOW (ref 22–32)
Calcium: 7.9 mg/dL — ABNORMAL LOW (ref 8.9–10.3)
Chloride: 101 mmol/L (ref 98–111)
Creatinine, Ser: 6.95 mg/dL — ABNORMAL HIGH (ref 0.44–1.00)
GFR calc Af Amer: 8 mL/min — ABNORMAL LOW (ref >60)
GFR, EST NON AFRICAN AMERICAN: 7 mL/min — AB (ref >60)
Glucose, Bld: 81 mg/dL (ref 70–99)
POTASSIUM: 4 mmol/L (ref 3.5–5.1)
SODIUM: 139 mmol/L (ref 135–145)

## 2017-12-23 LAB — CBC
HCT: 30.1 % — ABNORMAL LOW (ref 36.0–46.0)
Hemoglobin: 9.6 g/dL — ABNORMAL LOW (ref 12.0–15.0)
MCH: 29.4 pg (ref 26.0–34.0)
MCHC: 31.9 g/dL (ref 30.0–36.0)
MCV: 92.3 fL (ref 78.0–100.0)
PLATELETS: 184 10*3/uL (ref 150–400)
RBC: 3.26 MIL/uL — AB (ref 3.87–5.11)
RDW: 17.6 % — ABNORMAL HIGH (ref 11.5–15.5)
WBC: 4.1 10*3/uL (ref 4.0–10.5)

## 2017-12-23 LAB — GLUCOSE, CAPILLARY: GLUCOSE-CAPILLARY: 85 mg/dL (ref 70–99)

## 2017-12-23 LAB — PREGNANCY, URINE: Preg Test, Ur: NEGATIVE

## 2017-12-23 SURGERY — ARTERIOVENOUS (AV) FISTULA CREATION
Anesthesia: Monitor Anesthesia Care | Laterality: Left

## 2017-12-23 MED ORDER — FENTANYL CITRATE (PF) 250 MCG/5ML IJ SOLN
INTRAMUSCULAR | Status: AC
  Filled 2017-12-23: qty 5

## 2017-12-23 MED ORDER — SODIUM CHLORIDE 0.9 % IV SOLN
INTRAVENOUS | Status: DC | PRN
  Administered 2017-12-23: 13:00:00

## 2017-12-23 MED ORDER — ONDANSETRON HCL 4 MG/2ML IJ SOLN
INTRAMUSCULAR | Status: AC
  Filled 2017-12-23: qty 2

## 2017-12-23 MED ORDER — LIDOCAINE-EPINEPHRINE 0.5 %-1:200000 IJ SOLN
INTRAMUSCULAR | Status: AC
  Filled 2017-12-23: qty 1

## 2017-12-23 MED ORDER — LIDOCAINE 2% (20 MG/ML) 5 ML SYRINGE
INTRAMUSCULAR | Status: AC
  Filled 2017-12-23: qty 15

## 2017-12-23 MED ORDER — LABETALOL HCL 100 MG PO TABS
100.0000 mg | ORAL_TABLET | Freq: Three times a day (TID) | ORAL | Status: DC
  Administered 2017-12-23 – 2017-12-24 (×2): 100 mg via ORAL
  Filled 2017-12-23 (×2): qty 1

## 2017-12-23 MED ORDER — FENTANYL CITRATE (PF) 250 MCG/5ML IJ SOLN
INTRAMUSCULAR | Status: DC | PRN
  Administered 2017-12-23 (×5): 50 ug via INTRAVENOUS

## 2017-12-23 MED ORDER — SODIUM CHLORIDE 0.9 % IV SOLN: INTRAVENOUS | Status: DC

## 2017-12-23 MED ORDER — MIDAZOLAM HCL 5 MG/5ML IJ SOLN
INTRAMUSCULAR | Status: DC | PRN
  Administered 2017-12-23: 2 mg via INTRAVENOUS

## 2017-12-23 MED ORDER — CEFAZOLIN SODIUM-DEXTROSE 2-3 GM-%(50ML) IV SOLR
INTRAVENOUS | Status: DC | PRN
  Administered 2017-12-23: 2 g via INTRAVENOUS

## 2017-12-23 MED ORDER — PROPOFOL 10 MG/ML IV BOLUS
INTRAVENOUS | Status: DC | PRN
  Administered 2017-12-23: 200 mg via INTRAVENOUS

## 2017-12-23 MED ORDER — DIPHENHYDRAMINE HCL 50 MG/ML IJ SOLN
INTRAMUSCULAR | Status: AC
  Filled 2017-12-23: qty 1

## 2017-12-23 MED ORDER — HYDROMORPHONE HCL 1 MG/ML IJ SOLN: 0.2500 mg | INTRAMUSCULAR | Status: DC | PRN

## 2017-12-23 MED ORDER — DEXAMETHASONE SODIUM PHOSPHATE 10 MG/ML IJ SOLN
INTRAMUSCULAR | Status: DC | PRN
  Administered 2017-12-23: 5 mg via INTRAVENOUS

## 2017-12-23 MED ORDER — MIDAZOLAM HCL 2 MG/2ML IJ SOLN
INTRAMUSCULAR | Status: AC
  Filled 2017-12-23: qty 2

## 2017-12-23 MED ORDER — 0.9 % SODIUM CHLORIDE (POUR BTL) OPTIME
TOPICAL | Status: DC | PRN
  Administered 2017-12-23: 1000 mL

## 2017-12-23 MED ORDER — MORPHINE SULFATE (PF) 4 MG/ML IV SOLN
4.0000 mg | INTRAVENOUS | Status: DC | PRN
  Administered 2017-12-23 – 2017-12-24 (×2): 4 mg via INTRAVENOUS
  Filled 2017-12-23 (×2): qty 1

## 2017-12-23 MED ORDER — DEXAMETHASONE SODIUM PHOSPHATE 10 MG/ML IJ SOLN
INTRAMUSCULAR | Status: AC
  Filled 2017-12-23: qty 1

## 2017-12-23 MED ORDER — FUROSEMIDE 80 MG PO TABS
80.0000 mg | ORAL_TABLET | Freq: Two times a day (BID) | ORAL | Status: DC
  Administered 2017-12-23 – 2017-12-24 (×2): 80 mg via ORAL
  Filled 2017-12-23 (×2): qty 1

## 2017-12-23 MED ORDER — HYDRALAZINE HCL 20 MG/ML IJ SOLN
10.0000 mg | Freq: Once | INTRAMUSCULAR | Status: AC
  Administered 2017-12-23: 10 mg via INTRAVENOUS
  Filled 2017-12-23: qty 1

## 2017-12-23 MED ORDER — ONDANSETRON HCL 4 MG/2ML IJ SOLN
INTRAMUSCULAR | Status: DC | PRN
  Administered 2017-12-23: 4 mg via INTRAVENOUS

## 2017-12-23 MED ORDER — CLONIDINE HCL 0.2 MG PO TABS
0.2000 mg | ORAL_TABLET | Freq: Three times a day (TID) | ORAL | Status: DC
  Administered 2017-12-23 – 2017-12-24 (×2): 0.2 mg via ORAL
  Filled 2017-12-23 (×2): qty 1

## 2017-12-23 MED ORDER — CEFAZOLIN SODIUM-DEXTROSE 2-4 GM/100ML-% IV SOLN
INTRAVENOUS | Status: AC
  Filled 2017-12-23: qty 100

## 2017-12-23 MED ORDER — LIDOCAINE HCL (CARDIAC) PF 100 MG/5ML IV SOSY
PREFILLED_SYRINGE | INTRAVENOUS | Status: DC | PRN
  Administered 2017-12-23: 60 mg via INTRAVENOUS

## 2017-12-23 SURGICAL SUPPLY — 26 items
ARMBAND PINK RESTRICT EXTREMIT (MISCELLANEOUS) ×6 IMPLANT
CANISTER SUCT 3000ML PPV (MISCELLANEOUS) ×3 IMPLANT
CANNULA VESSEL 3MM 2 BLNT TIP (CANNULA) ×3 IMPLANT
CLIP LIGATING EXTRA MED SLVR (CLIP) ×3 IMPLANT
CLIP LIGATING EXTRA SM BLUE (MISCELLANEOUS) ×3 IMPLANT
COVER PROBE W GEL 5X96 (DRAPES) ×3 IMPLANT
DECANTER SPIKE VIAL GLASS SM (MISCELLANEOUS) ×3 IMPLANT
DERMABOND ADVANCED (GAUZE/BANDAGES/DRESSINGS) ×2
DERMABOND ADVANCED .7 DNX12 (GAUZE/BANDAGES/DRESSINGS) ×1 IMPLANT
ELECT REM PT RETURN 9FT ADLT (ELECTROSURGICAL) ×3
ELECTRODE REM PT RTRN 9FT ADLT (ELECTROSURGICAL) ×1 IMPLANT
GLOVE SS BIOGEL STRL SZ 7.5 (GLOVE) ×1 IMPLANT
GLOVE SUPERSENSE BIOGEL SZ 7.5 (GLOVE) ×2
GOWN STRL REUS W/ TWL LRG LVL3 (GOWN DISPOSABLE) ×3 IMPLANT
GOWN STRL REUS W/TWL LRG LVL3 (GOWN DISPOSABLE) ×6
KIT BASIN OR (CUSTOM PROCEDURE TRAY) ×3 IMPLANT
KIT TURNOVER KIT B (KITS) ×3 IMPLANT
NS IRRIG 1000ML POUR BTL (IV SOLUTION) ×3 IMPLANT
PACK CV ACCESS (CUSTOM PROCEDURE TRAY) ×3 IMPLANT
PAD ARMBOARD 7.5X6 YLW CONV (MISCELLANEOUS) ×6 IMPLANT
SUT PROLENE 6 0 CC (SUTURE) ×3 IMPLANT
SUT VIC AB 3-0 SH 27 (SUTURE) ×2
SUT VIC AB 3-0 SH 27X BRD (SUTURE) ×1 IMPLANT
TOWEL GREEN STERILE (TOWEL DISPOSABLE) ×3 IMPLANT
UNDERPAD 30X30 (UNDERPADS AND DIAPERS) ×3 IMPLANT
WATER STERILE IRR 1000ML POUR (IV SOLUTION) ×3 IMPLANT

## 2017-12-23 NOTE — Anesthesia Preprocedure Evaluation (Addendum)
Anesthesia Evaluation  Patient identified by MRN, date of birth, ID band Patient awake    Reviewed: Allergy & Precautions, NPO status , Patient's Chart, lab work & pertinent test results  Airway Mallampati: II  TM Distance: >3 FB     Dental   Pulmonary Current Smoker,    breath sounds clear to auscultation       Cardiovascular hypertension, + Peripheral Vascular Disease and +CHF   Rhythm:Regular Rate:Normal     Neuro/Psych    GI/Hepatic Neg liver ROS,   Endo/Other    Renal/GU Renal disease     Musculoskeletal   Abdominal   Peds  Hematology  (+) anemia ,   Anesthesia Other Findings   Reproductive/Obstetrics                             Anesthesia Physical Anesthesia Plan  ASA: III  Anesthesia Plan: General   Post-op Pain Management:    Induction: Intravenous  PONV Risk Score and Plan: Ondansetron, Dexamethasone and Midazolam  Airway Management Planned: LMA  Additional Equipment:   Intra-op Plan:   Post-operative Plan: Extubation in OR  Informed Consent: I have reviewed the patients History and Physical, chart, labs and discussed the procedure including the risks, benefits and alternatives for the proposed anesthesia with the patient or authorized representative who has indicated his/her understanding and acceptance.   Dental advisory given  Plan Discussed with: CRNA and Anesthesiologist  Anesthesia Plan Comments:        Anesthesia Quick Evaluation

## 2017-12-23 NOTE — Anesthesia Procedure Notes (Signed)
Procedure Name: LMA Insertion Date/Time: 12/23/2017 11:30 AM Performed by: Mariea Clonts, CRNA Pre-anesthesia Checklist: Patient identified, Emergency Drugs available, Suction available and Patient being monitored Patient Re-evaluated:Patient Re-evaluated prior to induction Oxygen Delivery Method: Circle System Utilized Preoxygenation: Pre-oxygenation with 100% oxygen Induction Type: IV induction Ventilation: Mask ventilation without difficulty LMA: LMA inserted LMA Size: 3.0 Number of attempts: 1 Airway Equipment and Method: Bite block Placement Confirmation: positive ETCO2 Tube secured with: Tape Dental Injury: Teeth and Oropharynx as per pre-operative assessment

## 2017-12-23 NOTE — Progress Notes (Signed)
Pt back from OR.   Pt alert and oriented.  VSS.  Denies pain at this time.

## 2017-12-23 NOTE — Progress Notes (Signed)
  PROGRESS NOTE  Valerie Mathis  FHL:[id number] DOB: 1985-09-24 DOA: 12/17/2017 PCP: Patient, No Pcp Per   Brief Narrative: Valerie Mathis is a 32 y.o. 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 She was admitted and blood pressure improved with medications. Nephrology was consulted. While they did not believe she required urgent hemodialysis, it is likely that she will, and due to her poor follow up, vascular surgery was consulted and will place permanent access prior to discharge on 8/9.  Assessment & Plan: Active Problems:   Hypertensive emergency   Chronic kidney disease Stage IV-V   Aortic dissection Stanford type B s/p repair 2018 [facility])   Cocaine use   Uncontrolled hypertension   Hypertensive cardiomyopathy ([place])  Hypertensive emergency: Resolved, initially required cardene gtt. Normotensive today. - Continue hydralazine 100mg  TID, norvasc 10mg  daily, will isosorbide 120mg  daily, will increase clonidine in preparation for stopping beta blocker which I will decrease the dose of. CM consulted to assist with medications after discharge. - Avoid beta blockers with cocaine use, currently on labetalol 100mg  TID while inpatient.  AKI on stage IV CKD vs. stage V CKD: Not yet on HD, but requires permanent access in planning for this.  - Nephrology has signed off 8/7, recommended BP control and HD access. Will need CKA follow up.  - Monitor BMP, avoid hypotension, nephrotoxins - On calcitriol, bicarb  Chronic HFpEF: Last EF 55-60%. Volume improved with lasix IV BID.  - Continue lasix, will transition back to po and monitor response. If stable, DC tomorrow if ok with vascular surgery.  - Monitor daily weight, I/O. Weight stable.  Polysubstance abuse: UDS+ cocaine.  - Precontemplative  regarding cessation - SW consulted.  History of aortic dissection s/p graft repair: Stable on most recent imaging.   Normocytic anemia of chronic disease: Downward trend, no active bleeding. - Monitor postop  DVT prophylaxis: Heparin Code Status: Full Family Communication: None at bedside Disposition Plan: Home in next 24 hours if hgb stable.  Consultants:   Vascular surgery  Nephrology  Procedures:   Placement of left AVF 12/23/2017 by Dr. Donnetta Hutching  Antimicrobials:  None   Subjective: No issues immediately following procedure. No trouble breathing, chest pain, or bleeding.  Objective: BP 131/69 (BP Location: Right Arm)   Pulse 75   Temp (!) 97.4 F (36.3 C) (Oral)   Resp 16   Ht 5\' 3"  (1.6 m)   Wt 59.8 kg   LMP 11/16/2017 (Approximate)   SpO2 96%   BMI 23.35 kg/m   Gen: 32 y.o. female in no distress Pulm: Nonlabored breathing room air. Clear. CV: Regular rate and rhythm. No JVD or dependent edema. Ext: Left AC fossa with transverse incision with well apposed skin edges in place with dermabond. +thrill.  Time spent: 15 minutes.  Valerie Pour, MD [facility] [email or web address] Password [email or web address] 12/23/2017, 4:06 PM

## 2017-12-23 NOTE — Progress Notes (Signed)
Page MD and made aware of morning b/p-166/102. Awaiting orders, will continue to monitor.

## 2017-12-23 NOTE — Transfer of Care (Signed)
Immediate Anesthesia Transfer of Care Note  Patient: Valerie Mathis  Procedure(s) Performed: ARTERIOVENOUS (AV) FISTULA CREATION  LEFT ARM (Left )  Patient Location: PACU  Anesthesia Type:General  Level of Consciousness: awake, alert  and oriented  Airway & Oxygen Therapy: Patient Spontanous Breathing and Patient connected to nasal cannula oxygen  Post-op Assessment: Report given to RN and Post -op Vital signs reviewed and stable  Post vital signs: Reviewed and stable  Last Vitals:  Vitals Value Taken Time  BP 128/72 12/23/2017 12:55 PM  Temp    Pulse 71 12/23/2017 12:59 PM  Resp 10 12/23/2017 12:59 PM  SpO2 97 % 12/23/2017 12:59 PM  Vitals shown include unvalidated device data.  Last Pain:  Vitals:   12/23/17 1255  TempSrc:   PainSc: (P) Asleep      Patients Stated Pain Goal: 3 (85/92/76 3943)  Complications: No apparent anesthesia complications

## 2017-12-23 NOTE — Telephone Encounter (Signed)
Sched. appt line busy mailed letter  02/02/18 thurs. 2pm dialysis duplex 3pm p/o P/A

## 2017-12-23 NOTE — Op Note (Signed)
    OPERATIVE REPORT  DATE OF SURGERY: 12/23/2017  PATIENT: Valerie Mathis, 32 y.o. female MRN: [id number]  DOB: Sep 29, 1985  PRE-OPERATIVE DIAGNOSIS: Chronic renal insufficiency  POST-OPERATIVE DIAGNOSIS:  Same  PROCEDURE: Left first stage brachial to basilic vein fistula  SURGEON:  Curt Jews, M.D.  PHYSICIAN ASSISTANT: Matt Eveland, PA-C  ANESTHESIA: LMA  EBL: per anesthesia record  Total I/O In: 500 [I.V.:500] Out: 10 [Blood:10]  BLOOD ADMINISTERED: none  DRAINS: none  SPECIMEN: none  COUNTS CORRECT:  YES  PATIENT DISPOSITION:  PACU - hemodynamically stable  PROCEDURE DETAILS: Patient was taken to the heparin placed supine position where the area of the left arm prepped draped you sterile fashion.  SonoSite ultrasound was used to visualize the veins in the left arm.  This revealed a very small cephalic vein throughout its course.  The patient did have a moderate sized basilic vein.  An incision was made transversely at the antecubital space and carried down to isolate the basilic vein which was of good caliber and the brachial artery which was also of good caliber.  Tributary branches of the vein were ligated with 3-0 and 4 silk ties and divided.  The vein was ligated distally and divided.  The vein was brought into approximation of the brachial artery.  The brachial artery was occluded proximally and distally and was opened with 11 blade and sent mostly with Potts scissors.  A small arteriotomy was created.  The vein was cut to the appropriate length and was spatulated and sewn end-to-side to the artery with a running 6-0 Prolene suture.  Clamps removed and excellent thrill was noted.  The patient maintained a 2+ radial pulse.  Irrigated with saline.  Hemostasis talus cautery.  Wounds were closed with 3-0 Vicryl in the subcutaneous and subcuticular tissue.  Sterile dressing was applied and the patient was transferred to the recovery room in stable condition   Rosetta Posner,  M.D., [facility] 12/23/2017 2:02 PM

## 2017-12-23 NOTE — Anesthesia Postprocedure Evaluation (Signed)
Anesthesia Post Note  Patient: Valerie Mathis  Procedure(s) Performed: ARTERIOVENOUS (AV) FISTULA CREATION  LEFT ARM (Left )     Patient location during evaluation: PACU Anesthesia Type: General Level of consciousness: awake Pain management: pain level controlled Respiratory status: spontaneous breathing Cardiovascular status: stable Postop Assessment: no headache Anesthetic complications: no    Last Vitals:  Vitals:   12/23/17 1438 12/23/17 1605  BP: (!) 146/91 131/69  Pulse: 67 75  Resp: 14 16  Temp: 36.5 C (!) 36.3 C  SpO2: 95% 96%    Last Pain:  Vitals:   12/23/17 1605  TempSrc: Oral  PainSc:                  Prashant Glosser

## 2017-12-23 NOTE — Progress Notes (Signed)
Gave labetalol 200 g @ 0945 for pre-procedure.

## 2017-12-23 NOTE — Interval H&P Note (Signed)
History and Physical Interval Note:  12/23/2017 10:27 AM  Valerie Mathis  has presented today for surgery, with the diagnosis of END STAGE RENAL DISEASE FOR HEMODIALYSIS ACCESS  The various methods of treatment have been discussed with the patient and family. After consideration of risks, benefits and other options for treatment, the patient has consented to  Procedure(s): ARTERIOVENOUS (AV) FISTULA CREATION VERSUS INSERTION OF ARTERIOVENOUS GRAFT LEFT ARM (Left) as a surgical intervention .  The patient's history has been reviewed, patient examined, no change in status, stable for surgery.  I have reviewed the patient's chart and labs.  Questions were answered to the patient's satisfaction.     Curt Jews

## 2017-12-24 ENCOUNTER — Encounter (HOSPITAL_COMMUNITY): Payer: Self-pay | Admitting: Vascular Surgery

## 2017-12-24 DIAGNOSIS — I43 Cardiomyopathy in diseases classified elsewhere: Secondary | ICD-10-CM

## 2017-12-24 DIAGNOSIS — I119 Hypertensive heart disease without heart failure: Secondary | ICD-10-CM

## 2017-12-24 DIAGNOSIS — I10 Essential (primary) hypertension: Secondary | ICD-10-CM

## 2017-12-24 DIAGNOSIS — I7100 Dissection of unspecified site of aorta: Secondary | ICD-10-CM

## 2017-12-24 DIAGNOSIS — N184 Chronic kidney disease, stage 4 (severe): Secondary | ICD-10-CM

## 2017-12-24 DIAGNOSIS — I161 Hypertensive emergency: Principal | ICD-10-CM

## 2017-12-24 DIAGNOSIS — F1490 Cocaine use, unspecified, uncomplicated: Secondary | ICD-10-CM

## 2017-12-24 LAB — CBC
HEMATOCRIT: 30.3 % — AB (ref 36.0–46.0)
Hemoglobin: 9.5 g/dL — ABNORMAL LOW (ref 12.0–15.0)
MCH: 29.6 pg (ref 26.0–34.0)
MCHC: 31.4 g/dL (ref 30.0–36.0)
MCV: 94.4 fL (ref 78.0–100.0)
Platelets: 176 10*3/uL (ref 150–400)
RBC: 3.21 MIL/uL — ABNORMAL LOW (ref 3.87–5.11)
RDW: 17.7 % — AB (ref 11.5–15.5)
WBC: 6.1 10*3/uL (ref 4.0–10.5)

## 2017-12-24 LAB — BASIC METABOLIC PANEL
ANION GAP: 17 — AB (ref 5–15)
BUN: 51 mg/dL — AB (ref 6–20)
CHLORIDE: 102 mmol/L (ref 98–111)
CO2: 18 mmol/L — ABNORMAL LOW (ref 22–32)
Calcium: 7.7 mg/dL — ABNORMAL LOW (ref 8.9–10.3)
Creatinine, Ser: 6.86 mg/dL — ABNORMAL HIGH (ref 0.44–1.00)
GFR calc Af Amer: 9 mL/min — ABNORMAL LOW (ref >60)
GFR, EST NON AFRICAN AMERICAN: 7 mL/min — AB (ref >60)
GLUCOSE: 106 mg/dL — AB (ref 70–99)
POTASSIUM: 4.3 mmol/L (ref 3.5–5.1)
Sodium: 137 mmol/L (ref 135–145)

## 2017-12-24 LAB — GLUCOSE, CAPILLARY: Glucose-Capillary: 86 mg/dL (ref 70–99)

## 2017-12-24 MED ORDER — FUROSEMIDE 80 MG PO TABS: 80.0000 mg | ORAL_TABLET | Freq: Two times a day (BID) | ORAL | 0 refills | Status: DC

## 2017-12-24 MED ORDER — ISOSORBIDE MONONITRATE ER 120 MG PO TB24: 120.0000 mg | ORAL_TABLET | Freq: Every day | ORAL | 0 refills | Status: DC

## 2017-12-24 MED ORDER — SODIUM BICARBONATE 650 MG PO TABS: 650.0000 mg | ORAL_TABLET | Freq: Two times a day (BID) | ORAL | 0 refills | Status: DC

## 2017-12-24 MED ORDER — FERROUS SULFATE 325 (65 FE) MG PO TABS: 325.0000 mg | ORAL_TABLET | Freq: Two times a day (BID) | ORAL | 0 refills | Status: DC

## 2017-12-24 MED ORDER — CALCITRIOL 0.25 MCG PO CAPS: 0.2500 ug | ORAL_CAPSULE | Freq: Every day | ORAL | 0 refills | Status: DC

## 2017-12-24 MED ORDER — CLONIDINE HCL 0.2 MG PO TABS: 0.2000 mg | ORAL_TABLET | Freq: Three times a day (TID) | ORAL | 0 refills | Status: DC

## 2017-12-24 MED ORDER — HYDRALAZINE HCL 100 MG PO TABS: 100.0000 mg | ORAL_TABLET | Freq: Three times a day (TID) | ORAL | 0 refills | Status: DC

## 2017-12-24 NOTE — Progress Notes (Signed)
Patient ID: Valerie Mathis, female   DOB: Jun 04, 1985, 32 y.o.   MRN: [id number] Comfortable.  Denies any pain or numbness in her left hand. Left antecubital incision without hematoma.  Excellent thrill noted.  2+ radial pulse. Some fullness in her medial upper arm. We will not follow actively.  Patient has follow-up scheduled in our office in 4 to 6 weeks to determine timing of second stage basilic vein transposition.  Discussed this again with the patient who understands

## 2017-12-24 NOTE — Discharge Instructions (Signed)
° °  [facility]  Discharge Instructions  AV Fistula or Graft Surgery for Dialysis Access  Please refer to the following instructions for your post-procedure care. Your surgeon or physician assistant will discuss any changes with you.  Activity  You may drive the day following your surgery, if you are comfortable and no longer taking prescription pain medication. Resume full activity as the soreness in your incision resolves.  Bathing/Showering  You may shower after you go home. Keep your incision dry for 48 hours. Do not soak in a bathtub, hot tub, or swim until the incision heals completely. You may not shower if you have a hemodialysis catheter.  Incision Care  Clean your incision with mild soap and water after 48 hours. Pat the area dry with a clean towel. You do not need a bandage unless otherwise instructed. Do not apply any ointments or creams to your incision. You may have skin glue on your incision. Do not peel it off. It will come off on its own in about one week. Your arm may swell a bit after surgery. To reduce swelling use pillows to elevate your arm so it is above your heart. Your doctor will tell you if you need to lightly wrap your arm with an ACE bandage.  Diet  Resume your normal diet. There are not special food restrictions following this procedure. In order to heal from your surgery, it is CRITICAL to get adequate nutrition. Your body requires vitamins, minerals, and protein. Vegetables are the best source of vitamins and minerals. Vegetables also provide the perfect balance of protein. Processed food has little nutritional value, so try to avoid this.  Medications  Resume taking all of your medications. If your incision is causing pain, you may take over-the counter pain relievers such as acetaminophen (Tylenol). If you were prescribed a stronger pain medication, please be aware these medications can cause nausea and constipation. Prevent  nausea by taking the medication with a snack or meal. Avoid constipation by drinking plenty of fluids and eating foods with high amount of fiber, such as fruits, vegetables, and grains.  Do not take Tylenol if you are taking prescription pain medications.  Follow up Your surgeon may want to see you in the office following your access surgery. If so, this will be arranged at the time of your surgery.  Please call us immediately for any of the following conditions:  Increased pain, redness, drainage (pus) from your incision site Fever of 101 degrees or higher Severe or worsening pain at your incision site Hand pain or numbness.  Reduce your risk of vascular disease:  Stop smoking. If you would like help, call [facility] at 1-800-QUIT-NOW 361-858-9567) or [place] at [place] your cholesterol Maintain a desired weight Control your diabetes Keep your blood pressure down  Dialysis  It will take several weeks to several months for your new dialysis access to be ready for use. Your surgeon will determine when it is okay to use it. Your nephrologist will continue to direct your dialysis. You can continue to use your Permcath until your new access is ready for use.   12/24/2017 Valerie Mathis [id number] July 15, 1985  Surgeon(s): Early, Arvilla Meres, MD  Procedure(s): Creation left 1st stage basilic vein transposition.    x Do not stick fistula for 12 weeks    If you have any questions, please call the office at [phone].

## 2017-12-24 NOTE — Progress Notes (Signed)
Patient discharged home per MD. Reviewed discharge instructions with patient. Provided discharge instructions along with printed prescriptions for [facility] letter. Patient escorted out via wheelchair by NT. Bartholomew Crews, RN

## 2017-12-24 NOTE — Discharge Summary (Signed)
Physician Discharge Summary  Valerie Mathis XFG:[id number] DOB: 28-Feb-1986 DOA: 12/17/2017  PCP: Patient, No Pcp Per  Admit date: 12/17/2017 Discharge date: 12/24/2017  Admitted From: Home Disposition: Home   Recommendations for Outpatient Follow-up:  1. Establish care with PCP in 1-2 weeks. Monitor BP, HR. 2. Follow up with vascular surgery, Dr. Donnetta Hutching. Per his note 8/10, "Patient has follow-up scheduled in our office in 4 to 6 weeks to determine timing of second stage basilic vein transposition." 3. Follow up with [facility].  Home Health: None Equipment/Devices: None Discharge Condition: Stable CODE STATUS: Full Diet recommendation: Heart healthy  Brief/Interim Summary: Valerie Mathis is a 32 y.o. 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 She was admitted and blood pressure improved with medications. Nephrology was consulted. While they did not believe she required urgent hemodialysis, it is likely that she will, and due to her poor follow up, vascular surgery was consulted and placed permanent access on 8/9.   Discharge Diagnoses:  Active Problems:   Hypertensive emergency   Chronic kidney disease Stage IV-V   Aortic dissection [place] type B s/p repair 2018 [facility])   Cocaine use   Uncontrolled hypertension   Hypertensive cardiomyopathy ([place])  Hypertensive emergency: Resolved, initially required cardene gtt. Normotensive today. - Continue hydralazine 100mg  TID, norvasc 10mg  daily, isosorbide 120mg  daily, clonidine. CM consulted to assist with medications after discharge, [place] letter provided. - Avoid beta blockers with cocaine use  AKI on stage IV CKD vs. stage V CKD: Not yet on HD, but requires permanent access in planning for this.  - Nephrology has signed  off 8/7, recommended BP control and HD access. Will need CKA follow up.  - Monitor BMP, avoid hypotension, nephrotoxins - On calcitriol, bicarb  Chronic HFpEF: Last EF 55-60%. Volume improved with lasix IV BID.  - Continue po lasix. Weight stable.  Polysubstance abuse: UDS+ cocaine.  - Cessation counseling provided by multiple providers throughout hospitalization - SW consulted.  History of aortic dissection s/p graft repair: Stable on most recent imaging.   Normocytic anemia of chronic disease: Downward trend, no active bleeding. EBL minimal in surgery, and hgb stable at 9.5g/dl at discharge.  Discharge Instructions Discharge Instructions    Diet - low sodium heart healthy   Complete by:  As directed    Discharge instructions   Complete by:  As directed    Prescriptions are printed at discharge and you can take them to aqny pharmacy on the list provided to you. Start taking medications as below today to help treat your kidney failure and high blood pressure.  - Call [facility] this next week to schedule a follow up appointment - You will be contacted to schedule an appointment with vascular surgery - Go to the [facility] as below for hospital follow up. An appointment has been scheduled for you. - Do not use any drugs - If your symptoms return, seek medical attention right away.     Allergies as of 12/24/2017   No Known Allergies     Medication List    STOP taking these medications   carvedilol 25 MG tablet Commonly known as:  COREG   polyethylene glycol packet Commonly known as:  MIRALAX / GLYCOLAX     TAKE these medications   amLODipine 10 MG tablet  Commonly known as:  NORVASC Take 1 tablet (10 mg total) by mouth daily. for high blood pressure   calcitRIOL 0.25 MCG capsule Commonly known as:  ROCALTROL Take 1 capsule (0.25 mcg total) by mouth daily.   cloNIDine 0.2 MG tablet Commonly known as:  CATAPRES Take 1 tablet (0.2 mg total) by mouth  3 (three) times daily.   ferrous sulfate 325 (65 FE) MG tablet Take 1 tablet (325 mg total) by mouth 2 (two) times daily with a meal.   furosemide 80 MG tablet Commonly known as:  LASIX Take 1 tablet (80 mg total) by mouth 2 (two) times daily.   hydrALAZINE 100 MG tablet Commonly known as:  APRESOLINE Take 1 tablet (100 mg total) by mouth 3 (three) times daily.   isosorbide mononitrate 120 MG 24 hr tablet Commonly known as:  IMDUR Take 1 tablet (120 mg total) by mouth daily. Start taking on:  12/25/2017   sodium bicarbonate 650 MG tablet Take 1 tablet (650 mg total) by mouth 2 (two) times daily.      Follow-up [facility] Follow up on 12/28/2017.   Specialty:  Internal Medicine Why:  Appointment Aug 14 at 9:30 AM. Please take photo ID, all medications with you. Please keep this appointment, they are hard to have. Contact information: [place] [phone]       Rosetta Posner, MD In 6 weeks.   Specialties:  Vascular Surgery, Cardiology Why:  Office will call you to arrange your appt (sent) Contact information: [place] [address] [phone]        Rexene Agent, MD. Schedule an appointment as soon as possible for a visit in 2 week(s).   Specialty:  Nephrology Contact information: [place] [address] [phone]          No Known Allergies  Consultations:  Nephrology  Vascular surgery  Nephrology  Procedures/Studies: X-ray Chest Pa And Lateral  Result Date: 12/18/2017 CLINICAL DATA:  Increased shortness of breath for 2 days. EXAM: CHEST - 2 VIEW COMPARISON:  A3 2019 FINDINGS: Sequelae of sternotomy are again identified. The cardiac silhouette remains enlarged. Stable aortic prominence corresponds to the known chronic dissection. There is new patchy airspace opacity in the right lung base, and there is a new small right pleural effusion. The  left lung remains clear. No pneumothorax or acute osseous abnormality is identified. IMPRESSION: New small right pleural effusion with right basilar airspace opacity which may reflect pneumonia or atelectasis. Electronically Signed   By: Logan Bores M.D.   On: 12/18/2017 11:16   Dg Chest 2 View  Result Date: 12/17/2017 CLINICAL DATA:  Shortness of breath this morning. EXAM: CHEST - 2 VIEW COMPARISON:  Chest x-ray dated 09/13/2017. FINDINGS: Stable cardiomegaly. Overall cardiomediastinal silhouette is stable, with configuration compatible with patient's known chronic dissection involving the distal aortic arch and descending thoracic aorta (most recently demonstrated on chest MRI dated 11/29/2017). Lungs are clear. No pleural effusion or pneumothorax seen. No acute or suspicious osseous finding. IMPRESSION: No active cardiopulmonary disease. No evidence of pneumonia or pulmonary edema. Stable chronic findings detailed above. Electronically Signed   By: Franki Cabot M.D.   On: 12/17/2017 12:13   Mr Jodene Nam Chest Wo Contrast  Result Date: 11/29/2017 CLINICAL DATA:  History prior thoracic aortic dissection with repair of ascending thoracic aortic dissection and residual chronic type B dissection of the descending thoracic  aorta. Now with chest pain, hypertension and edema involving the left side of the body. Iodinated contrast and gadolinium cannot be administered due to significant renal failure. EXAM: MRA CHEST WITH OR WITHOUT CONTRAST; MRA ABDOMEN WITH OR WITHOUT CONTRAST TECHNIQUE: Angiographic images of the chest were obtained using MRA technique without intravenous contrast. CONTRAST:  None COMPARISON:  10/20/2017 FINDINGS: VASCULAR Aorta: Stable appearance of the thoracic aorta with prior repair of the ascending thoracic aorta. Residual chronic dissection which begins in the distal arch has a stable appearance. Maximum of the distal arch and proximal descending thoracic aorta is 4.3 cm and is stable. Intimal  flap extends again to the level of the mid descending thoracic aorta. No evidence of significant enlargement of the false lumen or acute hemorrhage. Heart: Stable cardiac enlargement and evidence of left ventricular hypertrophy. Trace pericardial fluid. Pulmonary Arteries:  Normal caliber central pulmonary arteries. Other: The abdominal aorta and its branches remain unremarkable without evidence of aneurysm or dissection. NON-VASCULAR There is some interval enlargement in size of a right pleural effusion which remains still of fairly small volume. There is a very small left pleural effusion. Small amount of ascites in the peritoneal cavity shows similar appearance to the prior study. IMPRESSION: VASCULAR Stable appearance of residual chronic dissection involving the distal aortic arch and descending thoracic aorta. No significant increased caliber of the descending aorta which measures 4.3 cm in maximum diameter. No evidence of acute hemorrhage. Dissection again terminates at the level of the mid descending thoracic aorta and does not extend into the abdomen. NON-VASCULAR Some interval enlargement in size of a right pleural effusion. There is a small left pleural effusion. Small amount of ascites in the peritoneal cavity shows similar appearance to the prior study. Electronically Signed   By: Aletta Edouard M.D.   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 Now with chest pain, hypertension and edema involving the left side of the body. Iodinated contrast and gadolinium cannot be administered due to significant renal failure. EXAM: MRA CHEST WITH OR WITHOUT CONTRAST; MRA ABDOMEN WITH OR WITHOUT CONTRAST TECHNIQUE: Angiographic images of the chest were obtained using MRA technique without intravenous  contrast. CONTRAST:  None COMPARISON:  10/20/2017 FINDINGS: VASCULAR Aorta: Stable appearance of the thoracic aorta with prior repair of the ascending thoracic aorta. Residual chronic dissection which begins in the distal arch has a stable appearance. Maximum of the distal arch and proximal descending thoracic aorta is 4.3 cm and is stable. Intimal flap extends again to the level of the mid descending thoracic aorta. No evidence of significant enlargement of the false lumen or acute hemorrhage. Heart: Stable cardiac enlargement and evidence of left ventricular hypertrophy. Trace pericardial fluid. Pulmonary Arteries:  Normal caliber central pulmonary arteries. Other: The abdominal aorta and its branches remain unremarkable without evidence of aneurysm or dissection. NON-VASCULAR There is some interval enlargement in size of a right pleural effusion which remains still of fairly small volume. There is a very small left pleural effusion. Small amount of ascites in the peritoneal cavity shows similar appearance to the prior study. IMPRESSION: VASCULAR Stable appearance of residual chronic dissection involving the distal aortic arch and descending thoracic aorta. No significant increased caliber of the descending aorta which measures 4.3 cm in maximum diameter. No evidence of acute hemorrhage. Dissection again  terminates at the level of the mid descending thoracic aorta and does not extend into the abdomen. NON-VASCULAR Some interval enlargement in size of a right pleural effusion. There is a small left pleural effusion. Small amount of ascites in the peritoneal cavity shows similar appearance to the prior study. Electronically Signed   By: Aletta Edouard M.D.   On: 11/29/2017 17:54     Placement of left AVF 12/23/2017 by Dr. Donnetta Hutching  Subjective: No issues post op, feels well. No HA, vision changes, neck pain, left arm numbness, tingling, pain or bleeding at surgical site.  Discharge Exam: Vitals:   12/24/17  0518 12/24/17 0849  BP: (!) 158/85 (!) 146/90  Pulse: 72 72  Resp: 17 16  Temp: 98.2 F (36.8 C) (!) 97.4 F (36.3 C)  SpO2: 97% 97%   General: Pt is alert, awake, not in acute distress Cardiovascular: RRR, S1/S2 +, no rubs, no gallops Respiratory: CTA bilaterally, no wheezing, no rhonchi Abdominal: Soft, NT, ND, bowel sounds + Extremities: No edema, no cyanosis. Left AC fossa with transverse incision with well apposed skin edges in place with dermabond. +thrill. Distally with intact sensory and motor function, warm, brisk cap refill, 2+ radial pulse.  Labs: BNP (last 3 results) Recent Labs    09/12/17 1531  BNP >4,650.3*   Basic Metabolic Panel: Recent Labs  Lab 12/18/17 0124 12/19/17 1613 12/22/17 0545 12/23/17 0455 12/24/17 0436  NA 139 142 140 139 137  K 5.8* 3.7 3.8 4.0 4.3  CL 108 110 105 101 102  CO2 21* 21* 22 21* 18*  GLUCOSE 123* 110* 81 81 106*  BUN 50* 52* 47* 51* 51*  CREATININE 6.07* 6.22* 6.49* 6.95* 6.86*  CALCIUM 7.9* 7.8* 7.7* 7.9* 7.7*  MG 2.4  --   --   --   --   PHOS 4.8*  --   --   --   --    Liver Function Tests: Recent Labs  Lab 12/17/17 1133 12/18/17 0124  AST 18 34  ALT 16 19  ALKPHOS 166* 147*  BILITOT 0.9 1.3*  PROT 6.5 5.6*  ALBUMIN 3.7 3.2*   No results for input(s): LIPASE, AMYLASE in the last 168 hours. No results for input(s): AMMONIA in the last 168 hours. CBC: Recent Labs  Lab 12/17/17 1133 12/17/17 1139 12/18/17 0124 12/22/17 0545 12/23/17 0455 12/24/17 0436  WBC 4.1  --  5.3 4.0 4.1 6.1  NEUTROABS 2.4  --  2.8  --   --   --   HGB 11.0* 11.6* 10.6* 9.1* 9.6* 9.5*  HCT 33.5* 34.0* 31.5* 28.1* 30.1* 30.3*  MCV 90.1  --  90.0 91.8 92.3 94.4  PLT 192  --  266 158 184 176   Cardiac Enzymes: Recent Labs  Lab 12/17/17 1849 12/18/17 0124 12/18/17 0639  TROPONINI 0.04* 0.04* 0.05*   BNP: Invalid input(s): POCBNP CBG: Recent Labs  Lab 12/21/17 1138 12/21/17 1659 12/22/17 0746 12/23/17 0741 12/24/17 0811   GLUCAP 94 92 77 85 86   D-Dimer No results for input(s): DDIMER in the last 72 hours. Hgb A1c No results for input(s): HGBA1C in the last 72 hours. Lipid Profile No results for input(s): CHOL, HDL, LDLCALC, TRIG, CHOLHDL, LDLDIRECT in the last 72 hours. Thyroid function studies No results for input(s): TSH, T4TOTAL, T3FREE, THYROIDAB in the last 72 hours.  Invalid input(s): FREET3 Anemia work up No results for input(s): VITAMINB12, FOLATE, FERRITIN, TIBC, IRON, RETICCTPCT in the last 72 hours. Urinalysis  Component Value Date/Time   COLORURINE YELLOW 12/17/2017 1834   APPEARANCEUR HAZY (A) 12/17/2017 1834   LABSPEC 1.010 12/17/2017 1834   PHURINE 5.0 12/17/2017 1834   GLUCOSEU NEGATIVE 12/17/2017 1834   HGBUR SMALL (A) 12/17/2017 1834   BILIRUBINUR NEGATIVE 12/17/2017 1834   KETONESUR NEGATIVE 12/17/2017 1834   PROTEINUR 100 (A) 12/17/2017 1834   NITRITE NEGATIVE 12/17/2017 1834   LEUKOCYTESUR LARGE (A) 12/17/2017 1834    Microbiology Recent Results (from the past 240 hour(s))  Urine culture     Status: None   Collection Time: 12/17/17  6:34 PM  Result Value Ref Range Status   Specimen Description   Final    URINE, RANDOM Performed at [facility], [place] [address], [place], [place] [address]    Special Requests   Final    NONE Performed at [facility], [place] [address] [place], [place] [address]    Culture   Final    NO GROWTH Performed at [facility], [place] [address], [place], [place] [address]    Report Status 12/19/2017 FINAL  Final    Time coordinating discharge: Approximately 40 minutes  Patrecia Pour, MD  [facility] 12/24/2017, 10:22 AM Pager [phone]

## 2017-12-26 ENCOUNTER — Other Ambulatory Visit: Payer: Self-pay

## 2017-12-26 DIAGNOSIS — Z992 Dependence on renal dialysis: Principal | ICD-10-CM

## 2017-12-26 DIAGNOSIS — N186 End stage renal disease: Secondary | ICD-10-CM

## 2017-12-28 ENCOUNTER — Ambulatory Visit: Payer: Self-pay | Admitting: Family Medicine

## 2018-01-13 ENCOUNTER — Other Ambulatory Visit: Payer: Self-pay

## 2018-01-13 ENCOUNTER — Encounter (HOSPITAL_COMMUNITY): Payer: Self-pay | Admitting: Emergency Medicine

## 2018-01-13 ENCOUNTER — Inpatient Hospital Stay (HOSPITAL_COMMUNITY)
Admission: EM | Admit: 2018-01-13 | Discharge: 2018-01-23 | DRG: 252 | Disposition: A | Discharge status: Home / Self Care | Payer: Medicaid Other | Attending: Internal Medicine | Admitting: Internal Medicine

## 2018-01-13 DIAGNOSIS — I871 Compression of vein: Secondary | ICD-10-CM | POA: Diagnosis present

## 2018-01-13 DIAGNOSIS — F1410 Cocaine abuse, uncomplicated: Secondary | ICD-10-CM

## 2018-01-13 DIAGNOSIS — M311 Thrombotic microangiopathy: Secondary | ICD-10-CM | POA: Diagnosis present

## 2018-01-13 DIAGNOSIS — I5033 Acute on chronic diastolic (congestive) heart failure: Secondary | ICD-10-CM | POA: Diagnosis not present

## 2018-01-13 DIAGNOSIS — I132 Hypertensive heart and chronic kidney disease with heart failure and with stage 5 chronic kidney disease, or end stage renal disease: Secondary | ICD-10-CM | POA: Diagnosis present

## 2018-01-13 DIAGNOSIS — I7101 Dissection of thoracic aorta: Secondary | ICD-10-CM | POA: Diagnosis present

## 2018-01-13 DIAGNOSIS — I82B12 Acute embolism and thrombosis of left subclavian vein: Secondary | ICD-10-CM | POA: Diagnosis present

## 2018-01-13 DIAGNOSIS — D631 Anemia in chronic kidney disease: Secondary | ICD-10-CM | POA: Diagnosis present

## 2018-01-13 DIAGNOSIS — I169 Hypertensive crisis, unspecified: Secondary | ICD-10-CM

## 2018-01-13 DIAGNOSIS — F17210 Nicotine dependence, cigarettes, uncomplicated: Secondary | ICD-10-CM | POA: Diagnosis present

## 2018-01-13 DIAGNOSIS — Z992 Dependence on renal dialysis: Secondary | ICD-10-CM

## 2018-01-13 DIAGNOSIS — T501X6A Underdosing of loop [high-ceiling] diuretics, initial encounter: Secondary | ICD-10-CM | POA: Diagnosis present

## 2018-01-13 DIAGNOSIS — N186 End stage renal disease: Secondary | ICD-10-CM | POA: Diagnosis present

## 2018-01-13 DIAGNOSIS — I7102 Dissection of abdominal aorta: Secondary | ICD-10-CM

## 2018-01-13 DIAGNOSIS — Z8249 Family history of ischemic heart disease and other diseases of the circulatory system: Secondary | ICD-10-CM

## 2018-01-13 DIAGNOSIS — E872 Acidosis: Secondary | ICD-10-CM | POA: Diagnosis present

## 2018-01-13 DIAGNOSIS — N2581 Secondary hyperparathyroidism of renal origin: Secondary | ICD-10-CM | POA: Diagnosis present

## 2018-01-13 DIAGNOSIS — N179 Acute kidney failure, unspecified: Secondary | ICD-10-CM | POA: Diagnosis present

## 2018-01-13 DIAGNOSIS — I161 Hypertensive emergency: Principal | ICD-10-CM | POA: Diagnosis present

## 2018-01-13 LAB — CBC
HEMATOCRIT: 36 % (ref 36.0–46.0)
Hemoglobin: 11.1 g/dL — ABNORMAL LOW (ref 12.0–15.0)
MCH: 28.8 pg (ref 26.0–34.0)
MCHC: 30.8 g/dL (ref 30.0–36.0)
MCV: 93.3 fL (ref 78.0–100.0)
Platelets: 165 10*3/uL (ref 150–400)
RBC: 3.86 MIL/uL — AB (ref 3.87–5.11)
RDW: 16.2 % — ABNORMAL HIGH (ref 11.5–15.5)
WBC: 4 10*3/uL (ref 4.0–10.5)

## 2018-01-13 LAB — BASIC METABOLIC PANEL
ANION GAP: 14 (ref 5–15)
BUN: 54 mg/dL — ABNORMAL HIGH (ref 6–20)
CHLORIDE: 109 mmol/L (ref 98–111)
CO2: 14 mmol/L — AB (ref 22–32)
Calcium: 8.8 mg/dL — ABNORMAL LOW (ref 8.9–10.3)
Creatinine, Ser: 7.16 mg/dL — ABNORMAL HIGH (ref 0.44–1.00)
GFR calc Af Amer: 8 mL/min — ABNORMAL LOW (ref >60)
GFR calc non Af Amer: 7 mL/min — ABNORMAL LOW (ref >60)
GLUCOSE: 119 mg/dL — AB (ref 70–99)
POTASSIUM: 4.4 mmol/L (ref 3.5–5.1)
Sodium: 137 mmol/L (ref 135–145)

## 2018-01-13 MED ORDER — OXYCODONE-ACETAMINOPHEN 5-325 MG PO TABS
1.0000 | ORAL_TABLET | Freq: Once | ORAL | Status: AC
  Administered 2018-01-13: 1 via ORAL
  Filled 2018-01-13: qty 1

## 2018-01-13 NOTE — ED Triage Notes (Signed)
Pt presents to ED for left sided flank pain, sudden onset at 1600, strong onset, eased off, and now throbbing again.  Patient hx of kidney failure, aortic dissection.  States her left side feels swollen.  Pt denies changes urination or bowel

## 2018-01-14 ENCOUNTER — Other Ambulatory Visit: Payer: Self-pay

## 2018-01-14 ENCOUNTER — Emergency Department (HOSPITAL_COMMUNITY): Payer: Medicaid Other

## 2018-01-14 ENCOUNTER — Inpatient Hospital Stay (HOSPITAL_COMMUNITY): Payer: Medicaid Other

## 2018-01-14 DIAGNOSIS — M311 Thrombotic microangiopathy: Secondary | ICD-10-CM | POA: Diagnosis present

## 2018-01-14 DIAGNOSIS — I5033 Acute on chronic diastolic (congestive) heart failure: Secondary | ICD-10-CM | POA: Diagnosis not present

## 2018-01-14 DIAGNOSIS — I169 Hypertensive crisis, unspecified: Secondary | ICD-10-CM | POA: Diagnosis present

## 2018-01-14 DIAGNOSIS — Z992 Dependence on renal dialysis: Secondary | ICD-10-CM | POA: Diagnosis not present

## 2018-01-14 DIAGNOSIS — N179 Acute kidney failure, unspecified: Secondary | ICD-10-CM | POA: Diagnosis present

## 2018-01-14 DIAGNOSIS — I161 Hypertensive emergency: Secondary | ICD-10-CM | POA: Diagnosis present

## 2018-01-14 DIAGNOSIS — F1410 Cocaine abuse, uncomplicated: Secondary | ICD-10-CM

## 2018-01-14 DIAGNOSIS — N186 End stage renal disease: Secondary | ICD-10-CM | POA: Diagnosis present

## 2018-01-14 DIAGNOSIS — I132 Hypertensive heart and chronic kidney disease with heart failure and with stage 5 chronic kidney disease, or end stage renal disease: Secondary | ICD-10-CM | POA: Diagnosis present

## 2018-01-14 DIAGNOSIS — F17210 Nicotine dependence, cigarettes, uncomplicated: Secondary | ICD-10-CM | POA: Diagnosis present

## 2018-01-14 DIAGNOSIS — I871 Compression of vein: Secondary | ICD-10-CM | POA: Diagnosis present

## 2018-01-14 DIAGNOSIS — I7102 Dissection of abdominal aorta: Secondary | ICD-10-CM

## 2018-01-14 DIAGNOSIS — T501X6A Underdosing of loop [high-ceiling] diuretics, initial encounter: Secondary | ICD-10-CM | POA: Diagnosis present

## 2018-01-14 DIAGNOSIS — I82B12 Acute embolism and thrombosis of left subclavian vein: Secondary | ICD-10-CM | POA: Diagnosis present

## 2018-01-14 DIAGNOSIS — N2581 Secondary hyperparathyroidism of renal origin: Secondary | ICD-10-CM | POA: Diagnosis present

## 2018-01-14 DIAGNOSIS — D631 Anemia in chronic kidney disease: Secondary | ICD-10-CM | POA: Diagnosis present

## 2018-01-14 DIAGNOSIS — E872 Acidosis: Secondary | ICD-10-CM | POA: Diagnosis present

## 2018-01-14 DIAGNOSIS — M7989 Other specified soft tissue disorders: Secondary | ICD-10-CM

## 2018-01-14 DIAGNOSIS — Z8249 Family history of ischemic heart disease and other diseases of the circulatory system: Secondary | ICD-10-CM | POA: Diagnosis not present

## 2018-01-14 DIAGNOSIS — I7101 Dissection of thoracic aorta: Secondary | ICD-10-CM | POA: Diagnosis present

## 2018-01-14 LAB — BASIC METABOLIC PANEL
Anion gap: 12 (ref 5–15)
BUN: 54 mg/dL — AB (ref 6–20)
CHLORIDE: 109 mmol/L (ref 98–111)
CO2: 15 mmol/L — AB (ref 22–32)
CREATININE: 7.4 mg/dL — AB (ref 0.44–1.00)
Calcium: 8.5 mg/dL — ABNORMAL LOW (ref 8.9–10.3)
GFR calc Af Amer: 8 mL/min — ABNORMAL LOW (ref >60)
GFR calc non Af Amer: 7 mL/min — ABNORMAL LOW (ref >60)
Glucose, Bld: 102 mg/dL — ABNORMAL HIGH (ref 70–99)
Potassium: 3.9 mmol/L (ref 3.5–5.1)
Sodium: 136 mmol/L (ref 135–145)

## 2018-01-14 LAB — URINALYSIS, ROUTINE W REFLEX MICROSCOPIC
BILIRUBIN URINE: NEGATIVE
Glucose, UA: NEGATIVE mg/dL
Ketones, ur: NEGATIVE mg/dL
Nitrite: NEGATIVE
Protein, ur: >=300 mg/dL — AB
Specific Gravity, Urine: 1.015 (ref 1.005–1.030)
pH: 5 (ref 5.0–8.0)

## 2018-01-14 LAB — TYPE AND SCREEN
ABO/RH(D): O POS
Antibody Screen: NEGATIVE

## 2018-01-14 LAB — HEMOGLOBIN AND HEMATOCRIT, BLOOD
HCT: 35.7 % — ABNORMAL LOW (ref 36.0–46.0)
HEMATOCRIT: 37.1 % (ref 36.0–46.0)
HEMATOCRIT: 33.8 % — AB (ref 36.0–46.0)
HEMOGLOBIN: 11.5 g/dL — AB (ref 12.0–15.0)
HEMOGLOBIN: 10.6 g/dL — AB (ref 12.0–15.0)
Hemoglobin: 11.3 g/dL — ABNORMAL LOW (ref 12.0–15.0)

## 2018-01-14 LAB — CBC
HCT: 34.9 % — ABNORMAL LOW (ref 36.0–46.0)
Hemoglobin: 11.1 g/dL — ABNORMAL LOW (ref 12.0–15.0)
MCH: 29.1 pg (ref 26.0–34.0)
MCHC: 31.8 g/dL (ref 30.0–36.0)
MCV: 91.6 fL (ref 78.0–100.0)
PLATELETS: 180 10*3/uL (ref 150–400)
RBC: 3.81 MIL/uL — ABNORMAL LOW (ref 3.87–5.11)
RDW: 16.1 % — AB (ref 11.5–15.5)
WBC: 5.1 10*3/uL (ref 4.0–10.5)

## 2018-01-14 LAB — RAPID URINE DRUG SCREEN, HOSP PERFORMED
AMPHETAMINES: NOT DETECTED
Barbiturates: NOT DETECTED
Benzodiazepines: NOT DETECTED
Cocaine: POSITIVE — AB
Opiates: NOT DETECTED
Tetrahydrocannabinol: NOT DETECTED

## 2018-01-14 LAB — MAGNESIUM: Magnesium: 2.5 mg/dL — ABNORMAL HIGH (ref 1.7–2.4)

## 2018-01-14 LAB — PHOSPHORUS: Phosphorus: 5.5 mg/dL — ABNORMAL HIGH (ref 2.5–4.6)

## 2018-01-14 LAB — POC URINE PREG, ED: PREG TEST UR: NEGATIVE

## 2018-01-14 LAB — MRSA PCR SCREENING: MRSA by PCR: NEGATIVE

## 2018-01-14 MED ORDER — HYDRALAZINE HCL 50 MG PO TABS
100.0000 mg | ORAL_TABLET | Freq: Three times a day (TID) | ORAL | Status: DC
  Administered 2018-01-14 – 2018-01-23 (×27): 100 mg via ORAL
  Filled 2018-01-14 (×29): qty 2

## 2018-01-14 MED ORDER — SODIUM CHLORIDE 0.9 % IV SOLN
250.0000 mL | INTRAVENOUS | Status: DC | PRN
  Administered 2018-01-14 – 2018-01-17 (×3): 250 mL via INTRAVENOUS

## 2018-01-14 MED ORDER — ACETAMINOPHEN 325 MG PO TABS
650.0000 mg | ORAL_TABLET | Freq: Four times a day (QID) | ORAL | Status: DC | PRN
  Administered 2018-01-14: 650 mg via ORAL
  Filled 2018-01-14: qty 2

## 2018-01-14 MED ORDER — FUROSEMIDE 80 MG PO TABS
80.0000 mg | ORAL_TABLET | Freq: Two times a day (BID) | ORAL | Status: DC
  Filled 2018-01-14: qty 1

## 2018-01-14 MED ORDER — FUROSEMIDE 10 MG/ML IJ SOLN
120.0000 mg | Freq: Two times a day (BID) | INTRAVENOUS | Status: DC
  Administered 2018-01-14 – 2018-01-16 (×5): 120 mg via INTRAVENOUS
  Filled 2018-01-14: qty 12
  Filled 2018-01-14: qty 2
  Filled 2018-01-14: qty 10
  Filled 2018-01-14 (×3): qty 12
  Filled 2018-01-14: qty 2
  Filled 2018-01-14: qty 12

## 2018-01-14 MED ORDER — CLONIDINE HCL 0.2 MG PO TABS
0.2000 mg | ORAL_TABLET | Freq: Three times a day (TID) | ORAL | Status: DC
  Administered 2018-01-14 (×3): 0.2 mg via ORAL
  Filled 2018-01-14 (×3): qty 1

## 2018-01-14 MED ORDER — ESMOLOL HCL-SODIUM CHLORIDE 2000 MG/100ML IV SOLN
25.0000 ug/kg/min | Freq: Once | INTRAVENOUS | Status: AC
  Administered 2018-01-14: 25 ug/kg/min via INTRAVENOUS
  Filled 2018-01-14: qty 100

## 2018-01-14 MED ORDER — SODIUM BICARBONATE 650 MG PO TABS
650.0000 mg | ORAL_TABLET | Freq: Two times a day (BID) | ORAL | Status: DC
  Administered 2018-01-14: 650 mg via ORAL
  Filled 2018-01-14: qty 1

## 2018-01-14 MED ORDER — NICARDIPINE HCL IN NACL 20-0.86 MG/200ML-% IV SOLN
3.0000 mg/h | INTRAVENOUS | Status: DC
  Administered 2018-01-14: 12.5 mg/h via INTRAVENOUS
  Administered 2018-01-14: 7.5 mg/h via INTRAVENOUS
  Administered 2018-01-14: 5 mg/h via INTRAVENOUS
  Administered 2018-01-14: 12.5 mg/h via INTRAVENOUS
  Administered 2018-01-15 (×2): 5 mg/h via INTRAVENOUS
  Filled 2018-01-14 (×5): qty 200

## 2018-01-14 MED ORDER — SODIUM BICARBONATE 650 MG PO TABS
1300.0000 mg | ORAL_TABLET | Freq: Three times a day (TID) | ORAL | Status: DC
  Administered 2018-01-14 – 2018-01-20 (×17): 1300 mg via ORAL
  Filled 2018-01-14 (×18): qty 2

## 2018-01-14 MED ORDER — CALCITRIOL 0.25 MCG PO CAPS
0.2500 ug | ORAL_CAPSULE | Freq: Every day | ORAL | Status: DC
  Administered 2018-01-14 – 2018-01-23 (×9): 0.25 ug via ORAL
  Filled 2018-01-14 (×10): qty 1

## 2018-01-14 NOTE — Consult Note (Signed)
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  Would give this a few weeks to continue to heal if remains swollen will need left central venogram or fistula ligation  Meanwhile elevate left arm above level of heart as much as possible to control symptoms  If dialysis is initiated this admission may consider central venogram prior to d/c  [facility] 01/14/2018 9:54 AM --  Laboratory Lab Results: Recent Labs    01/13/18 2213 01/14/18 0643  WBC 4.0  --   HGB 11.1* 11.5*  HCT 36.0 37.1  PLT 165  --    BMET Recent Labs    01/13/18 2213  NA 137  K 4.4  CL 109  CO2 14*  GLUCOSE 119*  BUN 54*  CREATININE 7.16*  CALCIUM 8.8*    COAG Lab Results  Component Value Date   INR 1.19 11/29/2017   No results found for: PTT

## 2018-01-14 NOTE — Plan of Care (Signed)
  Problem: Elimination: Goal: Will not experience complications related to urinary retention Outcome: Progressing Note:  Patient has decreased urge to void. RN performing routine bladder scans. RN encouraged the patient to attempt to void and the patient was successful. Will continue to monitor and encourage voiding.

## 2018-01-14 NOTE — Progress Notes (Signed)
Preliminary notes--Left upper extremity venous duplex exam completed. Subclavian branch partially thrombosed. Other veins are patent with flow. Limited study.  A  2.52x1.84x3.78cm complex fluid collection seen at the medial portion of the [facility] fossa where dialysis access located.  [place]ong[place] Landry Mellow (RDMS RVT) 01/14/18 2:29 PM

## 2018-01-14 NOTE — H&P (Addendum)
Valerie Mathis  URK:[id number] DOB: [birth year] DOA: 01/13/2018 PCP: Patient, No Pcp Per    LOS: 0 days   Reason for Consult / Chief Complaint:  Hypertensive Emergency   Consulting MD and date of consult:  Dr. 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   Patient states she has been in normal state of health since last discharge on 8/10, in which she had the first surgery for LUE fistula, except for chronic exertional dyspnea.  She reports that she has been out of her lasix and hydralazine for over a week because they were too expensive.  She notes that she has had ongoing swelling of her left arm into her left breast since discharge.  She denies any headache, SOB, chest pain, abdominal pain, fever, syncope, bleeding, oliguria, nausea or vomiting.   Additionally, patient reports recent cocaine use 3 days ago.    On arrival to ER, she was hypertensive at 233/151, otherwise she has been afebrile and stable on room air.  CT renal study noted propagation of her descending thoracic aortic dissection when compared to prior MRI in 11/2017.  CT findings called to Dr. Ricard Dillon with TCTS who will see her later this morning but was not a surgical problem at this time and recommended strict blood pressure management.  She was placed on esmolol gtt in ER and PCCM called for admit.   Subjective  No complaints, was sleeping.   Assessment & Plan:  Hypertensive emergency  Chronic diastolic HF - in the setting of cocaine abuse and out of 2/5 (lasix/ hydralazine) home BP meds  - chronic exertional dyspnea per patient - TTE from 11/2017 shows EF 55-60%, G2DD, PAP 42 mm Hg - CXR with small right pleural effusion; bibasilar atelectasis noted on CT; no overt  edema  P:  Admit to ICU, tele monitoring D/c esmolol w/ recent cocaine use Cardene gtt for SBP < 160 Lasix 120 mg BID    Descending thoracic aortic dissection s/p graft repair 2018 -with extension of her descending thoracic aortic dissection, not previously seen on July MRI - H/H stable  P:  TCTS to see in am  Will likely need vascular consult in am  Type and screen Trend H/H q 8 hours    Mild AKI on CKD stage IV/V - CO2 14 - anasarca  - sCr 6.86 -> 7.16 - s/p first surgery 8/9 for LUE AVF P:  Will need Nephrology consult in am Sodium bicarb 650 mg tablets BID  Lasix as above Trend BMP / mag/ phos/ daily wt/ urinary output   LUE swelling - s/p first surgery of AVF 8/9 P:  Diuresis as above Vascular u/s to r/o DVT  Cocaine Abuse P:  Ongoing cesesation counseling   Best Practice / Goals of Care / Disposition.   DVT prophylaxis: SCDs  GI prophylaxis: n/a Diet: NPO Mobility: bedrest  Code Status: full  Family Communication: no family at bedside.  Patient updated on plan of care   Disposition / Summary of Today's Plan 01/14/18   Admit to ICU for stabilization of hypertensive emergency   Consultants: date of consult/date signed off (if applicable)/final recs  TCTS - by EDP   - will need Vascular and Nephrology in  am   Procedures:  Significant Diagnostic Tests: 8/31 CT renal study >> 1. Descending thoracic aortic dissection with apparent propagation from prior MRI given differences in imaging technique. 2. Cardiomegaly. Moderate RIGHT and small LEFT pleural effusions with bibasilar atelectasis. 3. Small to moderate volume ascites.  Anasarca. 4. Hepatomegaly.  Micro Data: 8/31 MRSA PCR>>  Antimicrobials:   Objective    Examination: General:  Pleasant, young AAF lying in bed in NAD HEENT: MM pink/moist, pupils 4/reactive, anicertic Neuro:  Alert, oriented, MAE CV: SR 63 (on esmolol), +murmur PULM: even/non-labored, lungs bilaterally clear  anteriorly, left basilar crackles  GI: soft, non-tender, bs active  Extremities: warm/dry, LUE with +4 swelling extending into left breast, BLE 2-3+ edema  Skin: no rashes  Blood pressure (!) 211/130, pulse 66, temperature 98.2 F (36.8 C), temperature source Oral, resp. rate 16, weight 60 kg, last menstrual period 12/15/2017, SpO2 99 %.       No intake or output data in the 24 hours ending 01/14/18 0535 Filed Weights   01/14/18 0400  Weight: 60 kg    Labs    CBC: Recent Labs  Lab 01/13/18 2213  WBC 4.0  HGB 11.1*  HCT 36.0  MCV 93.3  PLT 465   Basic Metabolic Panel: Recent Labs  Lab 01/13/18 2213  NA 137  K 4.4  CL 109  CO2 14*  GLUCOSE 119*  BUN 54*  CREATININE 7.16*  CALCIUM 8.8*   GFR: Estimated Creatinine Clearance: 9.6 mL/min (A) (by C-G formula based on SCr of 7.16 mg/dL (H)). Recent Labs  Lab 01/13/18 2213  WBC 4.0   Liver Function Tests: No results for input(s): AST, ALT, ALKPHOS, BILITOT, PROT, ALBUMIN in the last 168 hours. No results for input(s): LIPASE, AMYLASE in the last 168 hours. No results for input(s): AMMONIA in the last 168 hours. ABG    Component Value Date/Time   TCO2 20 (L) 12/17/2017 1139    Coagulation Profile: No results for input(s): INR, PROTIME in the last 168 hours. Cardiac Enzymes: No results for input(s): CKTOTAL, CKMB, CKMBINDEX, TROPONINI in the last 168 hours. HbA1C: Hgb A1c MFr Bld  Date/Time Value Ref Range Status  10/20/2017 02:28 AM 5.2 4.8 - 5.6 % Final    Comment:    (NOTE) Pre diabetes:          5.7%-6.4% Diabetes:              >6.4% Glycemic control for   <7.0% adults with diabetes    CBG: No results for input(s): GLUCAP in the last 168 hours.   Review of Systems:   As per HPI otherwise negative.    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   Surgical History    Past Surgical History:  Procedure Laterality Date  . AV FISTULA PLACEMENT Left 12/23/2017   Procedure: ARTERIOVENOUS (AV) FISTULA CREATION  LEFT ARM;  Surgeon: Rosetta Posner, MD;  Location: [place];  Service: Vascular;  Laterality: Left;  . ECTOPIC PREGNANCY SURGERY  2018  . REPAIR OF ACUTE ASCENDING THORACIC AORTIC DISSECTION       Social History   Social History   Socioeconomic History  . Marital status: Single    Spouse name: Not on file  . Number of children: Not on file  .  Years of education: Not on file  . Highest education level: Not on file  Occupational History  . Not on file  Social Needs  . Financial resource strain: Not on file  . Food insecurity:    Worry: Not on file    Inability: Not on file  . Transportation needs:    Medical: Not on file    Non-medical: Not on file  Tobacco Use  . Smoking status: Current Every Day Smoker    Packs/day: 0.50    Years: 4.00    Pack years: 2.00    Types: Cigarettes  . Smokeless tobacco: Never Used  Substance and Sexual Activity  . Alcohol use: Yes    Comment: OCCASIONAL  . Drug use: Yes    Types: Cocaine, Benzodiazepines    Comment: Last use 08/2017  . Sexual activity: Yes  Lifestyle  . Physical activity:    Days per week: Not on file    Minutes per session: Not on file  . Stress: Not on file  Relationships  . Social connections:    Talks on phone: Not on file    Gets together: Not on file    Attends religious service: Not on file    Active member of club or organization: Not on file    Attends meetings of clubs or organizations: Not on file    Relationship status: Not on file  . Intimate partner violence:    Fear of current or ex partner: Not on file    Emotionally abused: Not on file    Physically abused: Not on file    Forced sexual activity: Not on file  Other Topics Concern  . Not on file    Social History Narrative  . Not on file  ,  reports that she has been smoking cigarettes. She has a 2.00 pack-year smoking history. She has never used smokeless tobacco. She reports that she drinks alcohol. She reports that she has current or past drug history. Drugs: Cocaine and Benzodiazepines.   Family history   Her family history includes Hypertension in her mother.   Allergies No Known Allergies  Home meds  Prior to Admission medications   Medication Sig Start Date End Date Taking? Authorizing Provider  amLODipine (NORVASC) 10 MG tablet Take 1 tablet (10 mg total) by mouth daily. for high blood pressure 12/03/17  Yes Lavina Hamman, MD  calcitRIOL (ROCALTROL) 0.25 MCG capsule Take 1 capsule (0.25 mcg total) by mouth daily. 12/24/17  Yes Patrecia Pour, MD  cloNIDine (CATAPRES) 0.2 MG tablet Take 1 tablet (0.2 mg total) by mouth 3 (three) times daily. 12/24/17  Yes Patrecia Pour, MD  ferrous sulfate 325 (65 FE) MG tablet Take 1 tablet (325 mg total) by mouth 2 (two) times daily with a meal. 12/24/17  Yes Patrecia Pour, MD  furosemide (LASIX) 80 MG tablet Take 1 tablet (80 mg total) by mouth 2 (two) times daily. 12/24/17  Yes Patrecia Pour, MD  hydrALAZINE (APRESOLINE) 100 MG tablet Take 1 tablet (100 mg total) by mouth 3 (three) times daily. 12/24/17  Yes Patrecia Pour, MD  isosorbide mononitrate (IMDUR) 120 MG 24 hr tablet Take 1 tablet (120 mg total) by mouth daily. 12/25/17  Yes Patrecia Pour, MD  sodium bicarbonate 650 MG tablet Take 1 tablet (650 mg total) by mouth 2 (two) times daily. 12/24/17  Yes Patrecia Pour, MD     CCT 60 mins  Kennieth Rad, AGACNP-BC [facility] Pgr: [phone] or if no answer [phone] 01/14/2018, 6:35 AM

## 2018-01-14 NOTE — ED Notes (Signed)
Attempted iv in right arm no success. Left arm is restricted

## 2018-01-14 NOTE — ED Provider Notes (Signed)
[facility] EMERGENCY DEPARTMENT Provider Note   CSN: [id number] Arrival date & time: 01/13/18  2200     History   Chief Complaint Chief Complaint  Patient presents with  . Flank Pain    HPI Valerie Mathis is a 32 y.o. female.  Patient is a 32 year old female with past medical history of chronic renal insufficiency, hypertension, aortic dissection status post repair.  She presents today for evaluation of pain in her right flank.  This started this afternoon at approximately 4 PM while she was walking down the stairs.  She reports pain in her right flank that is worse with movement.  She denies any burning with urination.  She denies any blood in her urine.  She denies any fevers.     Past Medical History:  Diagnosis Date  . Aortic dissection ([place])   . CHF (congestive heart failure) ([place])   . CKD (chronic kidney disease) stage 4, GFR 15-29 ml/min (HCC)   . H/O: alcohol abuse   . Hypertension   . Renal disorder   . TTP (thrombotic thrombocytopenic purpura) [facility])     Patient Active Problem List   Diagnosis Date Noted  . Hypertensive urgency 11/29/2017  . ARF (acute renal failure) ([place]) 11/29/2017  . History of LUE DVT (deep vein thrombosis) 10/21/2017  . Abdominal pain 10/20/2017  . Uncontrolled hypertension 10/20/2017  . Hypertensive cardiomyopathy ([place]) 10/20/2017  . Aortic dissection [place] type B s/p repair 2018 ([place])   . Hypertensive heart disease without heart failure   . Cocaine use   . Hypertensive emergency 09/12/2017  . Back pain 09/12/2017  . Tachycardia 09/12/2017  . Chronic kidney disease Stage IV-V   . 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 AV FISTULA PLACEMENT Left 12/23/2017   Procedure: ARTERIOVENOUS (AV) FISTULA CREATION  LEFT ARM;  Surgeon: Rosetta Posner, MD;  Location: [place];  Service: Vascular;  Laterality: Left;  . ECTOPIC PREGNANCY SURGERY  2018  . REPAIR OF ACUTE ASCENDING  THORACIC AORTIC DISSECTION       OB History   None      Home Medications    Prior to Admission medications   Medication Sig Start Date End Date Taking? Authorizing Provider  amLODipine (NORVASC) 10 MG tablet Take 1 tablet (10 mg total) by mouth daily. for high blood pressure 12/03/17  Yes Lavina Hamman, MD  calcitRIOL (ROCALTROL) 0.25 MCG capsule Take 1 capsule (0.25 mcg total) by mouth daily. 12/24/17  Yes Patrecia Pour, MD  cloNIDine (CATAPRES) 0.2 MG tablet Take 1 tablet (0.2 mg total) by mouth 3 (three) times daily. 12/24/17  Yes Patrecia Pour, MD  ferrous sulfate 325 (65 FE) MG tablet Take 1 tablet (325 mg total) by mouth 2 (two) times daily with a meal. 12/24/17  Yes Patrecia Pour, MD  furosemide (LASIX) 80 MG tablet Take 1 tablet (80 mg total) by mouth 2 (two) times daily. 12/24/17  Yes Patrecia Pour, MD  hydrALAZINE (APRESOLINE) 100 MG tablet Take 1 tablet (100 mg total) by mouth 3 (three) times daily. 12/24/17  Yes Patrecia Pour, MD  isosorbide mononitrate (IMDUR) 120 MG 24 hr tablet Take 1 tablet (120 mg total) by mouth daily. 12/25/17  Yes Patrecia Pour, MD  sodium bicarbonate 650 MG tablet Take 1 tablet (650 mg total) by mouth 2 (two) times daily. 12/24/17  Yes Patrecia Pour, MD    Family History  Family History  Problem Relation Age of Onset  . Hypertension Mother     Social History Social History   Tobacco Use  . Smoking status: Current Every Day Smoker    Packs/day: 0.50    Years: 4.00    Pack years: 2.00    Types: Cigarettes  . Smokeless tobacco: Never Used  Substance Use Topics  . Alcohol use: Yes    Comment: OCCASIONAL  . Drug use: Yes    Types: Cocaine, Benzodiazepines    Comment: Last use 08/2017     Allergies   Patient has no known allergies.   Review of Systems Review of Systems  All other systems reviewed and are negative.    Physical Exam Updated Vital Signs BP (!) 231/145 (BP Location: Right Arm)   Pulse 72   Temp 98.2 F (36.8 C) (Oral)    Resp 16   LMP 12/15/2017   SpO2 98%   Physical Exam  Constitutional: She is oriented to person, place, and time. She appears well-developed and well-nourished. No distress.  HENT:  Head: Normocephalic and atraumatic.  Neck: Normal range of motion. Neck supple.  Cardiovascular: Normal rate and regular rhythm. Exam reveals no gallop and no friction rub.  No murmur heard. Pulmonary/Chest: Effort normal and breath sounds normal. No respiratory distress. She has no wheezes.  Abdominal: Soft. Bowel sounds are normal. She exhibits no distension. There is no tenderness.  Musculoskeletal: Normal range of motion.  Neurological: She is alert and oriented to person, place, and time.  Skin: Skin is warm and dry. She is not diaphoretic.  Nursing note and vitals reviewed.    ED Treatments / Results  Labs (all labs ordered are listed, but only abnormal results are displayed) Labs Reviewed  CBC - Abnormal; Notable for the following components:      Result Value   RBC 3.86 (*)    Hemoglobin 11.1 (*)    RDW 16.2 (*)    All other components within normal limits  BASIC METABOLIC PANEL - Abnormal; Notable for the following components:   CO2 14 (*)    Glucose, Bld 119 (*)    BUN 54 (*)    Creatinine, Ser 7.16 (*)    Calcium 8.8 (*)    GFR calc non Af Amer 7 (*)    GFR calc Af Amer 8 (*)    All other components within normal limits  URINALYSIS, ROUTINE W REFLEX MICROSCOPIC    EKG None  Radiology No results found.  Procedures Procedures (including critical care time)  Medications Ordered in ED Medications  oxyCODONE-acetaminophen (PERCOCET/ROXICET) 5-325 MG per tablet 1 tablet (1 tablet Oral Given 01/13/18 2217)     Initial Impression / Assessment and Plan / ED Course  I have reviewed the triage vital signs and the nursing notes.  Pertinent labs & imaging results that were available during my care of the patient were reviewed by me and considered in my medical decision making  (see chart for details).  Patient with history of uncontrolled hypertension, end-stage renal disease, and surgery for repair of a sending aortic aneurysm presenting with flank pain, elevated blood pressure.  A renal CT was obtained over concern for possible renal calculus.  This returned showing propagation of the aortic dissection into the abdomen, further than on prior MRA.  This finding was discussed with Dr. Roxy Manns from thoracic surgery who does not feel as though this is a surgical issue and that the patient should be admitted to the intensive care  unit for aggressive blood pressure control.  She was started on an esmolol drip, then changed to Cardene by critical care secondary to the patient recently using cocaine.  She will be admitted the PCCM service.  CRITICAL CARE Performed by: Veryl Speak Total critical care time: 45 minutes Critical care time was exclusive of separately billable procedures and treating other patients. Critical care was necessary to treat or prevent imminent or life-threatening deterioration. Critical care was time spent personally by me on the following activities: development of treatment plan with patient and/or surrogate as well as nursing, discussions with consultants, evaluation of patient's response to treatment, examination of patient, obtaining history from patient or surrogate, ordering and performing treatments and interventions, ordering and review of laboratory studies, ordering and review of radiographic studies, pulse oximetry and re-evaluation of patient's condition.   Final Clinical Impressions(s) / ED Diagnoses   Final diagnoses:  None    ED Discharge Orders    None       Veryl Speak, MD 01/14/18 [phone]

## 2018-01-14 NOTE — Consult Note (Signed)
Reason for Consult: Chronic kidney disease stage V Referring Physician: June Leap M.D. 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  Admitted yesterday to the hospital with right sided flank pain along with hypertensive urgency 2 days after cocaine use.  Compliance with antihypertensive therapy unfortunately has been spotty.  Seen earlier this month for a similar presentation and on 12/23/2017 underwent first stage left brachiobasilic fistula.  Concern is raised with her higher creatinine and worsening metabolic acidosis and poor urine output overnight.  It was also noted that she had left arm swelling that has been evaluated by Dr. Oneida Alar earlier today and is suspected to be from central venous stenosis.  She denies any hematuria and has intermittent foamy urine.  Denies any headache, shortness of breath or chest pain and did not have any nausea, vomiting, diarrhea or dysgeusia.  Denies any fever or chills.  Past Medical History:  Diagnosis Date  . Aortic dissection ([place])   . CHF (congestive heart failure) ([place])   . CKD (chronic kidney disease) stage 4, GFR 15-29 ml/min (HCC)   . H/O: alcohol abuse   . Hypertension   . Renal disorder   . 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 AV FISTULA PLACEMENT Left 12/23/2017   Procedure: ARTERIOVENOUS (AV) FISTULA CREATION  LEFT ARM;  Surgeon: Rosetta Posner, MD;  Location: [place];  Service: Vascular;  Laterality: Left;  . ECTOPIC PREGNANCY SURGERY  2018  . REPAIR OF ACUTE ASCENDING THORACIC AORTIC DISSECTION      Family History  Problem Relation Age of Onset  . Hypertension Mother     Social History:   reports that she has been smoking cigarettes. She has a 2.00 pack-year smoking history. She has never used smokeless tobacco. She reports that she drinks alcohol. She reports that she has current or past drug history. Drugs: Cocaine and Benzodiazepines.  Allergies: No Known Allergies  Medications:  Scheduled: . cloNIDine  0.2 mg Oral TID  . hydrALAZINE  100 mg Oral Q8H  . sodium bicarbonate  650 mg Oral BID    CBC Latest Ref Rng & Units 01/14/2018 01/14/2018 01/13/2018  WBC 4.0 - 10.5 K/uL 5.1 - 4.0  Hemoglobin 12.0 - 15.0 g/dL 11.1(L) 11.5(L) 11.1(L)  Hematocrit 36.0 - 46.0 % 34.9(L) 37.1 36.0  Platelets 150 - 400 K/uL 180 - 165   BMP Latest Ref Rng & Units 01/14/2018 01/13/2018 12/24/2017  Glucose 70 - 99 mg/dL 102(H) 119(H) 106(H)  BUN 6 - 20 mg/dL 54(H) 54(H) 51(H)  Creatinine 0.44 - 1.00 mg/dL 7.40(H) 7.16(H) 6.86(H)  Sodium 135 - 145 mmol/L 136 137 137  Potassium 3.5 - 5.1 mmol/L 3.9 4.4 4.3  Chloride 98 - 111 mmol/L 109 109 102  CO2 22 - 32 mmol/L 15(L) 14(L) 18(L)  Calcium 8.9 - 10.3 mg/dL 8.5(L) 8.8(L) 7.7(L)     Ct Renal Stone Study  Result Date: 01/14/2018 CLINICAL DATA:  RIGHT back pain, hypertensive. History of end-stage renal disease, type-B aortic dissection, status post repair 2018, hypertension, substance abuse. EXAM: CT ABDOMEN AND PELVIS WITHOUT CONTRAST TECHNIQUE: Multidetector CT imaging of the abdomen and pelvis was performed following the standard protocol without IV contrast. COMPARISON:  MRA abdomen November 29, 2017 FINDINGS:  LOWER CHEST: Moderate cardiomegaly. No pericardial effusion. Moderate RIGHT and small LEFT pleural effusions. Bilateral lower lobe atelectasis. HEPATOBILIARY: Hepatomegaly. Subcentimeter gallstones without CT findings of acute cholecystitis. PANCREAS: Normal. SPLEEN: Normal. ADRENALS/URINARY TRACT: Kidneys are orthotopic, mildly atrophic. No nephrolithiasis, hydronephrosis; limited assessment for renal masses by nonenhanced CT. The unopacified  ureters are normal in course and caliber. Urinary bladder is decompressed and unremarkable. Normal adrenal glands. STOMACH/BOWEL: The stomach, small and large bowel are normal in course and caliber without inflammatory changes, sensitivity decreased by lack of enteric contrast. Normal appendix. VASCULAR/LYMPHATIC: Aortoiliac vessels are normal in course and caliber. Crescentic density descending thoracic aorta are consistent with dissection. REPRODUCTIVE: Normal. OTHER: Small to moderate volume ascites. No intraperitoneal free air MUSCULOSKELETAL: Anasarca. Status post median sternotomy. Paraumbilical scarring. Diffusely sclerotic axial skeleton most compatible with renal osteodystrophy. IMPRESSION: 1. Descending thoracic aortic dissection with apparent propagation from prior MRI given differences in imaging technique. 2. Cardiomegaly. Moderate RIGHT and small LEFT pleural effusions with bibasilar atelectasis. 3. Small to moderate volume ascites.  Anasarca. 4. Hepatomegaly. 5. Acute findings discussed with and reconfirmed by Dr.DOUGLAS DELO on 01/14/2018 at 4:38 am. Aortic Atherosclerosis (ICD10-I70.0). Electronically Signed   By: Elon Alas M.D.   On: 01/14/2018 04:38    Review of Systems  Constitutional: Negative for chills, fever and malaise/fatigue.  HENT: Negative.   Eyes: Negative.   Respiratory: Negative.   Cardiovascular: Positive for leg swelling. Negative for chest pain, palpitations and orthopnea.  Gastrointestinal: Negative.   Genitourinary: Positive for flank pain. Negative for dysuria, frequency and urgency.       Right  Musculoskeletal:       Left arm swelling  Skin: Negative.   Neurological: Positive for headaches. Negative for dizziness, tremors and speech change.   Blood pressure 126/61, pulse 83, temperature (!) 97.4 F (36.3 C), temperature source Oral, resp. rate (!) 21, height 5\' 3"  (1.6 m), weight 65.2 kg, last menstrual period 12/15/2017, SpO2 96 %. Physical Exam   Nursing note and vitals reviewed. Constitutional: She is oriented to person, place, and time. She appears well-developed and well-nourished. No distress.  HENT:  Head: Normocephalic and atraumatic.  Mouth/Throat: Oropharynx is clear and moist.  Eyes: Pupils are equal, round, and reactive to light. Conjunctivae and EOM are normal. No scleral icterus.  Neck: Normal range of motion. Neck supple. No JVD present.  Cardiovascular: Normal rate, regular rhythm and normal heart sounds.  No murmur heard. Respiratory: Effort normal and breath sounds normal. She has no wheezes. She has no rales.  GI: Soft. Bowel sounds are normal. She exhibits no distension. There is no tenderness. There is no rebound.  Musculoskeletal: She exhibits edema.  2+ LE edema. LUA swelling noted with low pitched bruit  Neurological: She is alert and oriented to person, place, and time.  No asterixis  Skin: Skin is warm and dry. No erythema.    Assessment/Plan: 1.  Chronic kidney disease stage V: With progressive chronic kidney disease secondary to hypertension and continued polysubstance abuse.  She is mildly hypervolemic on physical exam and diuretics will be restarted.  She does not have any uremic symptoms or acute electrolyte abnormalities to warrant acute initiation of hemodialysis at this time.  We will continue to closely follow urine output/labs. 2.  Hypertensive urgency: She has been weaned off of intravenous antihypertensive agents and is been transitioned to oral agents at this time. 3.  Anion gap metabolic acidosis with non-gap component: Likely secondary to her progressive kidney disease and acute worsening  of renal function.  Agree with restarting oral sodium bicarbonate-dosing adjusted. 4.  Polysubstance abuse: Discussed the need for abstinence with the goal being able to successfully undergo renal transplantation at some point. 5.  Anemia: Hemoglobin/hematocrit within acceptable range-continue to monitor for  ESA. 6.  Secondary hyperparathyroidism: Phosphorus acceptable at 5.5, discussed low phosphorus diet.  PTH significantly elevated at 401-begin calcitriol.  Valerie Mathis K. 01/14/2018, 1:15 PM

## 2018-01-14 NOTE — Consult Note (Addendum)
CARDIOTHORACIC SURGERY BRIEF CONSULT NOTE  I have examined the patient and personally reviewed the patient's chart and the limited CT scan performed in ED Please see consult note in Epic from 09/12/2017 There is nothing further to add from our standpoint as the patient's thoracic aorta has not been imaged and the patient does not have symptoms suggestive of problems related to the thoracic aorta Complications regarding propagation of a chronic dissection involving the abdominal aorta are usually managed medically, with occasional need for surgical intervention by Vascular Surgery in selected circumstances.    Plan:   Per Vascular Surgery and medical teams.  Long term follow up for aortic dissection per Vascular Surgery.  Overall long term prognosis appears guarded.  This patient doesn't have insurance and apparently has not had regular follow up for management of her hypertension.  This is the 5th time she's been hospitalized in the last 6 months.  Social work consult might be helpful.  Rexene Alberts, MD 01/14/2018 6:10 PM

## 2018-01-15 LAB — HEMOGLOBIN AND HEMATOCRIT, BLOOD
HCT: 32 % — ABNORMAL LOW (ref 36.0–46.0)
HEMATOCRIT: 33.2 % — AB (ref 36.0–46.0)
HEMOGLOBIN: 10.5 g/dL — AB (ref 12.0–15.0)
HEMOGLOBIN: 10.4 g/dL — AB (ref 12.0–15.0)

## 2018-01-15 LAB — CBC
HCT: 32.8 % — ABNORMAL LOW (ref 36.0–46.0)
HEMOGLOBIN: 10.6 g/dL — AB (ref 12.0–15.0)
MCH: 29 pg (ref 26.0–34.0)
MCHC: 32.3 g/dL (ref 30.0–36.0)
MCV: 89.6 fL (ref 78.0–100.0)
Platelets: 199 10*3/uL (ref 150–400)
RBC: 3.66 MIL/uL — AB (ref 3.87–5.11)
RDW: 16.1 % — ABNORMAL HIGH (ref 11.5–15.5)
WBC: 5.5 10*3/uL (ref 4.0–10.5)

## 2018-01-15 LAB — RENAL FUNCTION PANEL
ANION GAP: 9 (ref 5–15)
Albumin: 3 g/dL — ABNORMAL LOW (ref 3.5–5.0)
BUN: 54 mg/dL — ABNORMAL HIGH (ref 6–20)
CALCIUM: 7.7 mg/dL — AB (ref 8.9–10.3)
CO2: 17 mmol/L — AB (ref 22–32)
CREATININE: 7.68 mg/dL — AB (ref 0.44–1.00)
Chloride: 110 mmol/L (ref 98–111)
GFR, EST AFRICAN AMERICAN: 7 mL/min — AB (ref >60)
GFR, EST NON AFRICAN AMERICAN: 6 mL/min — AB (ref >60)
Glucose, Bld: 93 mg/dL (ref 70–99)
PHOSPHORUS: 4.5 mg/dL (ref 2.5–4.6)
Potassium: 3.6 mmol/L (ref 3.5–5.1)
Sodium: 136 mmol/L (ref 135–145)

## 2018-01-15 LAB — MAGNESIUM: Magnesium: 2.2 mg/dL (ref 1.7–2.4)

## 2018-01-15 MED ORDER — HEPARIN SODIUM (PORCINE) 5000 UNIT/ML IJ SOLN
5000.0000 [IU] | Freq: Three times a day (TID) | INTRAMUSCULAR | Status: DC
  Administered 2018-01-15 – 2018-01-19 (×10): 5000 [IU] via SUBCUTANEOUS
  Filled 2018-01-15 (×10): qty 1

## 2018-01-15 MED ORDER — AMLODIPINE BESYLATE 10 MG PO TABS
10.0000 mg | ORAL_TABLET | Freq: Every day | ORAL | Status: DC
  Administered 2018-01-15 – 2018-01-16 (×3): 10 mg via ORAL
  Filled 2018-01-15 (×3): qty 1

## 2018-01-15 MED ORDER — CLONIDINE HCL 0.1 MG PO TABS
0.1000 mg | ORAL_TABLET | Freq: Three times a day (TID) | ORAL | Status: DC
  Administered 2018-01-15 – 2018-01-16 (×4): 0.1 mg via ORAL
  Filled 2018-01-15 (×4): qty 1

## 2018-01-15 MED ORDER — LABETALOL HCL 5 MG/ML IV SOLN
10.0000 mg | INTRAVENOUS | Status: DC | PRN
  Administered 2018-01-15 – 2018-01-18 (×11): 10 mg via INTRAVENOUS
  Filled 2018-01-15 (×11): qty 4

## 2018-01-15 MED ORDER — LABETALOL HCL 100 MG PO TABS
100.0000 mg | ORAL_TABLET | Freq: Two times a day (BID) | ORAL | Status: DC
  Administered 2018-01-15 – 2018-01-16 (×4): 100 mg via ORAL
  Filled 2018-01-15 (×4): qty 1

## 2018-01-15 NOTE — Progress Notes (Signed)
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  Will need central venogram requiring contrast which may tip her to HD but this is probably inevitable regardless will discuss with Dr Posey Pronto.  If he agrees central venogram Tuesday 9/3  Aortic dissection seen on one cut of a renal stone CT and no real symptoms.  I would not pursue further work up of this unless clinical condition changes.  Needs better BP control maintain SBP less than 140 has a lot of room on heart rate.  Will leave at discretion of primary service and renal but probably would benefit from labetalol to protect from further dissection and increase BP control.  Ruta Hinds 01/15/2018 8:00 AM --  Laboratory Lab Results: Recent Labs    01/14/18 0939  01/14/18 2302 01/15/18 0619  WBC 5.1  --   --  5.5  HGB 11.1*   < > 11.3* 10.6*  HCT 34.9*   < > 35.7* 32.8*  PLT 180  --   --  199   < > = values in this interval not displayed.   BMET Recent Labs    01/14/18 0939 01/15/18 0619  NA 136 136  K 3.9 3.6  CL 109 110  CO2 15* 17*  GLUCOSE 102* 93  BUN 54* 54*  CREATININE 7.40* 7.68*  CALCIUM 8.5* 7.7*    COAG Lab Results  Component Value Date   INR 1.19 11/29/2017   No results found for: PTT

## 2018-01-15 NOTE — Plan of Care (Signed)
  Problem: Education: Goal: Knowledge of General Education information will improve Description Including pain rating scale, medication(s)/side effects and non-pharmacologic comfort measures Outcome: Progressing   Problem: Health Behavior/Discharge Planning: Goal: Ability to manage health-related needs will improve Outcome: Progressing   Problem: Clinical Measurements: Goal: Ability to maintain clinical measurements within normal limits will improve Outcome: Progressing Goal: Will remain free from infection Outcome: Progressing Goal: Diagnostic test results will improve Outcome: Progressing Goal: Cardiovascular complication will be avoided Outcome: Progressing   Problem: Activity: Goal: Risk for activity intolerance will decrease Outcome: Progressing   Problem: Pain Managment: Goal: General experience of comfort will improve Outcome: Progressing   Problem: Safety: Goal: Ability to remain free from injury will improve Outcome: Progressing

## 2018-01-15 NOTE — Progress Notes (Signed)
Valerie Mathis  PFX:[id number] DOB: January 06, 1986 DOA: 01/13/2018 PCP: Patient, No Pcp Per    LOS: 1 day   Reason for Consult / Chief Complaint:  Hypertensive Emergency   Consulting MD and date of consult:  Dr. Stark Jock  Summary:  32 year old female with PMH of type B aortic dissection s/p graft repair 2018 in [facility], diastolic HF, CKD stage IV/V, HTN, cocaine and tobacco abuse, TTP and limited medical adherence who presented to the ER with complaints of sudden onset right flank pain. Found to have progression of thoracic aortic dissection, worsening AKI, and volume overload.     Subjective  9/1: states that her flank pain is now resolved.  Denies any other complaints, no chest pain, vision changes, headache, dyspnea, nausea, vomiting, or diarrhea   Significant Events  8/31: admitted  Significant Diagnostic Tests:   8/31 CT renal study >> 1. Descending thoracic aortic dissection with apparent propagation from prior MRI given differences in imaging technique. 2. Cardiomegaly. Moderate RIGHT and small LEFT pleural effusions with bibasilar atelectasis. 3. Small to moderate volume ascites.  Anasarca. 4. Hepatomegaly.  8/31 LUE dopplers>>  Assessment & Plan:  Hypertensive emergency  Chronic diastolic HF - in the setting of cocaine abuse and out of 2/5 (lasix/ hydralazine) home BP meds  - chronic exertional dyspnea per patient - TTE from 11/2017 shows EF 55-60%, G2DD, PAP 42 mm Hg - CXR with small right pleural effusion; bibasilar atelectasis noted on CT; no overt edema  P:  Labetalol started 9/1 given aortic dissection. Clonidine weaned from 0.2 to 0.1 mg TID will plan to continue weaning Cardene gtt for SBP < 160 - now titrated off, will discontinue and add PRN labetalol for SBP>140 Lasix 120 mg BID    Descending thoracic aortic dissection s/p graft repair 2018 -with extension of her descending thoracic aortic dissection, not previously seen on July MRI - H/H stable  P:    TCTS to see in am  Will likely need vascular consult in am  Type and screen Trend H/H q 8 hours    AKI on CKD stage IV/V - no significant improvement in creatinine though making good volume of urine output and bicarb improving with bicarb tablets  P:  No acute indication for hemodialysis. Renal discussed with patient and she may need to initiate this admission especially if she undergoes a venogram. Will need placement of permacath unless she develops an acute indication Sodium bicarb 650 mg tablets BID  Lasix as above Trend BMP / mag/ phos/ daily wt/ urinary output   LUE swelling - final interpretation not available though per Vascular Surgery read has partial left subclavian vein obstruction and will need central venogram requiring contrast - s/p first surgery of AVF 8/9  P:  Will need venogram, possibly Tuesday 9/3 Vascular u/s to r/o DVT  Cocaine Abuse P:  Ongoing cesesation counseling   Best Practice / Goals of Care / Disposition.   DVT prophylaxis: SCDs. SQH  GI prophylaxis: n/a Diet: heat healthy Mobility: as tolerated Code Status: full  Family Communication: no family at bedside.  Patient updated on plan of care   Disposition / Summary of Today's Plan 01/15/18   Stable for transfer to [facility] 9/2   Micro Data: 8/31 MRSA PCR>> negative  Antimicrobials: none  Objective    Examination: General:  Pleasant, young AAF lying in bed in NAD HEENT: MM pink/moist Neuro:  Alert, oriented, MAE CV: sinus rhythm, +SEM at LSB PULM: even/non-labored,  lungs bilaterally clear anteriorly, GI: soft, non-tender,  Extremities: warm/dry, LUE with +4 swelling extending into left breast, bilateral LE edema improved  Skin: no rashes  Blood pressure (!) 148/80, pulse 90, temperature 97.9 F (36.6 C), temperature source Oral, resp. rate 20, height 5\' 3"  (1.6 m), weight 65.4 kg, last menstrual period 12/15/2017, SpO2 95 %.        Intake/Output Summary (Last 24  hours) at 01/15/2018 0904 Last data filed at 01/15/2018 0800 Gross per 24 hour  Intake 1138.83 ml  Output 2550 ml  Net -1411.17 ml   Filed Weights   01/14/18 0400 01/14/18 0700 01/15/18 0318  Weight: 60 kg 65.2 kg 65.4 kg    Labs    CBC: Recent Labs  Lab 01/13/18 2213 01/14/18 0643 01/14/18 0939 01/14/18 1446 01/14/18 2302 01/15/18 0619  WBC 4.0  --  5.1  --   --  5.5  HGB 11.1* 11.5* 11.1* 10.6* 11.3* 10.6*  HCT 36.0 37.1 34.9* 33.8* 35.7* 32.8*  MCV 93.3  --  91.6  --   --  89.6  PLT 165  --  180  --   --  048   Basic Metabolic Panel: Recent Labs  Lab 01/13/18 2213 01/14/18 0628 01/14/18 0939 01/15/18 0619  NA 137  --  136 136  K 4.4  --  3.9 3.6  CL 109  --  109 110  CO2 14*  --  15* 17*  GLUCOSE 119*  --  102* 93  BUN 54*  --  54* 54*  CREATININE 7.16*  --  7.40* 7.68*  CALCIUM 8.8*  --  8.5* 7.7*  MG  --  2.5*  --  2.2  PHOS  --  5.5*  --  4.5   GFR: Estimated Creatinine Clearance: 9.8 mL/min (A) (by C-G formula based on SCr of 7.68 mg/dL (H)). Recent Labs  Lab 01/13/18 2213 01/14/18 0939 01/15/18 0619  WBC 4.0 5.1 5.5   Liver Function Tests: Recent Labs  Lab 01/15/18 0619  ALBUMIN 3.0*   No results for input(s): LIPASE, AMYLASE in the last 168 hours. No results for input(s): AMMONIA in the last 168 hours. ABG    Component Value Date/Time   TCO2 20 (L) 12/17/2017 1139    Coagulation Profile: No results for input(s): INR, PROTIME in the last 168 hours. Cardiac Enzymes: No results for input(s): CKTOTAL, CKMB, CKMBINDEX, TROPONINI in the last 168 hours. HbA1C: Hgb A1c MFr Bld  Date/Time Value Ref Range Status  10/20/2017 02:28 AM 5.2 4.8 - 5.6 % Final    Comment:    (NOTE) Pre diabetes:          5.7%-6.4% Diabetes:              >6.4% Glycemic control for   <7.0% adults with diabetes    CBG: No results for input(s): GLUCAP in the last 168 hours.   Review of Systems:   As per HPI otherwise negative.    Past medical history  She,   has a past medical history of Aortic dissection (HCC), CHF (congestive heart failure) ([place]), CKD (chronic kidney disease) stage 4, GFR 15-29 ml/min ([place]), H/O: alcohol abuse, Hypertension, Renal disorder, and TTP (thrombotic thrombocytopenic purpura) ([place]).   Surgical History    Past Surgical History:  Procedure Laterality Date  . AV FISTULA PLACEMENT Left 12/23/2017   Procedure: ARTERIOVENOUS (AV) FISTULA CREATION  LEFT ARM;  Surgeon: Rosetta Posner, MD;  Location: [place];  Service: Vascular;  Laterality: Left;  .  ECTOPIC PREGNANCY SURGERY  2018  . REPAIR OF ACUTE ASCENDING THORACIC AORTIC DISSECTION       Social History   Social History   Socioeconomic History  . Marital status: Single    Spouse name: Not on file  . Number of children: Not on file  . Years of education: Not on file  . Highest education level: Not on file  Occupational History  . Not on file  Social Needs  . Financial resource strain: Not on file  . Food insecurity:    Worry: Not on file    Inability: Not on file  . Transportation needs:    Medical: Not on file    Non-medical: Not on file  Tobacco Use  . Smoking status: Current Every Day Smoker    Packs/day: 0.50    Years: 4.00    Pack years: 2.00    Types: Cigarettes  . Smokeless tobacco: Never Used  Substance and Sexual Activity  . Alcohol use: Yes    Comment: OCCASIONAL  . Drug use: Yes    Types: Cocaine, Benzodiazepines    Comment: Last use 08/2017  . Sexual activity: Yes  Lifestyle  . Physical activity:    Days per week: Not on file    Minutes per session: Not on file  . Stress: Not on file  Relationships  . Social connections:    Talks on phone: Not on file    Gets together: Not on file    Attends religious service: Not on file    Active member of club or organization: Not on file    Attends meetings of clubs or organizations: Not on file    Relationship status: Not on file  . Intimate partner violence:    Fear of current or ex partner:  Not on file    Emotionally abused: Not on file    Physically abused: Not on file    Forced sexual activity: Not on file  Other Topics Concern  . Not on file  Social History Narrative  . Not on file  ,  reports that she has been smoking cigarettes. She has a 2.00 pack-year smoking history. She has never used smokeless tobacco. She reports that she drinks alcohol. She reports that she has current or past drug history. Drugs: Cocaine and Benzodiazepines.   Family history   Her family history includes Hypertension in her mother.   Allergies No Known Allergies  Home meds  Prior to Admission medications   Medication Sig Start Date End Date Taking? Authorizing Provider  amLODipine (NORVASC) 10 MG tablet Take 1 tablet (10 mg total) by mouth daily. for high blood pressure 12/03/17  Yes Lavina Hamman, MD  calcitRIOL (ROCALTROL) 0.25 MCG capsule Take 1 capsule (0.25 mcg total) by mouth daily. 12/24/17  Yes Patrecia Pour, MD  cloNIDine (CATAPRES) 0.2 MG tablet Take 1 tablet (0.2 mg total) by mouth 3 (three) times daily. 12/24/17  Yes Patrecia Pour, MD  ferrous sulfate 325 (65 FE) MG tablet Take 1 tablet (325 mg total) by mouth 2 (two) times daily with a meal. 12/24/17  Yes Patrecia Pour, MD  furosemide (LASIX) 80 MG tablet Take 1 tablet (80 mg total) by mouth 2 (two) times daily. 12/24/17  Yes Patrecia Pour, MD  hydrALAZINE (APRESOLINE) 100 MG tablet Take 1 tablet (100 mg total) by mouth 3 (three) times daily. 12/24/17  Yes Patrecia Pour, MD  isosorbide mononitrate (IMDUR) 120 MG 24 hr tablet Take 1 tablet (  120 mg total) by mouth daily. 12/25/17  Yes Patrecia Pour, MD  sodium bicarbonate 650 MG tablet Take 1 tablet (650 mg total) by mouth 2 (two) times daily. 12/24/17  Yes Patrecia Pour, MD    Mali Amol Domanski, MD [facility] Pager [phone] After hours pager: [phone]

## 2018-01-15 NOTE — Progress Notes (Signed)
Patient ID: Valerie Mathis, female   DOB: [birth year], 32 y.o.   MRN: [id number] [facility] Progress Note   Assessment/ Plan:   1.  Chronic kidney disease stage V: With progressive chronic kidney disease secondary to hypertension and continued polysubstance abuse.    With mild hypervolemia but without acute indications for dialysis.  I had a lengthy discussion with her this morning regarding initiation of dialysis during this hospitalization especially if central venogram will be undertaken-she is agreeable to do this with the understanding that it would be chronic/3 days a week and via a tunneled hemodialysis catheter. 2.  Hypertensive urgency:  Improving blood pressures noted, agree with labetalol (possibly safer in the setting of cocaine abuse) and weaning down clonidine. 3.  Anion gap metabolic acidosis with non-gap component: Likely secondary to her progressive kidney disease and acute worsening of renal function-improving with sodium bicarbonate. 4.  Polysubstance abuse: Discussed the need for abstinence with the goal being able to successfully undergo renal transplantation at some point. 5.  Anemia: Hemoglobin/hematocrit within acceptable range-no indications at this time for ESA. 6.  Secondary hyperparathyroidism: Monitor phosphorus levels and continue calcitriol for PTH suppression.  Subjective:   Reports to be feeling fair, denies any nausea, vomiting or dysgeusia.  Denies any chest pain and flank pain appears to have resolved.   Objective:   BP (!) 148/80   Pulse 90   Temp 97.9 F (36.6 C) (Oral)   Resp 20   Ht 5\' 3"  (1.6 m)   Wt 65.4 kg   LMP 12/15/2017   SpO2 95%   BMI 25.54 kg/m   Intake/Output Summary (Last 24 hours) at 01/15/2018 8469 Last data filed at 01/15/2018 0800 Gross per 24 hour  Intake 1276.1 ml  Output 2550 ml  Net -1273.9 ml   Weight change: 5.409 kg  Physical Exam: Gen: Comfortably resting in bed, watching television and eating breakfast CVS:  Pulse regular rhythm, normal rate, S1 and S2 normal Resp: Clear to auscultation bilaterally, no rales or rhonchi Abd: Soft, obese, nontender Ext: 2+ lower extremity edema with some decreased left upper arm swelling.  Low pitched bruit audible.  Imaging: Ct Renal Stone Study  Result Date: 01/14/2018 CLINICAL DATA:  RIGHT back pain, hypertensive. History of end-stage renal disease, type-B aortic dissection, status post repair 2018, hypertension, substance abuse. EXAM: CT ABDOMEN AND PELVIS WITHOUT CONTRAST TECHNIQUE: Multidetector CT imaging of the abdomen and pelvis was performed following the standard protocol without IV contrast. COMPARISON:  MRA abdomen November 29, 2017 FINDINGS: LOWER CHEST: Moderate cardiomegaly. No pericardial effusion. Moderate RIGHT and small LEFT pleural effusions. Bilateral lower lobe atelectasis. HEPATOBILIARY: Hepatomegaly. Subcentimeter gallstones without CT findings of acute cholecystitis. PANCREAS: Normal. SPLEEN: Normal. ADRENALS/URINARY TRACT: Kidneys are orthotopic, mildly atrophic. No nephrolithiasis, hydronephrosis; limited assessment for renal masses by nonenhanced CT. The unopacified ureters are normal in course and caliber. Urinary bladder is decompressed and unremarkable. Normal adrenal glands. STOMACH/BOWEL: The stomach, small and large bowel are normal in course and caliber without inflammatory changes, sensitivity decreased by lack of enteric contrast. Normal appendix. VASCULAR/LYMPHATIC: Aortoiliac vessels are normal in course and caliber. Crescentic density descending thoracic aorta are consistent with dissection. REPRODUCTIVE: Normal. OTHER: Small to moderate volume ascites. No intraperitoneal free air MUSCULOSKELETAL: Anasarca. Status post median sternotomy. Paraumbilical scarring. Diffusely sclerotic axial skeleton most compatible with renal osteodystrophy. IMPRESSION: 1. Descending thoracic aortic dissection with apparent propagation from prior MRI given  differences in imaging technique. 2. Cardiomegaly. Moderate RIGHT and small LEFT pleural effusions  with bibasilar atelectasis. 3. Small to moderate volume ascites.  Anasarca. 4. Hepatomegaly. 5. Acute findings discussed with and reconfirmed by Dr.DOUGLAS DELO on 01/14/2018 at 4:38 am. Aortic Atherosclerosis (ICD10-I70.0). Electronically Signed   By: Elon Alas M.D.   On: 01/14/2018 04:38    Labs: BMET Recent Labs  Lab 01/13/18 2213 01/14/18 0628 01/14/18 0939 01/15/18 0619  NA 137  --  136 136  K 4.4  --  3.9 3.6  CL 109  --  109 110  CO2 14*  --  15* 17*  GLUCOSE 119*  --  102* 93  BUN 54*  --  54* 54*  CREATININE 7.16*  --  7.40* 7.68*  CALCIUM 8.8*  --  8.5* 7.7*  PHOS  --  5.5*  --  4.5   CBC Recent Labs  Lab 01/13/18 2213  01/14/18 0939 01/14/18 1446 01/14/18 2302 01/15/18 0619  WBC 4.0  --  5.1  --   --  5.5  HGB 11.1*   < > 11.1* 10.6* 11.3* 10.6*  HCT 36.0   < > 34.9* 33.8* 35.7* 32.8*  MCV 93.3  --  91.6  --   --  89.6  PLT 165  --  180  --   --  199   < > = values in this interval not displayed.    Medications:    . amLODipine  10 mg Oral Daily  . calcitRIOL  0.25 mcg Oral Daily  . cloNIDine  0.2 mg Oral TID  . hydrALAZINE  100 mg Oral Q8H  . sodium bicarbonate  1,300 mg Oral TID   Elmarie Shiley, MD 01/15/2018, 8:22 AM

## 2018-01-16 LAB — CBC
HEMATOCRIT: 33.8 % — AB (ref 36.0–46.0)
Hemoglobin: 10.8 g/dL — ABNORMAL LOW (ref 12.0–15.0)
MCH: 28.6 pg (ref 26.0–34.0)
MCHC: 32 g/dL (ref 30.0–36.0)
MCV: 89.7 fL (ref 78.0–100.0)
Platelets: 197 10*3/uL (ref 150–400)
RBC: 3.77 MIL/uL — ABNORMAL LOW (ref 3.87–5.11)
RDW: 16.4 % — AB (ref 11.5–15.5)
WBC: 4.8 10*3/uL (ref 4.0–10.5)

## 2018-01-16 LAB — RENAL FUNCTION PANEL
Albumin: 3 g/dL — ABNORMAL LOW (ref 3.5–5.0)
Anion gap: 10 (ref 5–15)
BUN: 50 mg/dL — AB (ref 6–20)
CO2: 20 mmol/L — ABNORMAL LOW (ref 22–32)
Calcium: 7.7 mg/dL — ABNORMAL LOW (ref 8.9–10.3)
Chloride: 108 mmol/L (ref 98–111)
Creatinine, Ser: 7.99 mg/dL — ABNORMAL HIGH (ref 0.44–1.00)
GFR calc Af Amer: 7 mL/min — ABNORMAL LOW (ref >60)
GFR, EST NON AFRICAN AMERICAN: 6 mL/min — AB (ref >60)
GLUCOSE: 99 mg/dL (ref 70–99)
POTASSIUM: 3.3 mmol/L — AB (ref 3.5–5.1)
Phosphorus: 4.9 mg/dL — ABNORMAL HIGH (ref 2.5–4.6)
Sodium: 138 mmol/L (ref 135–145)

## 2018-01-16 MED ORDER — ACETAMINOPHEN 325 MG PO TABS
650.0000 mg | ORAL_TABLET | Freq: Four times a day (QID) | ORAL | Status: DC | PRN
  Administered 2018-01-17 – 2018-01-21 (×5): 650 mg via ORAL
  Filled 2018-01-16 (×6): qty 2

## 2018-01-16 MED ORDER — POTASSIUM CHLORIDE 20 MEQ PO PACK
20.0000 meq | PACK | Freq: Once | ORAL | Status: DC
  Filled 2018-01-16: qty 1

## 2018-01-16 MED ORDER — CEFAZOLIN SODIUM-DEXTROSE 1-4 GM/50ML-% IV SOLN
1.0000 g | INTRAVENOUS | Status: AC
  Administered 2018-01-17: 1 g via INTRAVENOUS
  Filled 2018-01-16: qty 50

## 2018-01-16 MED ORDER — ISOSORBIDE MONONITRATE ER 60 MG PO TB24
60.0000 mg | ORAL_TABLET | Freq: Every day | ORAL | Status: DC
  Administered 2018-01-16: 60 mg via ORAL
  Filled 2018-01-16: qty 1

## 2018-01-16 MED ORDER — HYDRALAZINE HCL 100 MG PO TABS: 100.0000 mg | ORAL_TABLET | Freq: Three times a day (TID) | ORAL | Status: DC

## 2018-01-16 MED ORDER — CLONIDINE HCL 0.1 MG PO TABS
0.1000 mg | ORAL_TABLET | Freq: Two times a day (BID) | ORAL | Status: DC
  Administered 2018-01-16 – 2018-01-20 (×7): 0.1 mg via ORAL
  Filled 2018-01-16 (×8): qty 1

## 2018-01-16 MED ORDER — POTASSIUM CHLORIDE CRYS ER 20 MEQ PO TBCR
20.0000 meq | EXTENDED_RELEASE_TABLET | Freq: Once | ORAL | Status: AC
  Administered 2018-01-16: 20 meq via ORAL
  Filled 2018-01-16: qty 1

## 2018-01-16 MED ORDER — ISOSORBIDE MONONITRATE ER 60 MG PO TB24: 120.0000 mg | ORAL_TABLET | Freq: Every day | ORAL | Status: DC

## 2018-01-16 MED ORDER — FUROSEMIDE 10 MG/ML IJ SOLN
80.0000 mg | Freq: Two times a day (BID) | INTRAMUSCULAR | Status: DC
  Administered 2018-01-16 – 2018-01-19 (×6): 80 mg via INTRAVENOUS
  Filled 2018-01-16 (×8): qty 8

## 2018-01-16 NOTE — Progress Notes (Signed)
[facility] Progress Note   Assessment/ Plan:   1. Chronic kidney disease stage V: To start HD for TDC in AM. 2. Hypertensive urgency: Improving blood pressures noted, agree with labetalol  3. Anion gap metabolic acidosis with non-gap component due to CKD. 4. Polysubstance abuse:Needs treatment Subjective: Interval History: Some reduction in swelling of LUUE  Objective: Vital signs in last 24 hours: Temp:  [97.6 F (36.4 C)-98.6 F (37 C)] 98.6 F (37 C) (09/02 0736) Pulse Rate:  [68-84] 72 (09/02 1000) Resp:  [7-28] 14 (09/02 1000) BP: (122-185)/(65-102) 144/83 (09/02 1000) SpO2:  [93 %-99 %] 97 % (09/02 1000) Weight:  [62.5 kg] 62.5 kg (09/02 0511) Weight change: -2.909 kg  Intake/Output from previous day: 09/01 0701 - 09/02 0700 In: 890.2 [P.O.:840; I.V.:3.8; IV Piggyback:46.4] Out: 5050 [Urine:5050] Intake/Output this shift: Total I/O In: 475.7 [P.O.:360; I.V.:10.2; IV Piggyback:105.5] Out: 500 [Urine:500]  General appearance: alert and cooperative Back: symmetric, no curvature. ROM normal. No CVA tenderness., 1+ presacral edema Resp: clear to auscultation bilaterally Chest wall: no tenderness Extremities: edema 1+  BLE, LUE with swelling, mod  Lab Results: Recent Labs    01/15/18 0619  01/15/18 2250 01/16/18 0629  WBC 5.5  --   --  4.8  HGB 10.6*   < > 10.4* 10.8*  HCT 32.8*   < > 32.0* 33.8*  PLT 199  --   --  197   < > = values in this interval not displayed.   BMET:  Recent Labs    01/15/18 0619 01/16/18 0250  NA 136 138  K 3.6 3.3*  CL 110 108  CO2 17* 20*  GLUCOSE 93 99  BUN 54* 50*  CREATININE 7.68* 7.99*  CALCIUM 7.7* 7.7*   No results for input(s): PTH in the last 72 hours. Iron Studies: No results for input(s): IRON, TIBC, TRANSFERRIN, FERRITIN in the last 72 hours. Studies/Results: No results found.  Scheduled: . calcitRIOL  0.25 mcg Oral Daily  . cloNIDine  0.1 mg Oral BID  . heparin  5,000 Units  Subcutaneous Q8H  . hydrALAZINE  100 mg Oral Q8H  . isosorbide mononitrate  60 mg Oral Daily  . labetalol  100 mg Oral BID  . sodium bicarbonate  1,300 mg Oral TID   Continuous: . sodium chloride Stopped (01/16/18 0916)  . [START ON 01/17/2018]  ceFAZolin (ANCEF) IV    . furosemide 120 mg (01/16/18 0917)     LOS: 2 days   Estanislado Emms 01/16/2018,10:37 AM

## 2018-01-16 NOTE — Progress Notes (Signed)
[facility] TEAM 1 - Stepdown/ICU TEAM  Arrabella Westerman  KGM:[id number] DOB: [birth year] DOA: 01/13/2018 PCP: Patient, No Pcp Per    Brief Narrative:  32 year old female with a hx of type B aortic dissection s/p graft repair 2018 in [facility], diastolic HF, CKD stage IV/V, HTN, cocaine and tobacco abuse, TTP, and limited medical adherence who presented to the ER with complaints of sudden onset right flank pain. She was found to have progression of her thoracic aortic dissection, worsening AKI, and volume overload.    Significant Events: 8/31 admit 8/31 venous duplex LUE - subclavian partial thrombosis  9/2 [facility] assumed care   Subjective: Resting comfortably in bed.  Denies cp, sob, n/v, or abdom pain.    Assessment & Plan:  Hypertensive emergency  In setting of cocaine abuse and medication noncompliance - BP now better controlled, but not yet at goal - adjust tx - will need to find simple and affordable regimen to aid in outpt compliance - clonidine a very poor choice w/ intermittent compliance due to rebound HTN   Descending thoracic aortic dissection s/p graft repair 2018 ?extension of a prior descending thoracic aortic dissection suggested by limited CT view of aorta incidentally on rena stone CT - not seen on MRI July 2019 - TCTS and VVS have evaluated - pt has not sx to suggest an acute issue at this time - medical tx - strict BP control   Chronic diastolic HF TTE 10/4401 noted EF 55-60%, G2DD, PAP 42 mm Hg - no gross volume overload at this time   AKI on CKD stage V Nephrology following - to have tunneled HD cath placed 9/3 to start HD  Anemia of CKD Hgb stable at this time   LUE swelling - Central vein stenosis  partial left subclavian vein obstruction via duplex - S/p left first stage basilic 8/9 Dr Donnetta Hutching - for venogram 9/3 per VVS  Cocaine Abuse Appears to be the root of her medical issues - failure to abstain will lead to her death   DVT prophylaxis: SQ heparin          Code Status: FULL CODE Family Communication: no family present at time of exam  Disposition Plan: SDU - for OR 9/3  Consultants:  Nephrology  VVS TCTS PCCM  Antimicrobials:  none   Objective: Blood pressure (!) 162/87, pulse 78, temperature 98.6 F (37 C), temperature source Oral, resp. rate 16, height 5\' 3"  (1.6 m), weight 62.5 kg, last menstrual period 12/15/2017, SpO2 97 %.  Intake/Output Summary (Last 24 hours) at 01/16/2018 0933 Last data filed at 01/16/2018 0918 Gross per 24 hour  Intake 770.23 ml  Output 5300 ml  Net -4529.77 ml   Filed Weights   01/14/18 0700 01/15/18 0318 01/16/18 0511  Weight: 65.2 kg 65.4 kg 62.5 kg    Examination: General: No acute respiratory distress Lungs: Clear to auscultation bilaterally without wheezes or crackles Cardiovascular: Regular rate and rhythm without murmur gallop or rub normal S1 and S2 Abdomen: Nontender, nondistended, soft, bowel sounds positive, no rebound, no ascites, no appreciable mass Extremities: No significant cyanosis, clubbing, or edema bilateral lower extremities  CBC: Recent Labs  Lab 01/14/18 0939  01/15/18 0619 01/15/18 1353 01/15/18 2250 01/16/18 0629  WBC 5.1  --  5.5  --   --  4.8  HGB 11.1*   < > 10.6* 10.5* 10.4* 10.8*  HCT 34.9*   < > 32.8* 33.2* 32.0* 33.8*  MCV 91.6  --  89.6  --   --  89.7  PLT 180  --  199  --   --  197   < > = values in this interval not displayed.   Basic Metabolic Panel: Recent Labs  Lab 01/14/18 0628 01/14/18 0939 01/15/18 0619 01/16/18 0250  NA  --  136 136 138  K  --  3.9 3.6 3.3*  CL  --  109 110 108  CO2  --  15* 17* 20*  GLUCOSE  --  102* 93 99  BUN  --  54* 54* 50*  CREATININE  --  7.40* 7.68* 7.99*  CALCIUM  --  8.5* 7.7* 7.7*  MG 2.5*  --  2.2  --   PHOS 5.5*  --  4.5 4.9*   GFR: Estimated Creatinine Clearance: 8.6 mL/min (A) (by C-G formula based on SCr of 7.99 mg/dL (H)).  Liver Function Tests: Recent Labs  Lab 01/15/18 0321 01/16/18 0250   ALBUMIN 3.0* 3.0*    HbA1C: Hgb A1c MFr Bld  Date/Time Value Ref Range Status  10/20/2017 02:28 AM 5.2 4.8 - 5.6 % Final    Comment:    (NOTE) Pre diabetes:          5.7%-6.4% Diabetes:              >6.4% Glycemic control for   <7.0% adults with diabetes     Recent Results (from the past 240 hour(s))  MRSA PCR Screening     Status: None   Collection Time: 01/14/18  7:09 AM  Result Value Ref Range Status   MRSA by PCR NEGATIVE NEGATIVE Final    Comment:        The GeneXpert MRSA Assay (FDA approved for NASAL specimens only), is one component of a comprehensive MRSA colonization surveillance program. It is not intended to diagnose MRSA infection nor to guide or monitor treatment for MRSA infections. Performed at [facility], [place] [address], [place], [place] [address]      Scheduled Meds: . amLODipine  10 mg Oral Daily  . calcitRIOL  0.25 mcg Oral Daily  . cloNIDine  0.1 mg Oral TID  . heparin  5,000 Units Subcutaneous Q8H  . hydrALAZINE  100 mg Oral Q8H  . labetalol  100 mg Oral BID  . sodium bicarbonate  1,300 mg Oral TID   Continuous Infusions: . sodium chloride 20 mL/hr at 01/15/18 1800  . [START ON 01/17/2018]  ceFAZolin (ANCEF) IV    . furosemide 120 mg (01/16/18 0917)     LOS: 2 days   Cherene Altes, MD [facility] Office  [phone] Pager - Text Page per Amion  If 7PM-7AM, please contact night-coverage per Amion 01/16/2018, 9:33 AM

## 2018-01-16 NOTE — Progress Notes (Signed)
[facility] [place]  Subjective  - arm less swollen   Objective (!) 160/92 75 97.7 F (36.5 C) (Oral) 15 97%  Intake/Output Summary (Last 24 hours) at 01/16/2018 0738 Last data filed at 01/16/2018 0500 Gross per 24 hour  Intake 890.23 ml  Output 5050 ml  Net -4159.77 ml   Left arm incision less swollen + bruit in fistula   Assessment/Planning: Discussed with Dr Posey Pronto Pt to start HD Tunneled catheter and central venogram tomorrow  Ruta Hinds 01/16/2018 7:38 AM --  Laboratory Lab Results: Recent Labs    01/15/18 0619  01/15/18 2250 01/16/18 0629  WBC 5.5  --   --  4.8  HGB 10.6*   < > 10.4* 10.8*  HCT 32.8*   < > 32.0* 33.8*  PLT 199  --   --  197   < > = values in this interval not displayed.   BMET Recent Labs    01/15/18 0619 01/16/18 0250  NA 136 138  K 3.6 3.3*  CL 110 108  CO2 17* 20*  GLUCOSE 93 99  BUN 54* 50*  CREATININE 7.68* 7.99*  CALCIUM 7.7* 7.7*    COAG Lab Results  Component Value Date   INR 1.19 11/29/2017   No results found for: PTT

## 2018-01-16 NOTE — Plan of Care (Signed)
Pt is currently in bed, appears asleep. Vital signs stable. Transfer order in place, awaiting bed availability. Pt on tele box, ambulated to and from the bathroom. No complaints of pain during shift. States that swelling in arm is better. Consents for HD tunneled cath and central venogram signed and in chart. No new changes at this time. Will continue to monitor.  Problem: Education: Goal: Knowledge of General Education information will improve Description Including pain rating scale, medication(s)/side effects and non-pharmacologic comfort measures Outcome: Progressing   Problem: Health Behavior/Discharge Planning: Goal: Ability to manage health-related needs will improve Outcome: Progressing   Problem: Clinical Measurements: Goal: Ability to maintain clinical measurements within normal limits will improve Outcome: Progressing Goal: Will remain free from infection Outcome: Progressing Goal: Diagnostic test results will improve Outcome: Progressing Goal: Cardiovascular complication will be avoided Outcome: Progressing   Problem: Activity: Goal: Risk for activity intolerance will decrease Outcome: Progressing   Problem: Elimination: Goal: Will not experience complications related to bowel motility Outcome: Progressing Goal: Will not experience complications related to urinary retention Outcome: Progressing   Problem: Pain Managment: Goal: General experience of comfort will improve Outcome: Progressing   Problem: Safety: Goal: Ability to remain free from injury will improve Outcome: Progressing   Problem: Skin Integrity: Goal: Risk for impaired skin integrity will decrease Outcome: Progressing   Problem: Fluid Volume: Goal: Compliance with measures to maintain balanced fluid volume will improve Outcome: Progressing

## 2018-01-16 NOTE — H&P (View-Only) (Signed)
[facility] [place]  Subjective  - arm less swollen   Objective (!) 160/92 75 97.7 F (36.5 C) (Oral) 15 97%  Intake/Output Summary (Last 24 hours) at 01/16/2018 0738 Last data filed at 01/16/2018 0500 Gross per 24 hour  Intake 890.23 ml  Output 5050 ml  Net -4159.77 ml   Left arm incision less swollen + bruit in fistula   Assessment/Planning: Discussed with Dr Posey Pronto Pt to start HD Tunneled catheter and central venogram tomorrow  Ruta Hinds 01/16/2018 7:38 AM --  Laboratory Lab Results: Recent Labs    01/15/18 0619  01/15/18 2250 01/16/18 0629  WBC 5.5  --   --  4.8  HGB 10.6*   < > 10.4* 10.8*  HCT 32.8*   < > 32.0* 33.8*  PLT 199  --   --  197   < > = values in this interval not displayed.   BMET Recent Labs    01/15/18 0619 01/16/18 0250  NA 136 138  K 3.6 3.3*  CL 110 108  CO2 17* 20*  GLUCOSE 93 99  BUN 54* 50*  CREATININE 7.68* 7.99*  CALCIUM 7.7* 7.7*    COAG Lab Results  Component Value Date   INR 1.19 11/29/2017   No results found for: PTT

## 2018-01-16 NOTE — Plan of Care (Signed)
Patient alert and oriented.  Mood much better this afternoon than previous night.  Denies pain or discomfort at this time.  Monitoring.

## 2018-01-17 ENCOUNTER — Encounter (HOSPITAL_COMMUNITY)
Admission: EM | Disposition: A | Discharge status: Home / Self Care | Payer: Self-pay | Source: Home / Self Care | Attending: Internal Medicine

## 2018-01-17 ENCOUNTER — Encounter (HOSPITAL_COMMUNITY): Payer: Self-pay | Admitting: Surgery

## 2018-01-17 DIAGNOSIS — T82898A Other specified complication of vascular prosthetic devices, implants and grafts, initial encounter: Secondary | ICD-10-CM

## 2018-01-17 HISTORY — PX: INSERTION OF DIALYSIS CATHETER: SHX1324

## 2018-01-17 HISTORY — PX: A/V FISTULAGRAM: CATH118298

## 2018-01-17 LAB — CBC
HCT: 35.2 % — ABNORMAL LOW (ref 36.0–46.0)
HEMOGLOBIN: 11.3 g/dL — AB (ref 12.0–15.0)
MCH: 28.9 pg (ref 26.0–34.0)
MCHC: 32.1 g/dL (ref 30.0–36.0)
MCV: 90 fL (ref 78.0–100.0)
PLATELETS: 211 10*3/uL (ref 150–400)
RBC: 3.91 MIL/uL (ref 3.87–5.11)
RDW: 16.3 % — ABNORMAL HIGH (ref 11.5–15.5)
WBC: 4.5 10*3/uL (ref 4.0–10.5)

## 2018-01-17 LAB — RENAL FUNCTION PANEL
ANION GAP: 13 (ref 5–15)
Albumin: 3.5 g/dL (ref 3.5–5.0)
BUN: 50 mg/dL — ABNORMAL HIGH (ref 6–20)
CALCIUM: 8.2 mg/dL — AB (ref 8.9–10.3)
CHLORIDE: 106 mmol/L (ref 98–111)
CO2: 20 mmol/L — AB (ref 22–32)
Creatinine, Ser: 7.87 mg/dL — ABNORMAL HIGH (ref 0.44–1.00)
GFR, EST AFRICAN AMERICAN: 7 mL/min — AB (ref >60)
GFR, EST NON AFRICAN AMERICAN: 6 mL/min — AB (ref >60)
Glucose, Bld: 97 mg/dL (ref 70–99)
Phosphorus: 5.1 mg/dL — ABNORMAL HIGH (ref 2.5–4.6)
Potassium: 3.9 mmol/L (ref 3.5–5.1)
SODIUM: 139 mmol/L (ref 135–145)

## 2018-01-17 SURGERY — INSERTION OF DIALYSIS CATHETER
Laterality: Right

## 2018-01-17 MED ORDER — CHLORHEXIDINE GLUCONATE CLOTH 2 % EX PADS
6.0000 | MEDICATED_PAD | Freq: Every day | CUTANEOUS | Status: DC
  Administered 2018-01-17 – 2018-01-19 (×3): 6 via TOPICAL

## 2018-01-17 MED ORDER — LIDOCAINE HCL (PF) 1 % IJ SOLN
INTRAMUSCULAR | Status: DC | PRN
  Administered 2018-01-17: 30 mL

## 2018-01-17 MED ORDER — HEPARIN (PORCINE) IN NACL 1000-0.9 UT/500ML-% IV SOLN
INTRAVENOUS | Status: DC | PRN
  Administered 2018-01-17: 500 mL

## 2018-01-17 MED ORDER — ISOSORBIDE DINITRATE 30 MG PO TABS
30.0000 mg | ORAL_TABLET | Freq: Three times a day (TID) | ORAL | Status: DC
  Administered 2018-01-17 – 2018-01-23 (×17): 30 mg via ORAL
  Filled 2018-01-17 (×9): qty 1
  Filled 2018-01-17: qty 3
  Filled 2018-01-17 (×8): qty 1
  Filled 2018-01-17: qty 3
  Filled 2018-01-17: qty 1
  Filled 2018-01-17: qty 3

## 2018-01-17 MED ORDER — LABETALOL HCL 200 MG PO TABS
200.0000 mg | ORAL_TABLET | Freq: Two times a day (BID) | ORAL | Status: DC
  Administered 2018-01-17 – 2018-01-23 (×12): 200 mg via ORAL
  Filled 2018-01-17 (×14): qty 1

## 2018-01-17 MED ORDER — HEPARIN (PORCINE) IN NACL 1000-0.9 UT/500ML-% IV SOLN
INTRAVENOUS | Status: AC
  Filled 2018-01-17: qty 500

## 2018-01-17 MED ORDER — PENTAFLUOROPROP-TETRAFLUOROETH EX AERO: 1.0000 "application " | INHALATION_SPRAY | CUTANEOUS | Status: DC | PRN

## 2018-01-17 MED ORDER — SODIUM CHLORIDE 0.9 % IV SOLN: 100.0000 mL | INTRAVENOUS | Status: DC | PRN

## 2018-01-17 MED ORDER — LIDOCAINE HCL (PF) 1 % IJ SOLN: 5.0000 mL | INTRAMUSCULAR | Status: DC | PRN

## 2018-01-17 MED ORDER — ALTEPLASE 2 MG IJ SOLR: 2.0000 mg | Freq: Once | INTRAMUSCULAR | Status: DC | PRN

## 2018-01-17 MED ORDER — FENTANYL CITRATE (PF) 100 MCG/2ML IJ SOLN
INTRAMUSCULAR | Status: AC
  Filled 2018-01-17: qty 2

## 2018-01-17 MED ORDER — AMLODIPINE BESYLATE 10 MG PO TABS
10.0000 mg | ORAL_TABLET | Freq: Every day | ORAL | Status: DC
  Administered 2018-01-17 – 2018-01-23 (×7): 10 mg via ORAL
  Filled 2018-01-17 (×8): qty 1

## 2018-01-17 MED ORDER — LIDOCAINE HCL (PF) 1 % IJ SOLN
INTRAMUSCULAR | Status: AC
  Filled 2018-01-17: qty 30

## 2018-01-17 MED ORDER — HEPARIN SODIUM (PORCINE) 1000 UNIT/ML DIALYSIS
20.0000 [IU]/kg | INTRAMUSCULAR | Status: DC | PRN
  Filled 2018-01-17: qty 2

## 2018-01-17 MED ORDER — HEPARIN SODIUM (PORCINE) 1000 UNIT/ML DIALYSIS
1000.0000 [IU] | INTRAMUSCULAR | Status: DC | PRN
  Filled 2018-01-17: qty 1

## 2018-01-17 MED ORDER — MIDAZOLAM HCL 2 MG/2ML IJ SOLN
INTRAMUSCULAR | Status: AC
  Filled 2018-01-17: qty 2

## 2018-01-17 MED ORDER — FENTANYL CITRATE (PF) 100 MCG/2ML IJ SOLN
INTRAMUSCULAR | Status: DC | PRN
  Administered 2018-01-17 (×4): 25 ug via INTRAVENOUS

## 2018-01-17 MED ORDER — LIDOCAINE-PRILOCAINE 2.5-2.5 % EX CREA
1.0000 "application " | TOPICAL_CREAM | CUTANEOUS | Status: DC | PRN
  Filled 2018-01-17: qty 5

## 2018-01-17 MED ORDER — IODIXANOL 320 MG/ML IV SOLN
INTRAVENOUS | Status: DC | PRN
  Administered 2018-01-17: 15 mL via INTRAVENOUS

## 2018-01-17 MED ORDER — MIDAZOLAM HCL 2 MG/2ML IJ SOLN
INTRAMUSCULAR | Status: DC | PRN
  Administered 2018-01-17 (×4): 1 mg via INTRAVENOUS

## 2018-01-17 SURGICAL SUPPLY — 9 items
BAG SNAP BAND KOVER 36X36 (MISCELLANEOUS) ×10 IMPLANT
CATH PALINDROME RT-P 15FX23CM (CATHETERS) ×5 IMPLANT
COVER DOME SNAP 22 D (MISCELLANEOUS) ×10 IMPLANT
KIT MICROPUNCTURE NIT STIFF (SHEATH) ×5 IMPLANT
KIT PV (KITS) ×5 IMPLANT
PROTECTION STATION PRESSURIZED (MISCELLANEOUS) ×5
SHEATH PROBE COVER 6X72 (BAG) ×15 IMPLANT
STATION PROTECTION PRESSURIZED (MISCELLANEOUS) ×3 IMPLANT
TUBING CIL FLEX 10 FLL-RA (TUBING) ×5 IMPLANT

## 2018-01-17 NOTE — Interval H&P Note (Signed)
History and Physical Interval Note:  01/17/2018 8:54 AM  Valerie Mathis  has presented today for surgery, with the diagnosis of ESRD  The various methods of treatment have been discussed with the patient and family. After consideration of risks, benefits and other options for treatment, the patient has consented to  Procedure(s): SVC VENOGRAPHY (N/A) as a surgical intervention .  The patient's history has been reviewed, patient examined, no change in status, stable for surgery.  I have reviewed the patient's chart and labs.  Questions were answered to the patient's satisfaction.     Annamarie Major

## 2018-01-17 NOTE — Op Note (Addendum)
    Patient name: Kilyn Maragh MRN: [id number] DOB: 1986-05-08 Sex: female  01/13/2018 - 01/17/2018 Pre-operative Diagnosis: ESRD Post-operative diagnosis:  Same Surgeon:  Annamarie Major Procedure Performed:  1.  U/s guided placement of 23 cm right IJ Pallindrome  2.  U/s guided access, left basilic vein  3.  Fistulogram  4.  Conscious sedation (68 minutes)   Indications: Patient dialysis.  Seen to catheter.  She had a first stage basilic vein fistula creation and now has a swollen arm.  She is here for further evaluation.  Procedure:  The patient was identified in the holding area and taken to room 8.  The patient was then placed supine on the table and prepped and draped in the usual sterile fashion.  A time out was called.  Conscious sedation was administered with the use of IV fentanyl and Versed under continuous physician and nurse monitoring.  Heart rate, blood pressure, and oxygen saturation were continuously monitored.  Ultrasound was used to evaluate the right internal jugular vein which is widely patent and easily compressible.  1% lidocaine was used for local anesthesia.  The right internal jugular vein was then cannulated under ultrasound guidance with a micropuncture needle.  LAA wire was advanced without resistance and the micropuncture sheath was placed.  A 035 wire was then inserted into the inferior vena cava.  Micropuncture sheath was removed and sequential dilators were used to dilate the subcutaneous tract.  A peel-away sheath was then placed.  A 23 cm palindrome catheter was then placed over the wire and the peel-away sheath was removed.  The skin exit site was selected.  A subcutaneous tunnel was created and the catheter was brought through the tunnel with the cuff situated at the skin exit site.  The catheter was then connected to the ports.  Both of which flushed and aspirated without difficulty.  Fluoroscopy was again used to confirm that there were no kinks within the catheter  and the catheter tip was at the cavoatrial junction.  It was sutured into position with 3-0 nylon in the neck incision was closed with a 4-0 Monocryl.  Attention was then turned towards the left arm.  I evaluate the basilic vein with ultrasound which was about a 5 mm vein.  It was then cannulated under ultrasound guidance with a micropuncture needle.  An 018 wire was advanced without resistance.  I then inserted the dilator from the micropuncture sheath and contrast injections were performed through the micropuncture sheath which showed occlusion of the subclavian vein.  The dilator was then removed and manual pressure was held.  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 Annamarie Major, M.D. [facility] [place] Office: [phone] Pager:  [phone]

## 2018-01-17 NOTE — Progress Notes (Signed)
Subjective: Interval History: Patient doing well. She is s/p tunneled HD catheter placement and evaluation of her LUE access. She denies SOB or other uremic symptoms.   Objective: Vital signs in last 24 hours: Temp:  [97.6 F (36.4 C)-98.1 F (36.7 C)] 98.1 F (36.7 C) (09/03 0746) Pulse Rate:  [70-86] 78 (09/03 0830) Resp:  [14-20] 18 (09/02 1604) BP: (122-178)/(68-104) 163/92 (09/03 0830) SpO2:  [95 %-98 %] 97 % (09/03 0830) Weight:  [59.2 kg] 59.2 kg (09/03 0500) Weight change: -3.3 kg  Intake/Output from previous day: 09/02 0701 - 09/03 0700 In: 874.7 [P.O.:720; I.V.:49.3; IV Piggyback:105.5] Out: 4250 [Urine:4250]   Intake/Output this shift: Total I/O In: -  Out: 900 [Urine:900]  General: Well nourished female in no acute distress Pulm: Good air movement with no wheezing or crackles  CV: RRR, no murmurs, no rubs  Abdomen: Active bowel sounds, soft, non-distended, no tenderness to palpation  Neuro: Alert and oriented x 3  Lab Results: Recent Labs    01/16/18 0629 01/17/18 0232  WBC 4.8 4.5  HGB 10.8* 11.3*  HCT 33.8* 35.2*  PLT 197 211   BMET:  Recent Labs    01/16/18 0250 01/17/18 0232  NA 138 139  K 3.3* 3.9  CL 108 106  CO2 20* 20*  GLUCOSE 99 97  BUN 50* 50*  CREATININE 7.99* 7.87*  CALCIUM 7.7* 8.2*   No results for input(s): PTH in the last 72 hours.   Iron Studies: No results for input(s): IRON, TIBC, TRANSFERRIN, FERRITIN in the last 72 hours.   Studies/Results: No results found.  Scheduled: . calcitRIOL  0.25 mcg Oral Daily  . Chlorhexidine Gluconate Cloth  6 each Topical Q0600  . cloNIDine  0.1 mg Oral BID  . furosemide  80 mg Intravenous BID  . heparin  5,000 Units Subcutaneous Q8H  . hydrALAZINE  100 mg Oral Q8H  . isosorbide mononitrate  60 mg Oral Daily  . labetalol  200 mg Oral BID  . sodium bicarbonate  1,300 mg Oral TID   Assessment/Plan:  1. ESRD. Patient with baseline CKD Stage V presented with hypertensive urgency  and progression of her CKD. She is s/p tunnel catheter placement today and her LUE access is occluded. She is continuing to have good urine output and may not need long term HD. We will plan for HD today.   2. Hypertensive urgency. BP improved on Labetalol.   3. Anion gap metabolic acidosis with non-gap component due to CKD.  4. Polysubstance abuse. + Cocaine use. Discussed with patient. Amendable to treatment programs.    LOS: 3 days   [facility] 01/17/2018,9:06 AM

## 2018-01-17 NOTE — Plan of Care (Signed)
  Problem: Fluid Volume: Goal: Compliance with measures to maintain balanced fluid volume will improve Outcome: Progressing   

## 2018-01-17 NOTE — Progress Notes (Signed)
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 She was found to have possible progression of her aortic dissection, worsening AKI, and volume overload.    Significant Events: 8/31 admit 8/31 venous duplex LUE - subclavian partial thrombosis  9/2 TRH assumed care  9/3 R IJ palindrome cath place by VVS - venogram noting L subclavian vein occlusion    Subjective: Expected chest wall pain s/p cath insertion.  Denies substernal chest pressure, abdom pain, n/v, or sob.    Assessment & Plan:  Hypertensive emergency  In setting of cocaine abuse and medication noncompliance - BP not yet at goal - adjust tx - will need to find simple and affordable regimen to aid in outpt compliance - clonidine a very poor choice w/ intermittent compliance due to rebound HTN   Descending thoracic aortic dissection s/p graft repair 2018 ?extension of a prior descending thoracic aortic dissection suggested by limited CT view of aorta incidentally on rena stone CT - not seen on MRI July 2019 - TCTS and VVS have evaluated - pt has no sx to suggest an acute issue at this time - medical tx - strict BP control   Chronic diastolic HF TTE 12/2421 noted EF 55-60%, G2DD, PAP 42 mm Hg - no gross volume overload at this time   AKI on CKD stage V Nephrology following - tunneled HD cath placed 9/3 to start HD at discretion of Nephrology   Anemia of CKD Hgb stable at this time   LUE swelling - 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 rate 15, height 5\' 3"  (1.6 m), weight 59 kg, last menstrual period 12/15/2017, SpO2 100 %.  Intake/Output Summary (Last 24 hours) at 01/17/2018 1716 Last data filed at 01/17/2018 1640 Gross per 24 hour  Intake 1.49 ml  Output 5300 ml  Net -5298.51 ml   Filed Weights   01/17/18 0500 01/17/18 1330 01/17/18 1640  Weight: 59.2 kg 60.3 kg 59 kg    Examination: General: No acute respiratory distress at rest in bed  Lungs: Clear to auscultation bilaterally  Cardiovascular: RRR w/o M or rub  Abdomen: NT/ND, soft, bs+, no mass Extremities: trace B LE edema   CBC: Recent Labs  Lab 01/15/18 0619  01/15/18 2250 01/16/18 0629 01/17/18 0232  WBC 5.5  --   --  4.8 4.5  HGB 10.6*   < > 10.4* 10.8* 11.3*  HCT 32.8*   < > 32.0* 33.8* 35.2*  MCV 89.6  --   --  89.7 90.0  PLT 199  --   --  197  211   < > = values in this interval not displayed.   Basic Metabolic Panel: Recent Labs  Lab 01/14/18 0628  01/15/18 0619 01/16/18 0250 01/17/18 0232  NA  --    < > 136 138 139  K  --    < > 3.6 3.3* 3.9  CL  --    < > 110 108 106  CO2  --    < > 17* 20* 20*  GLUCOSE  --    < > 93 99 97  BUN  --    < > 54* 50* 50*  CREATININE  --    < > 7.68* 7.99* 7.87*  CALCIUM  --    < > 7.7* 7.7* 8.2*  MG 2.5*  --  2.2  --   --   PHOS 5.5*  --  4.5 4.9* 5.1*   < > = values in this interval not displayed.   GFR: Estimated Creatinine Clearance: 8.7 mL/min (A) (by C-G formula based on SCr of 7.87 mg/dL (H)).  Liver Function Tests: Recent Labs  Lab 01/15/18 3845 01/16/18 0250 01/17/18 0232  ALBUMIN 3.0* 3.0* 3.5     HbA1C: Hgb A1c MFr Bld  Date/Time Value Ref Range Status  10/20/2017 02:28 AM 5.2 4.8 - 5.6 % Final    Comment:    (NOTE) Pre diabetes:          5.7%-6.4% Diabetes:              >6.4% Glycemic control for   <7.0% adults with diabetes     Recent Results (from the past 240 hour(s))  MRSA PCR Screening     Status: None   Collection Time: 01/14/18  7:09 AM  Result Value Ref Range Status   MRSA by PCR NEGATIVE NEGATIVE Final    Comment:        The GeneXpert MRSA Assay (FDA approved for NASAL specimens only), is one component of a comprehensive MRSA colonization surveillance program. It is not intended to diagnose MRSA infection nor to guide or monitor treatment for MRSA infections. Performed at [facility], [place] [address] [place], [place] [address]      Scheduled Meds: . calcitRIOL  0.25 mcg Oral Daily  . Chlorhexidine Gluconate Cloth  6 each Topical Q0600  . cloNIDine  0.1 mg Oral BID  . furosemide  80 mg Intravenous BID  . heparin  5,000 Units Subcutaneous Q8H  . hydrALAZINE  100 mg Oral Q8H  . isosorbide dinitrate  30 mg Oral TID  . labetalol  200 mg Oral BID  . sodium bicarbonate  1,300 mg Oral TID   Continuous Infusions: . sodium chloride 10 mL/hr at 01/17/18 0900  . sodium chloride    . sodium chloride       LOS: 3 days   Cherene Altes, MD [facility] Office  [phone] Pager - Text Page per Amion  If 7PM-7AM, please contact night-coverage per Amion 01/17/2018, 5:16 PM

## 2018-01-18 ENCOUNTER — Encounter (HOSPITAL_COMMUNITY): Payer: Self-pay

## 2018-01-18 LAB — RENAL FUNCTION PANEL
Albumin: 2.9 g/dL — ABNORMAL LOW (ref 3.5–5.0)
Anion gap: 9 (ref 5–15)
BUN: 24 mg/dL — AB (ref 6–20)
CHLORIDE: 104 mmol/L (ref 98–111)
CO2: 26 mmol/L (ref 22–32)
Calcium: 7.8 mg/dL — ABNORMAL LOW (ref 8.9–10.3)
Creatinine, Ser: 5.17 mg/dL — ABNORMAL HIGH (ref 0.44–1.00)
GFR calc non Af Amer: 10 mL/min — ABNORMAL LOW (ref >60)
GFR, EST AFRICAN AMERICAN: 12 mL/min — AB (ref >60)
GLUCOSE: 101 mg/dL — AB (ref 70–99)
PHOSPHORUS: 4.2 mg/dL (ref 2.5–4.6)
Potassium: 3.4 mmol/L — ABNORMAL LOW (ref 3.5–5.1)
SODIUM: 139 mmol/L (ref 135–145)

## 2018-01-18 LAB — CBC
HEMATOCRIT: 31.8 % — AB (ref 36.0–46.0)
HEMOGLOBIN: 10.1 g/dL — AB (ref 12.0–15.0)
MCH: 28.5 pg (ref 26.0–34.0)
MCHC: 31.8 g/dL (ref 30.0–36.0)
MCV: 89.6 fL (ref 78.0–100.0)
Platelets: 157 10*3/uL (ref 150–400)
RBC: 3.55 MIL/uL — AB (ref 3.87–5.11)
RDW: 15.9 % — ABNORMAL HIGH (ref 11.5–15.5)
WBC: 4.2 10*3/uL (ref 4.0–10.5)

## 2018-01-18 LAB — HEPATITIS B CORE ANTIBODY, TOTAL: HEP B C TOTAL AB: NEGATIVE

## 2018-01-18 LAB — HEPATITIS B SURFACE ANTIBODY,QUALITATIVE: HEP B S AB: REACTIVE

## 2018-01-18 LAB — CORTISOL: Cortisol, Plasma: 6.6 ug/dL

## 2018-01-18 LAB — HEPATITIS B SURFACE ANTIGEN: Hepatitis B Surface Ag: NEGATIVE

## 2018-01-18 MED ORDER — CEFAZOLIN SODIUM-DEXTROSE 2-4 GM/100ML-% IV SOLN
2.0000 g | INTRAVENOUS | Status: AC
  Administered 2018-01-19: 2 g via INTRAVENOUS
  Filled 2018-01-18: qty 100

## 2018-01-18 MED ORDER — CHLORHEXIDINE GLUCONATE CLOTH 2 % EX PADS: 6.0000 | MEDICATED_PAD | Freq: Every day | CUTANEOUS | Status: DC

## 2018-01-18 MED ORDER — SODIUM CHLORIDE 0.9 % IV SOLN: 100.0000 mL | INTRAVENOUS | Status: DC | PRN

## 2018-01-18 MED ORDER — MORPHINE SULFATE (PF) 2 MG/ML IV SOLN
2.0000 mg | INTRAVENOUS | Status: AC | PRN
  Administered 2018-01-18 (×2): 4 mg via INTRAVENOUS
  Filled 2018-01-18 (×2): qty 2

## 2018-01-18 MED ORDER — HEPARIN SODIUM (PORCINE) 1000 UNIT/ML DIALYSIS
1000.0000 [IU] | INTRAMUSCULAR | Status: DC | PRN
  Filled 2018-01-18: qty 1

## 2018-01-18 NOTE — Progress Notes (Signed)
Plan for ligation of fistula tomorrow due to subclavian vein occlusion.  NPO after midnight  Valerie Mathis

## 2018-01-18 NOTE — Progress Notes (Signed)
Pt second HD treatment completed, well tolerated with no adverse events.

## 2018-01-18 NOTE — Progress Notes (Signed)
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 She was found to have possible progression of her aortic dissection, worsening AKI, and volume overload.    Significant Events: 8/31 admit 8/31 venous duplex LUE - subclavian partial thrombosis  9/2 TRH assumed care  9/3 R IJ palindrome cath place by VVS - venogram noting L subclavian vein occlusion    Subjective: Patient in bed, appears comfortable, denies any headache, no fever, no chest pain or pressure, no shortness of breath , no abdominal pain. No focal weakness.'   Assessment & Plan:  Hypertensive emergency  In setting of cocaine abuse and medication noncompliance - BP not yet at goal - adjust tx - will need to find simple and affordable regimen to aid in outpt compliance - clonidine a very poor choice w/ intermittent compliance due to rebound HTN   Descending thoracic aortic dissection s/p graft repair 2018 ?extension of a prior descending thoracic aortic dissection suggested by limited CT view of aorta incidentally on rena stone CT - not seen on MRI July 2019 - TCTS and VVS have evaluated - pt has no sx to suggest an acute issue at this time - medical tx - strict BP control   Chronic diastolic HF TTE 01/5637 noted EF 55-60%, G2DD, PAP 42 mm Hg - no gross volume overload at this time   AKI on CKD stage V Nephrology following - tunneled HD cath placed 9/3 and dialysis was initiated on 01/17/2018 as well, currently on daily dialysis for fluid removal.  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 rate 15, height 5\' 3"  (1.6 m), weight 55 kg, last menstrual period 12/15/2017, SpO2 96 %.  Intake/Output Summary (Last 24 hours) at 01/18/2018 1400 Last data filed at 01/18/2018 0900 Gross per 24 hour  Intake 250 ml  Output 3720 ml  Net -3470 ml   Filed Weights   01/17/18 1330 01/17/18 1640 01/18/18 0500  Weight: 60.3 kg 59 kg 55 kg    Examination:  Awake Alert, Oriented X 3, No new F.N deficits, Normal affect [place].AT,PERRAL Supple Neck,No JVD, No cervical lymphadenopathy appriciated.  Symmetrical Chest wall movement, Good air movement bilaterally, CTAB RRR,No Gallops, Rubs or new Murmurs, No Parasternal Heave +ve B.Sounds, Abd Soft, No tenderness, No organomegaly appriciated, No rebound - guarding or rigidity. No Cyanosis, Clubbing, 2+ edema, No new Rash or bruise  CBC: Recent Labs  Lab 01/16/18 0629 01/17/18 0232 01/18/18 0401  WBC 4.8 4.5 4.2  HGB 10.8* 11.3* 10.1*  HCT  33.8* 35.2* 31.8*  MCV 89.7 90.0 89.6  PLT 197 211 811   Basic Metabolic Panel: Recent Labs  Lab 01/14/18 0628  01/15/18 0619 01/16/18 0250 01/17/18 0232 01/18/18 0401  NA  --    < > 136 138 139 139  K  --    < > 3.6 3.3* 3.9 3.4*  CL  --    < > 110 108 106 104  CO2  --    < > 17* 20* 20* 26  GLUCOSE  --    < > 93 99 97 101*  BUN  --    < > 54* 50* 50* 24*  CREATININE  --    < > 7.68* 7.99* 7.87* 5.17*  CALCIUM  --    < > 7.7* 7.7* 8.2* 7.8*  MG 2.5*  --  2.2  --   --   --   PHOS 5.5*  --  4.5 4.9* 5.1* 4.2   < >  = values in this interval not displayed.   GFR: Estimated Creatinine Clearance: 13.3 mL/min (A) (by C-G formula based on SCr of 5.17 mg/dL (H)).  Liver Function Tests: Recent Labs  Lab 01/15/18 0315 01/16/18 0250 01/17/18 0232 01/18/18 0401  ALBUMIN 3.0* 3.0* 3.5 2.9*    HbA1C: Hgb A1c MFr Bld  Date/Time Value Ref Range Status  10/20/2017 02:28 AM 5.2 4.8 - 5.6 % Final    Comment:    (NOTE) Pre diabetes:          5.7%-6.4% Diabetes:              >6.4% Glycemic control for   <7.0% adults with diabetes     Recent Results (from the past 240 hour(s))  MRSA PCR Screening     Status: None   Collection Time: 01/14/18  7:09 AM  Result Value Ref Range Status   MRSA by PCR NEGATIVE NEGATIVE Final    Comment:        The GeneXpert MRSA Assay (FDA approved for NASAL specimens only), is one component of a comprehensive MRSA colonization surveillance program. It is not intended to diagnose MRSA infection nor to guide or monitor treatment for MRSA infections. Performed at [facility], [place] [address], [place], [place] [address]      Scheduled Meds: . amLODipine  10 mg Oral Daily  . calcitRIOL  0.25 mcg Oral Daily  . Chlorhexidine Gluconate Cloth  6 each Topical Q0600  . Chlorhexidine Gluconate Cloth  6 each Topical Q0600  . cloNIDine  0.1 mg Oral BID  . furosemide  80 mg Intravenous BID  . heparin  5,000 Units Subcutaneous Q8H  . hydrALAZINE  100 mg Oral Q8H  . isosorbide dinitrate  30 mg Oral TID  . labetalol  200 mg Oral BID  . sodium bicarbonate  1,300 mg Oral TID   Continuous Infusions: . sodium chloride 10 mL/hr at 01/17/18 0900  . sodium chloride    . sodium chloride       LOS: 4 days   Signature  Lala Lund M.D on 01/18/2018 at 2:00 PM  To page go to [email or web address] - password [facility]

## 2018-01-18 NOTE — Progress Notes (Signed)
Subjective: Interval History: Valerie Mathis is doing well this AM. She is s/p HD on 9/3 and tolerated it well. She denies SOB, lightheadedness, N/V, cramping with HD. She continues to have good urine output.  Objective: Vital signs in last 24 hours: Temp:  [97 F (36.1 C)-98.4 F (36.9 C)] 98.3 F (36.8 C) (09/04 0400) Pulse Rate:  [0-195] 76 (09/04 0600) Resp:  [0-40] 15 (09/03 1640) BP: (123-189)/(63-111) 169/110 (09/04 0600) SpO2:  [0 %-100 %] 98 % (09/04 0600) Weight:  [55 kg-60.3 kg] 55 kg (09/04 0500) Weight change: 1.1 kg  Intake/Output from previous day: 09/03 0701 - 09/04 0700 In: 251.5 [P.O.:250; I.V.:1.5] Out: 4120 [Urine:2620]   Intake/Output this shift: Total I/O In: 250 [P.O.:250] Out: 870 [Urine:870]  General: Well nourished female in no acute distress Pulm: Good air movement with no wheezing or crackles  CV: RRR, no murmurs, no rubs  Abdomen: Active bowel sounds, soft, non-distended, no tenderness to palpation  Extremities: Pulses palpable in all extremities, no LE edema   Lab Results: Recent Labs    01/17/18 0232 01/18/18 0401  WBC 4.5 4.2  HGB 11.3* 10.1*  HCT 35.2* 31.8*  PLT 211 157   BMET:  Recent Labs    01/17/18 0232 01/18/18 0401  NA 139 139  K 3.9 3.4*  CL 106 104  CO2 20* 26  GLUCOSE 97 101*  BUN 50* 24*  CREATININE 7.87* 5.17*  CALCIUM 8.2* 7.8*   No results for input(s): PTH in the last 72 hours.   Iron Studies: No results for input(s): IRON, TIBC, TRANSFERRIN, FERRITIN in the last 72 hours.   Studies/Results: No results found.  Scheduled: . amLODipine  10 mg Oral Daily  . calcitRIOL  0.25 mcg Oral Daily  . Chlorhexidine Gluconate Cloth  6 each Topical Q0600  . cloNIDine  0.1 mg Oral BID  . furosemide  80 mg Intravenous BID  . heparin  5,000 Units Subcutaneous Q8H  . hydrALAZINE  100 mg Oral Q8H  . isosorbide dinitrate  30 mg Oral TID  . labetalol  200 mg Oral BID  . sodium bicarbonate  1,300 mg Oral TID    Assessment/Plan:  ESRD. Patient with baseline CKD Stage V presented with hypertensive urgency and progression of her CKD. She is s/p tunnel catheter placement 9/3. She completed a session of HD yesterday with 1.5L removed. Urine output remains brisk with >2.5L out yesterday. She will need evaluation for permanent access. No HD today.   Hypertensive urgency. BP still above goal on Labetalol 200 mg BID, Isosorbide 30 mg TID, Hydralazine 100 mg TID, Clonidine 0.1 mg BID, and Amlodipine 10 mg QD. Per primary but could consider increasing the isosorbide or clonidine.   Polysubstance abuse. + Cocaine use. Discussed with patient. Amendable to treatment programs.     LOS: 4 days   [facility] 01/18/2018,6:58 AM

## 2018-01-18 NOTE — Plan of Care (Signed)
  Problem: Education: Goal: Knowledge of General Education information will improve Description Including pain rating scale, medication(s)/side effects and non-pharmacologic comfort measures Outcome: Progressing   Problem: Education: Goal: Knowledge of General Education information will improve Description Including pain rating scale, medication(s)/side effects and non-pharmacologic comfort measures Outcome: Progressing   Problem: Clinical Measurements: Goal: Ability to maintain clinical measurements within normal limits will improve Outcome: Progressing   Problem: Elimination: Goal: Will not experience complications related to bowel motility Outcome: Progressing   Problem: Pain Managment: Goal: General experience of comfort will improve Outcome: Progressing  Pt was given IV Morphine and is available PRN Problem: Safety: Goal: Ability to remain free from injury will improve Outcome: Progressing

## 2018-01-19 ENCOUNTER — Inpatient Hospital Stay (HOSPITAL_COMMUNITY): Payer: Medicaid Other | Admitting: Anesthesiology

## 2018-01-19 ENCOUNTER — Encounter (HOSPITAL_COMMUNITY)
Admission: EM | Disposition: A | Discharge status: Home / Self Care | Payer: Self-pay | Source: Home / Self Care | Attending: Internal Medicine

## 2018-01-19 ENCOUNTER — Telehealth: Payer: Self-pay | Admitting: Vascular Surgery

## 2018-01-19 HISTORY — PX: LIGATION OF ARTERIOVENOUS  FISTULA: SHX5948

## 2018-01-19 LAB — CBC
HCT: 36.2 % (ref 36.0–46.0)
Hemoglobin: 11.5 g/dL — ABNORMAL LOW (ref 12.0–15.0)
MCH: 28.3 pg (ref 26.0–34.0)
MCHC: 31.8 g/dL (ref 30.0–36.0)
MCV: 88.9 fL (ref 78.0–100.0)
PLATELETS: 133 10*3/uL — AB (ref 150–400)
RBC: 4.07 MIL/uL (ref 3.87–5.11)
RDW: 15.6 % — ABNORMAL HIGH (ref 11.5–15.5)
WBC: 4.5 10*3/uL (ref 4.0–10.5)

## 2018-01-19 LAB — PROTIME-INR
INR: 1.12
PROTHROMBIN TIME: 14.3 s (ref 11.4–15.2)

## 2018-01-19 LAB — RENAL FUNCTION PANEL
Albumin: 3.1 g/dL — ABNORMAL LOW (ref 3.5–5.0)
Anion gap: 11 (ref 5–15)
BUN: 9 mg/dL (ref 6–20)
CALCIUM: 8.5 mg/dL — AB (ref 8.9–10.3)
CO2: 27 mmol/L (ref 22–32)
Chloride: 100 mmol/L (ref 98–111)
Creatinine, Ser: 3.24 mg/dL — ABNORMAL HIGH (ref 0.44–1.00)
GFR calc non Af Amer: 18 mL/min — ABNORMAL LOW (ref >60)
GFR, EST AFRICAN AMERICAN: 21 mL/min — AB (ref >60)
Glucose, Bld: 95 mg/dL (ref 70–99)
Phosphorus: 3.4 mg/dL (ref 2.5–4.6)
Potassium: 3.5 mmol/L (ref 3.5–5.1)
SODIUM: 138 mmol/L (ref 135–145)

## 2018-01-19 SURGERY — LIGATION OF ARTERIOVENOUS  FISTULA
Anesthesia: Monitor Anesthesia Care | Site: Arm Lower | Laterality: Left

## 2018-01-19 MED ORDER — LIDOCAINE-EPINEPHRINE (PF) 1 %-1:200000 IJ SOLN
INTRAMUSCULAR | Status: DC | PRN
  Administered 2018-01-19: 5 mL

## 2018-01-19 MED ORDER — 0.9 % SODIUM CHLORIDE (POUR BTL) OPTIME
TOPICAL | Status: DC | PRN
  Administered 2018-01-19: 1000 mL

## 2018-01-19 MED ORDER — ONDANSETRON HCL 4 MG/2ML IJ SOLN
INTRAMUSCULAR | Status: DC | PRN
  Administered 2018-01-19: 4 mg via INTRAVENOUS

## 2018-01-19 MED ORDER — PROPOFOL 10 MG/ML IV BOLUS
INTRAVENOUS | Status: AC
  Filled 2018-01-19: qty 20

## 2018-01-19 MED ORDER — FENTANYL CITRATE (PF) 100 MCG/2ML IJ SOLN
INTRAMUSCULAR | Status: AC
  Filled 2018-01-19: qty 2

## 2018-01-19 MED ORDER — HEPARIN SODIUM (PORCINE) 1000 UNIT/ML IJ SOLN
3400.0000 [IU] | Freq: Once | INTRAMUSCULAR | Status: AC
  Administered 2018-01-19: 3400 [IU] via INTRAVENOUS

## 2018-01-19 MED ORDER — OXYCODONE-ACETAMINOPHEN 5-325 MG PO TABS
1.0000 | ORAL_TABLET | Freq: Four times a day (QID) | ORAL | Status: DC | PRN
  Administered 2018-01-19 – 2018-01-22 (×6): 1 via ORAL
  Filled 2018-01-19 (×8): qty 1

## 2018-01-19 MED ORDER — HEPARIN SODIUM (PORCINE) 5000 UNIT/ML IJ SOLN
5000.0000 [IU] | Freq: Three times a day (TID) | INTRAMUSCULAR | Status: DC
  Administered 2018-01-19 – 2018-01-23 (×10): 5000 [IU] via SUBCUTANEOUS
  Filled 2018-01-19 (×11): qty 1

## 2018-01-19 MED ORDER — SODIUM CHLORIDE 0.9 % IV SOLN
INTRAVENOUS | Status: DC | PRN
  Administered 2018-01-19: 07:00:00 via INTRAVENOUS

## 2018-01-19 MED ORDER — FENTANYL CITRATE (PF) 250 MCG/5ML IJ SOLN
INTRAMUSCULAR | Status: AC
  Filled 2018-01-19: qty 5

## 2018-01-19 MED ORDER — MEPERIDINE HCL 50 MG/ML IJ SOLN: 6.2500 mg | INTRAMUSCULAR | Status: DC | PRN

## 2018-01-19 MED ORDER — PROPOFOL 500 MG/50ML IV EMUL
INTRAVENOUS | Status: DC | PRN
  Administered 2018-01-19: 75 ug/kg/min via INTRAVENOUS

## 2018-01-19 MED ORDER — FENTANYL CITRATE (PF) 100 MCG/2ML IJ SOLN
INTRAMUSCULAR | Status: DC | PRN
  Administered 2018-01-19: 25 ug via INTRAVENOUS
  Administered 2018-01-19: 50 ug via INTRAVENOUS

## 2018-01-19 MED ORDER — ONDANSETRON HCL 4 MG/2ML IJ SOLN: 4.0000 mg | Freq: Once | INTRAMUSCULAR | Status: DC | PRN

## 2018-01-19 MED ORDER — MIDAZOLAM HCL 5 MG/5ML IJ SOLN
INTRAMUSCULAR | Status: DC | PRN
  Administered 2018-01-19: 2 mg via INTRAVENOUS

## 2018-01-19 MED ORDER — MIDAZOLAM HCL 2 MG/2ML IJ SOLN
INTRAMUSCULAR | Status: AC
  Filled 2018-01-19: qty 2

## 2018-01-19 MED ORDER — SODIUM CHLORIDE 0.9 % IV SOLN
INTRAVENOUS | Status: AC
  Filled 2018-01-19: qty 1.2

## 2018-01-19 MED ORDER — FENTANYL CITRATE (PF) 100 MCG/2ML IJ SOLN
25.0000 ug | INTRAMUSCULAR | Status: DC | PRN
  Administered 2018-01-19 (×2): 25 ug via INTRAVENOUS

## 2018-01-19 MED ORDER — HYDROCODONE-ACETAMINOPHEN 7.5-325 MG PO TABS: 1.0000 | ORAL_TABLET | Freq: Once | ORAL | Status: DC | PRN

## 2018-01-19 MED ORDER — LIDOCAINE-EPINEPHRINE (PF) 1 %-1:200000 IJ SOLN
INTRAMUSCULAR | Status: AC
  Filled 2018-01-19: qty 30

## 2018-01-19 MED ORDER — CEFAZOLIN SODIUM-DEXTROSE 2-4 GM/100ML-% IV SOLN
INTRAVENOUS | Status: AC
  Filled 2018-01-19: qty 100

## 2018-01-19 SURGICAL SUPPLY — 36 items
BANDAGE ACE 4X5 VEL STRL LF (GAUZE/BANDAGES/DRESSINGS) ×3 IMPLANT
CANISTER SUCT 3000ML PPV (MISCELLANEOUS) ×3 IMPLANT
CLIP VESOCCLUDE MED 6/CT (CLIP) ×3 IMPLANT
CLIP VESOCCLUDE SM WIDE 6/CT (CLIP) ×3 IMPLANT
DERMABOND ADVANCED (GAUZE/BANDAGES/DRESSINGS) ×2
DERMABOND ADVANCED .7 DNX12 (GAUZE/BANDAGES/DRESSINGS) ×1 IMPLANT
ELECT REM PT RETURN 9FT ADLT (ELECTROSURGICAL) ×3
ELECTRODE REM PT RTRN 9FT ADLT (ELECTROSURGICAL) ×1 IMPLANT
GLOVE BIO SURGEON STRL SZ 6.5 (GLOVE) ×6 IMPLANT
GLOVE BIO SURGEONS STRL SZ 6.5 (GLOVE) ×3
GLOVE BIOGEL PI IND STRL 7.0 (GLOVE) ×1 IMPLANT
GLOVE BIOGEL PI IND STRL 7.5 (GLOVE) ×2 IMPLANT
GLOVE BIOGEL PI INDICATOR 7.0 (GLOVE) ×2
GLOVE BIOGEL PI INDICATOR 7.5 (GLOVE) ×4
GLOVE SURG SS PI 7.5 STRL IVOR (GLOVE) ×3 IMPLANT
GOWN STRL REUS W/ TWL LRG LVL3 (GOWN DISPOSABLE) ×2 IMPLANT
GOWN STRL REUS W/ TWL XL LVL3 (GOWN DISPOSABLE) ×1 IMPLANT
GOWN STRL REUS W/TWL LRG LVL3 (GOWN DISPOSABLE) ×4
GOWN STRL REUS W/TWL XL LVL3 (GOWN DISPOSABLE) ×2
HEMOSTAT SNOW SURGICEL 2X4 (HEMOSTASIS) IMPLANT
KIT BASIN OR (CUSTOM PROCEDURE TRAY) ×3 IMPLANT
KIT TURNOVER KIT B (KITS) ×3 IMPLANT
NS IRRIG 1000ML POUR BTL (IV SOLUTION) ×3 IMPLANT
PACK CV ACCESS (CUSTOM PROCEDURE TRAY) ×3 IMPLANT
PAD ARMBOARD 7.5X6 YLW CONV (MISCELLANEOUS) ×6 IMPLANT
SUT ETHILON 3 0 PS 1 (SUTURE) IMPLANT
SUT PROLENE 5 0 C 1 24 (SUTURE) ×3 IMPLANT
SUT PROLENE 6 0 BV (SUTURE) ×3 IMPLANT
SUT SILK 0 (SUTURE) ×3 IMPLANT
SUT SILK 0 TIES 10X30 (SUTURE) ×3 IMPLANT
SUT VIC AB 3-0 SH 27 (SUTURE) ×2
SUT VIC AB 3-0 SH 27X BRD (SUTURE) ×1 IMPLANT
SUT VICRYL 4-0 PS2 18IN ABS (SUTURE) ×3 IMPLANT
TOWEL GREEN STERILE (TOWEL DISPOSABLE) ×3 IMPLANT
UNDERPAD 30X30 (UNDERPADS AND DIAPERS) ×3 IMPLANT
WATER STERILE IRR 1000ML POUR (IV SOLUTION) ×3 IMPLANT

## 2018-01-19 NOTE — Op Note (Signed)
    Patient name: Amna Welker MRN: [id number] DOB: 30-Dec-1985 Sex: female  01/19/2018 Pre-operative Diagnosis: Central vein occlusion Post-operative diagnosis:  Same Surgeon:  Annamarie Major Assistants: Aldona Bar Ryne Procedure:   Ligation of left basilic vein fistula Anesthesia: MAC Blood Loss: Minimal Specimens: None   Indications: The patient has recently undergone a first stage left basilic vein fistula.  She developed significant arm swelling.  A fistulogram revealed an occluded central venous system.  She is here today for ligation  Procedure:  The patient was identified in the holding area and taken to [place] [facility]  The patient was then placed supine on the table. MAC anesthesia was administered.  The patient was prepped and draped in the usual sterile fashion.  A time out was called and antibiotics were administered.  1% lidocaine was used for local anesthesia.  The patient's previous transverse antecubital incision was opened with a 10 blade.  A fair amount of lymphatic drainage was evacuated.  I then dissected out the fistula up to the arterial venous anastomosis.  I then ligated the fistula between 2 0 silk ties.  I then oversewed the vein on the arterial side with a 5-0 Prolene.  The wound was then irrigated and closed with 2 layers of 3-0 Vicryl followed by Dermabond.  There were no immediate complications.   Disposition: To PACU stable   V. Annamarie Major, M.D. [facility] Office: [phone] Pager:  [phone]

## 2018-01-19 NOTE — Progress Notes (Signed)
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 She was found to have possible progression of her aortic dissection, worsening AKI, and volume overload.    Significant Events: 8/31 admit 8/31 venous duplex LUE - subclavian partial thrombosis  9/2 TRH assumed care  9/3 R IJ palindrome cath place by VVS - venogram noting L subclavian vein occlusion   9/5 left arm AV fistula ligation by Dr. Trula Slade   Subjective:  Patient in bed, appears comfortable, denies any headache, no fever, no chest pain or pressure, no shortness of breath , no abdominal pain. No focal weakness.  Postop left arm discomfort and pain.  Assessment & Plan:  Hypertensive emergency -   In setting of cocaine abuse and medication noncompliance - extensively counseled to quit, she is currently on combination of Norvasc, labetalol, imdur and Catapres.  Currently on labetalol counseled extensively not to use cocaine while on it, risks and benefits explained, she accepts being on labetalol..   Descending thoracic aortic dissection s/p graft repair 2018  - ?extension of a prior descending thoracic aortic dissection suggested by limited CT view of aorta incidentally on rena stone CT - not seen on MRI July 2019 - TCTS and VVS have evaluated - pt has no sx to suggest an acute issue at this time - medical tx - strict BP control.  Acute on chronic diastolic HF, TTE 08/6801 noted EF 55-60%, G2DD, PAP 42 mm Hg -mild fluid overload fluid being removed during HD, continue blood pressure control as a #1 above.  ARF on CKD stage V -  being followed by nephrology and vascular surgery,  tunneled catheter placed on 01/17/2018 for dialysis access, currently undergoing daily dialysis.    Anemia of chronic disease.  Stable no acute issues.  LUE swelling around the site of left arm AV fistula due to L subclavian vein obstruction  -  left subclavian vein occlusion per venogram 9/3 , she underwent fistula ligation by Dr. Trula Slade on 01/19/2018.  Cocaine Abuse  -  Appears to be the root of her medical issues - failure to abstain will lead to her death      DVT prophylaxis: SQ heparin     Code Status: FULL CODE Family Communication: no family present at time of exam  Disposition Plan: SDU   Consultants:  Nephrology  VVS TCTS PCCM  Procedures  L.Arm AV fistula ligation by Dr. Trula Slade on 01/19/2018.   Antimicrobials:  none   Objective: Blood pressure 126/72, pulse 72, temperature 97.8 F (36.6 C), temperature source Oral, resp. rate 18, height 5\' 3"  (1.6 m), weight 54.5 kg, last menstrual period 12/15/2017, SpO2 97 %.  Intake/Output Summary (Last 24 hours) at 01/19/2018 1247 Last data filed at 01/19/2018 0900 Gross per 24 hour  Intake 576.72 ml  Output 1002 ml  Net -425.28 ml   Filed Weights   01/18/18 0500 01/18/18 1336 01/19/18 0500  Weight: 55 kg 56.4 kg 54.5 kg    Examination:  Awake Alert, Oriented X 3, No new F.N deficits, Normal affect [place].AT,PERRAL Supple Neck,No JVD, No cervical lymphadenopathy appriciated.  Symmetrical Chest wall movement, Good air  movement bilaterally, CTAB RRR,No Gallops, Rubs or new Murmurs, No Parasternal Heave +ve B.Sounds, Abd Soft, No tenderness, No organomegaly appriciated, No rebound - guarding or rigidity. No Cyanosis, L arm under compression bandage   CBC: Recent Labs  Lab 01/17/18 0232 01/18/18 0401 01/19/18 0318  WBC 4.5 4.2 4.5  HGB 11.3* 10.1* 11.5*  HCT 35.2* 31.8* 36.2  MCV 90.0 89.6 88.9  PLT 211 157 630*   Basic Metabolic Panel: Recent Labs  Lab 01/14/18 0628  01/15/18 0619  01/17/18 0232  01/18/18 0401 01/19/18 0318  NA  --    < > 136   < > 139 139 138  K  --    < > 3.6   < > 3.9 3.4* 3.5  CL  --    < > 110   < > 106 104 100  CO2  --    < > 17*   < > 20* 26 27  GLUCOSE  --    < > 93   < > 97 101* 95  BUN  --    < > 54*   < > 50* 24* 9  CREATININE  --    < > 7.68*   < > 7.87* 5.17* 3.24*  CALCIUM  --    < > 7.7*   < > 8.2* 7.8* 8.5*  MG 2.5*  --  2.2  --   --   --   --   PHOS 5.5*  --  4.5   < > 5.1* 4.2 3.4   < > = values in this interval not displayed.   GFR: Estimated Creatinine Clearance: 21.2 mL/min (A) (by C-G formula based on SCr of 3.24 mg/dL (H)).  Liver Function Tests: Recent Labs  Lab 01/16/18 0250 01/17/18 0232 01/18/18 0401 01/19/18 0318  ALBUMIN 3.0* 3.5 2.9* 3.1*    HbA1C: Hgb A1c MFr Bld  Date/Time Value Ref Range Status  10/20/2017 02:28 AM 5.2 4.8 - 5.6 % Final    Comment:    (NOTE) Pre diabetes:          5.7%-6.4% Diabetes:              >6.4% Glycemic control for   <7.0% adults with diabetes     Recent Results (from the past 240 hour(s))  MRSA PCR Screening     Status: None   Collection Time: 01/14/18  7:09 AM  Result Value Ref Range Status   MRSA by PCR NEGATIVE NEGATIVE Final    Comment:        The GeneXpert MRSA Assay (FDA approved for NASAL specimens only), is one component of a comprehensive MRSA colonization surveillance program. It is not intended to diagnose MRSA infection nor to guide or monitor treatment for MRSA infections. Performed at [facility], [place] [address] [place], [place] [address]      Scheduled Meds: . amLODipine  10 mg Oral Daily  . calcitRIOL  0.25 mcg Oral Daily  . cloNIDine  0.1 mg Oral BID  . fentaNYL      . furosemide  80 mg Intravenous BID  . heparin  5,000 Units Subcutaneous Q8H  . hydrALAZINE  100 mg Oral Q8H  . isosorbide dinitrate  30 mg Oral TID  . labetalol  200 mg Oral BID  . sodium bicarbonate  1,300 mg Oral TID   Continuous Infusions:    LOS: 5 days    Signature  Lala Lund M.D on 01/19/2018 at 12:47 PM  To page  go to [email or web address] - password [facility]

## 2018-01-19 NOTE — Anesthesia Postprocedure Evaluation (Signed)
Anesthesia Post Note  Patient: Valerie Mathis  Procedure(s) Performed: LIGATION OF ARTERIOVENOUS  FISTULA left  ARM (Left Arm Lower)     Patient location during evaluation: PACU Anesthesia Type: MAC Level of consciousness: awake and alert and oriented Pain management: pain level controlled Vital Signs Assessment: post-procedure vital signs reviewed and stable Respiratory status: spontaneous breathing, nonlabored ventilation and respiratory function stable Cardiovascular status: stable and blood pressure returned to baseline Postop Assessment: no apparent nausea or vomiting Anesthetic complications: no    Last Vitals:  Vitals:   01/19/18 0908 01/19/18 0909  BP:    Pulse: 77   Resp: 14   Temp:  36.7 C  SpO2: 95%     Last Pain:  Vitals:   01/19/18 0900  TempSrc:   PainSc: 8                  Anitta Tenny A.

## 2018-01-19 NOTE — Anesthesia Preprocedure Evaluation (Signed)
Anesthesia Evaluation  Patient identified by MRN, date of birth, ID band Patient awake    Reviewed: Allergy & Precautions, NPO status , Patient's Chart, lab work & pertinent test results  Airway Mallampati: II  TM Distance: >3 FB     Dental  (+) Teeth Intact   Pulmonary Current Smoker,    breath sounds clear to auscultation       Cardiovascular hypertension, + Peripheral Vascular Disease and +CHF   Rhythm:Regular Rate:Normal     Neuro/Psych negative neurological ROS  negative psych ROS   GI/Hepatic Neg liver ROS,   Endo/Other    Renal/GU ESRFRenal disease  negative genitourinary   Musculoskeletal negative musculoskeletal ROS (+)   Abdominal   Peds  Hematology  (+) anemia ,   Anesthesia Other Findings   Reproductive/Obstetrics                             Anesthesia Physical  Anesthesia Plan  ASA: III  Anesthesia Plan: MAC   Post-op Pain Management:    Induction: Intravenous  PONV Risk Score and Plan: Ondansetron, Dexamethasone and Midazolam  Airway Management Planned:   Additional Equipment:   Intra-op Plan:   Post-operative Plan:   Informed Consent: I have reviewed the patients History and Physical, chart, labs and discussed the procedure including the risks, benefits and alternatives for the proposed anesthesia with the patient or authorized representative who has indicated his/her understanding and acceptance.   Dental advisory given  Plan Discussed with: CRNA, Anesthesiologist and Surgeon  Anesthesia Plan Comments:         Anesthesia Quick Evaluation

## 2018-01-19 NOTE — Anesthesia Procedure Notes (Signed)
Procedure Name: MAC Date/Time: 01/19/2018 7:45 AM Performed by: Neldon Newport, CRNA Pre-anesthesia Checklist: Timeout performed, Patient being monitored, Suction available, Emergency Drugs available and Patient identified Patient Re-evaluated:Patient Re-evaluated prior to induction Oxygen Delivery Method: Simple face mask
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
## 2018-01-19 NOTE — H&P (Signed)
Patient name: Valerie Mathis MRN: [id number] DOB: [birth year] Sex: female   HISTORY OF PRESENT ILLNESS:   Valerie Mathis is a 32 y.o. 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 [MAR Hold] 0.9 %  sodium chloride infusion  250 mL Intravenous PRN Serafina Mitchell, MD 10 mL/hr at 01/17/18 0900    . [MAR Hold] 0.9 %  sodium chloride infusion  100 mL Intravenous PRN Estanislado Emms, MD      . Doug Sou Hold] 0.9 %  sodium chloride infusion  100 mL Intravenous PRN Estanislado Emms, MD      . Doug Sou Hold] 0.9 %  sodium chloride infusion  100 mL Intravenous PRN Estanislado Emms, MD      . Doug Sou Hold] 0.9 %  sodium chloride infusion  100 mL Intravenous PRN Estanislado Emms, MD      . Doug Sou Hold] acetaminophen (TYLENOL) tablet 650 mg  650 mg Oral Q6H PRN Serafina Mitchell, MD   650 mg at 01/17/18 2347  . [MAR Hold] alteplase (CATHFLO ACTIVASE) injection 2 mg  2 mg Intracatheter Once PRN Estanislado Emms, MD      . Doug Sou Hold] amLODipine (NORVASC) tablet 10 mg  10 mg Oral Daily Cherene Altes, MD   10 mg at 01/18/18 0950  . [MAR Hold] calcitRIOL (ROCALTROL) capsule 0.25 mcg  0.25 mcg Oral Daily Serafina Mitchell, MD   0.25 mcg at 01/18/18 0950  . [MAR Hold] ceFAZolin (ANCEF) IVPB 2g/100 mL premix  2 g Intravenous To SS-Surg Serafina Mitchell, MD      . Doug Sou Hold] Chlorhexidine Gluconate Cloth 2 % PADS 6 each  6 each Topical Q0600 Estanislado Emms, MD   6 each at 01/19/18 [phone]  . [MAR Hold] Chlorhexidine Gluconate Cloth 2 % PADS 6 each  6 each Topical Q0600 Estanislado Emms, MD      . Doug Sou Hold] cloNIDine (CATAPRES) tablet 0.1 mg  0.1 mg Oral BID Serafina Mitchell, MD   0.1 mg at 01/18/18 2250  . [MAR Hold] furosemide (LASIX) injection 80 mg  80 mg Intravenous BID Serafina Mitchell, MD   80 mg at 01/18/18 1817  . [MAR Hold] heparin  injection 1,000 Units  1,000 Units Dialysis PRN Estanislado Emms, MD      . Doug Sou Hold] heparin injection 1,000 Units  1,000 Units Dialysis PRN Estanislado Emms, MD      . Doug Sou Hold] heparin injection 1,200 Units  20 Units/kg Dialysis PRN Estanislado Emms, MD      . [facility] Hold] heparin injection 5,000 Units  5,000 Units Subcutaneous Q8H Serafina Mitchell, MD   5,000 Units at 01/19/18 0556  . [MAR Hold] hydrALAZINE (APRESOLINE) tablet 100 mg  100 mg Oral Q8H Serafina Mitchell, MD   100 mg at 01/19/18 0555  . [MAR Hold] isosorbide dinitrate (ISORDIL) tablet 30 mg  30 mg Oral TID Cherene Altes, MD   30 mg at 01/18/18 2250  . [MAR Hold] labetalol (NORMODYNE) tablet 200 mg  200 mg Oral BID Cherene Altes, MD   200 mg at 01/18/18 2250  . [MAR Hold] labetalol (NORMODYNE,TRANDATE) injection 10 mg  10 mg Intravenous Q2H PRN Serafina Mitchell, MD   10 mg at 01/18/18 1818  . [MAR Hold] lidocaine (PF) (XYLOCAINE) 1 % injection  5 mL  5 mL Intradermal PRN Estanislado Emms, MD      . Doug Sou Hold] lidocaine-prilocaine (EMLA) cream 1 application  1 application Topical PRN Estanislado Emms, MD      . Doug Sou Hold] pentafluoroprop-tetrafluoroeth (GEBAUERS) aerosol 1 application  1 application Topical PRN Estanislado Emms, MD      . Doug Sou Hold] sodium bicarbonate tablet 1,300 mg  1,300 mg Oral TID Serafina Mitchell, MD   1,300 mg at 01/18/18 2251    REVIEW OF SYSTEMS:   [X]  denotes positive finding, [ ]  denotes negative finding Cardiac  Comments:  Chest pain or chest pressure:    Shortness of breath upon exertion:    Short of breath when lying flat:    Irregular heart rhythm:    Constitutional    Fever or chills:      PHYSICAL EXAM:   Vitals:   01/18/18 2315 01/19/18 0411 01/19/18 0500 01/19/18 0555  BP: (!) 188/97 (!) 169/103  (!) 150/81  Pulse: 88 91    Resp: 19 14    Temp: 97.6 F (36.4 C) 97.8 F (36.6 C)    TempSrc: Oral Oral    SpO2: 99% 98%    Weight:   54.5 kg   Height:        GENERAL: The  patient is a well-nourished female, in no acute distress. The vital signs are documented above. CARDIOVASCULAR: There is a regular rate and rhythm. PULMONARY: Non-labored respirations   STUDIES:   none   MEDICAL ISSUES:   Plan for fistula ligation  Annamarie Major, MD [facility] [phone] Pager [phone]

## 2018-01-19 NOTE — Telephone Encounter (Signed)
sch appt phone NA mld ltr 02-14-18 830am p/o MD

## 2018-01-19 NOTE — Transfer of Care (Signed)
Immediate Anesthesia Transfer of Care Note  Patient: Valerie Mathis  Procedure(s) Performed: LIGATION OF ARTERIOVENOUS  FISTULA left  ARM (Left Arm Lower)  Patient Location: PACU  Anesthesia Type:MAC  Level of Consciousness: awake, alert  and oriented  Airway & Oxygen Therapy: Patient Spontanous Breathing  Post-op Assessment: Report given to RN, Post -op Vital signs reviewed and stable and Patient moving all extremities X 4  Post vital signs: Reviewed and stable  Last Vitals:  Vitals Value Taken Time  BP    Temp    Pulse 86 01/19/2018  8:26 AM  Resp 18 01/19/2018  8:26 AM  SpO2 95 % 01/19/2018  8:26 AM  Vitals shown include unvalidated device data.  Last Pain:  Vitals:   01/19/18 0411  TempSrc: Oral  PainSc:          Complications: No apparent anesthesia complications

## 2018-01-20 ENCOUNTER — Encounter (HOSPITAL_COMMUNITY): Payer: Self-pay | Admitting: Surgery

## 2018-01-20 LAB — RENAL FUNCTION PANEL
ALBUMIN: 2.9 g/dL — AB (ref 3.5–5.0)
Anion gap: 14 (ref 5–15)
BUN: 16 mg/dL (ref 6–20)
CALCIUM: 8.1 mg/dL — AB (ref 8.9–10.3)
CO2: 28 mmol/L (ref 22–32)
Chloride: 97 mmol/L — ABNORMAL LOW (ref 98–111)
Creatinine, Ser: 4.75 mg/dL — ABNORMAL HIGH (ref 0.44–1.00)
GFR calc non Af Amer: 11 mL/min — ABNORMAL LOW (ref >60)
GFR, EST AFRICAN AMERICAN: 13 mL/min — AB (ref >60)
Glucose, Bld: 91 mg/dL (ref 70–99)
PHOSPHORUS: 4.6 mg/dL (ref 2.5–4.6)
POTASSIUM: 3.5 mmol/L (ref 3.5–5.1)
SODIUM: 139 mmol/L (ref 135–145)

## 2018-01-20 LAB — CBC
HEMATOCRIT: 32.1 % — AB (ref 36.0–46.0)
HEMOGLOBIN: 9.7 g/dL — AB (ref 12.0–15.0)
MCH: 27.9 pg (ref 26.0–34.0)
MCHC: 30.2 g/dL (ref 30.0–36.0)
MCV: 92.2 fL (ref 78.0–100.0)
Platelets: 128 10*3/uL — ABNORMAL LOW (ref 150–400)
RBC: 3.48 MIL/uL — AB (ref 3.87–5.11)
RDW: 15.8 % — ABNORMAL HIGH (ref 11.5–15.5)
WBC: 4.6 10*3/uL (ref 4.0–10.5)

## 2018-01-20 MED ORDER — HEPARIN SODIUM (PORCINE) 1000 UNIT/ML DIALYSIS
1000.0000 [IU] | INTRAMUSCULAR | Status: DC | PRN
  Filled 2018-01-20: qty 1

## 2018-01-20 MED ORDER — PENTAFLUOROPROP-TETRAFLUOROETH EX AERO: 1.0000 "application " | INHALATION_SPRAY | CUTANEOUS | Status: DC | PRN

## 2018-01-20 MED ORDER — CHLORHEXIDINE GLUCONATE CLOTH 2 % EX PADS
6.0000 | MEDICATED_PAD | Freq: Every day | CUTANEOUS | Status: DC
  Administered 2018-01-20 – 2018-01-23 (×3): 6 via TOPICAL

## 2018-01-20 MED ORDER — CLONIDINE HCL 0.1 MG PO TABS
0.1000 mg | ORAL_TABLET | Freq: Three times a day (TID) | ORAL | Status: DC
  Administered 2018-01-20 – 2018-01-23 (×9): 0.1 mg via ORAL
  Filled 2018-01-20 (×9): qty 1

## 2018-01-20 MED ORDER — LIDOCAINE-PRILOCAINE 2.5-2.5 % EX CREA
1.0000 "application " | TOPICAL_CREAM | CUTANEOUS | Status: DC | PRN
  Filled 2018-01-20: qty 5

## 2018-01-20 MED ORDER — ALTEPLASE 2 MG IJ SOLR: 2.0000 mg | Freq: Once | INTRAMUSCULAR | Status: DC | PRN

## 2018-01-20 MED ORDER — SODIUM CHLORIDE 0.9 % IV SOLN: 100.0000 mL | INTRAVENOUS | Status: DC | PRN

## 2018-01-20 MED ORDER — LIDOCAINE HCL (PF) 1 % IJ SOLN: 5.0000 mL | INTRAMUSCULAR | Status: DC | PRN

## 2018-01-20 MED ORDER — HEPARIN SODIUM (PORCINE) 1000 UNIT/ML DIALYSIS
20.0000 [IU]/kg | INTRAMUSCULAR | Status: DC | PRN
  Filled 2018-01-20: qty 2

## 2018-01-20 NOTE — Progress Notes (Signed)
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 She was found to have possible progression of her aortic dissection, worsening AKI, and volume overload.    Significant Events: 8/31 admit 8/31 venous duplex LUE - subclavian partial thrombosis  9/2 TRH assumed care  9/3 R IJ palindrome cath place by VVS - venogram noting L subclavian vein occlusion   9/5 left arm AV fistula ligation by Dr. Trula Slade   Subjective:  Patient in bed, appears comfortable, denies any headache, no fever, no chest pain or pressure, no shortness of breath , no abdominal pain. No focal weakness.   Assessment & Plan:  Hypertensive emergency -   In setting of cocaine abuse and medication noncompliance - extensively counseled to quit, she is currently on combination of Norvasc, labetalol, imdur and Catapres.  Her present dose has been adjusted for better control.  Kindly note she is currently on labetalol counseled extensively not to use cocaine while on it, risks and benefits explained, she accepts being on labetalol.  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 extension of a prior descending thoracic aortic dissection suggested by limited CT view of aorta incidentally on rena stone CT - not seen on MRI July 2019 - TCTS and VVS have evaluated - pt has no sx to suggest an acute issue at this time - medical tx - strict BP control.  Acute on chronic diastolic HF, TTE 12/2421 noted EF 55-60%, G2DD, PAP 42 mm Hg -mild fluid overload fluid being removed during HD, continue blood pressure control as a #1 above.  ARF on CKD stage V -  being followed  by nephrology and vascular surgery, tunneled catheter placed on 01/17/2018 for dialysis access, currently undergoing daily dialysis.  Discussed his case with nephrologist Dr. Florene Glen CLIP process underway.  Anemia of chronic disease.  Stable no acute issues.  LUE swelling around the site of left arm AV fistula due to L subclavian vein obstruction  -  left subclavian vein occlusion per venogram 9/3 , she underwent L. Arm basilic fistula ligation by Dr. Trula Slade on 01/19/2018.  Cocaine Abuse  -  Appears to be the root of her medical issues - failure to abstain will lead to her death      DVT prophylaxis: SQ heparin     Code Status: FULL CODE Family Communication: no family present at time of exam  Disposition Plan: Tele   Consultants:  Nephrology  VVS TCTS PCCM  Procedures  L.Arm AV fistula ligation by Dr. Trula Slade on 01/19/2018.   Antimicrobials:  none   Objective: Blood pressure (!) 150/75, pulse 86, temperature 98.3 F (36.8 C), temperature source Oral, resp. rate 18, height 5\' 3"  (1.6 m), weight 53.9 kg, last menstrual period 12/15/2017, SpO2 99 %.  Intake/Output Summary (Last 24 hours) at 01/20/2018 1201 Last data filed at 01/20/2018 1002 Gross per 24 hour  Intake 860 ml  Output 800 ml  Net 60 ml   Filed Weights   01/18/18 1336 01/19/18 0500 01/20/18 0500  Weight: 56.4 kg 54.5 kg 53.9 kg    Examination:  Awake Alert, Oriented X 3,  No new F.N deficits, Normal affect [place].AT,PERRAL Supple Neck,No JVD, No cervical lymphadenopathy appriciated.  Symmetrical Chest wall movement, Good air movement bilaterally, CTAB RRR,No Gallops, Rubs or new Murmurs, No Parasternal Heave +ve B.Sounds, Abd Soft, No tenderness, No organomegaly appriciated, No rebound - guarding or rigidity. No Cyanosis,  L arm under compression bandage   CBC: Recent Labs  Lab 01/17/18 0232 01/18/18 0401 01/19/18 0318  WBC 4.5 4.2 4.5  HGB 11.3* 10.1* 11.5*  HCT 35.2* 31.8* 36.2  MCV 90.0 89.6 88.9  PLT  211 157 716*   Basic Metabolic Panel: Recent Labs  Lab 01/14/18 0628  01/15/18 0619  01/17/18 0232 01/18/18 0401 01/19/18 0318  NA  --    < > 136   < > 139 139 138  K  --    < > 3.6   < > 3.9 3.4* 3.5  CL  --    < > 110   < > 106 104 100  CO2  --    < > 17*   < > 20* 26 27  GLUCOSE  --    < > 93   < > 97 101* 95  BUN  --    < > 54*   < > 50* 24* 9  CREATININE  --    < > 7.68*   < > 7.87* 5.17* 3.24*  CALCIUM  --    < > 7.7*   < > 8.2* 7.8* 8.5*  MG 2.5*  --  2.2  --   --   --   --   PHOS 5.5*  --  4.5   < > 5.1* 4.2 3.4   < > = values in this interval not displayed.   GFR: Estimated Creatinine Clearance: 21.2 mL/min (A) (by C-G formula based on SCr of 3.24 mg/dL (H)).  Liver Function Tests: Recent Labs  Lab 01/16/18 0250 01/17/18 0232 01/18/18 0401 01/19/18 0318  ALBUMIN 3.0* 3.5 2.9* 3.1*    HbA1C: Hgb A1c MFr Bld  Date/Time Value Ref Range Status  10/20/2017 02:28 AM 5.2 4.8 - 5.6 % Final    Comment:    (NOTE) Pre diabetes:          5.7%-6.4% Diabetes:              >6.4% Glycemic control for   <7.0% adults with diabetes     Recent Results (from the past 240 hour(s))  MRSA PCR Screening     Status: None   Collection Time: 01/14/18  7:09 AM  Result Value Ref Range Status   MRSA by PCR NEGATIVE NEGATIVE Final    Comment:        The GeneXpert MRSA Assay (FDA approved for NASAL specimens only), is one component of a comprehensive MRSA colonization surveillance program. It is not intended to diagnose MRSA infection nor to guide or monitor treatment for MRSA infections. Performed at [facility], [place] [address], [place], [place] [address]      Scheduled Meds: . amLODipine  10 mg Oral Daily  . calcitRIOL  0.25 mcg Oral Daily  . Chlorhexidine Gluconate Cloth  6 each Topical Q0600  . cloNIDine  0.1 mg Oral TID  . furosemide  80 mg Intravenous BID  . heparin  5,000 Units Subcutaneous Q8H  . hydrALAZINE  100 mg Oral Q8H  . isosorbide dinitrate   30 mg Oral TID  . labetalol  200 mg Oral BID  . sodium bicarbonate  1,300 mg Oral TID  Continuous Infusions:    LOS: 6 days   Signature  Lala Lund M.D on 01/20/2018 at 12:01 PM  To page go to [email or web address] - password [facility]

## 2018-01-20 NOTE — Progress Notes (Signed)
[facility] [place]  Subjective  - doing well with mild discomfort in the left AC incisional area.   Objective (!) 170/100 82 98.5 F (36.9 C) (Oral) 17 99%  Intake/Output Summary (Last 24 hours) at 01/20/2018 0817 Last data filed at 01/19/2018 1700 Gross per 24 hour  Intake 836.72 ml  Output 802 ml  Net 34.72 ml    Left UE palable radial pulse, grip 5/5 and sensation intact. Ace wrap reapplied secondary to fullness at incision, incision soft without evidence of expansion.     Assessment/Planning: Central vein occlusion POD # 1 Ligation of left basilic vein fistula  She will f/u in our office for access planning in 4 weeks. She is stable from a vascular point of view.  Roxy Horseman 01/20/2018 8:17 AM --  Laboratory Lab Results: Recent Labs    01/18/18 0401 01/19/18 0318  WBC 4.2 4.5  HGB 10.1* 11.5*  HCT 31.8* 36.2  PLT 157 133*   BMET Recent Labs    01/18/18 0401 01/19/18 0318  NA 139 138  K 3.4* 3.5  CL 104 100  CO2 26 27  GLUCOSE 101* 95  BUN 24* 9  CREATININE 5.17* 3.24*  CALCIUM 7.8* 8.5*    COAG Lab Results  Component Value Date   INR 1.12 01/19/2018   INR 1.19 11/29/2017   No results found for: PTT

## 2018-01-20 NOTE — Progress Notes (Signed)
Left breast enlarged, firm, and tender to the touch. Her skin has an orange peel appearance with dimpled pores. No obvious redness or warmth and the patient is afebrile. She states her breast has been swollen and tender since her left AV fistula was operated on. She also complains of pain in her left arm at the AV fistula site. Distal circulation is within normal limits. Notified Dr. Candiss Norse and will continue to monitor.
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
## 2018-01-21 LAB — CBC
HEMATOCRIT: 36.3 % (ref 36.0–46.0)
Hemoglobin: 11.2 g/dL — ABNORMAL LOW (ref 12.0–15.0)
MCH: 28 pg (ref 26.0–34.0)
MCHC: 30.9 g/dL (ref 30.0–36.0)
MCV: 90.8 fL (ref 78.0–100.0)
Platelets: 105 10*3/uL — ABNORMAL LOW (ref 150–400)
RBC: 4 MIL/uL (ref 3.87–5.11)
RDW: 15.4 % (ref 11.5–15.5)
WBC: 4.9 10*3/uL (ref 4.0–10.5)

## 2018-01-21 LAB — RENAL FUNCTION PANEL
ALBUMIN: 3.2 g/dL — AB (ref 3.5–5.0)
Anion gap: 11 (ref 5–15)
BUN: 9 mg/dL (ref 6–20)
CALCIUM: 8.8 mg/dL — AB (ref 8.9–10.3)
CO2: 26 mmol/L (ref 22–32)
Chloride: 99 mmol/L (ref 98–111)
Creatinine, Ser: 3.4 mg/dL — ABNORMAL HIGH (ref 0.44–1.00)
GFR calc Af Amer: 20 mL/min — ABNORMAL LOW (ref >60)
GFR calc non Af Amer: 17 mL/min — ABNORMAL LOW (ref >60)
Glucose, Bld: 128 mg/dL — ABNORMAL HIGH (ref 70–99)
PHOSPHORUS: 4.2 mg/dL (ref 2.5–4.6)
POTASSIUM: 4.2 mmol/L (ref 3.5–5.1)
SODIUM: 136 mmol/L (ref 135–145)

## 2018-01-21 MED ORDER — LIDOCAINE HCL (PF) 1 % IJ SOLN: 5.0000 mL | INTRAMUSCULAR | Status: DC | PRN

## 2018-01-21 MED ORDER — HEPARIN SODIUM (PORCINE) 1000 UNIT/ML DIALYSIS
1000.0000 [IU] | INTRAMUSCULAR | Status: DC | PRN
  Filled 2018-01-21: qty 1

## 2018-01-21 MED ORDER — CHLORHEXIDINE GLUCONATE CLOTH 2 % EX PADS: 6.0000 | MEDICATED_PAD | Freq: Every day | CUTANEOUS | Status: DC

## 2018-01-21 MED ORDER — SODIUM CHLORIDE 0.9 % IV SOLN: 100.0000 mL | INTRAVENOUS | Status: DC | PRN

## 2018-01-21 MED ORDER — ALTEPLASE 2 MG IJ SOLR: 2.0000 mg | Freq: Once | INTRAMUSCULAR | Status: DC | PRN

## 2018-01-21 MED ORDER — LIDOCAINE-PRILOCAINE 2.5-2.5 % EX CREA
1.0000 "application " | TOPICAL_CREAM | CUTANEOUS | Status: DC | PRN
  Filled 2018-01-21: qty 5

## 2018-01-21 MED ORDER — PENTAFLUOROPROP-TETRAFLUOROETH EX AERO: 1.0000 "application " | INHALATION_SPRAY | CUTANEOUS | Status: DC | PRN

## 2018-01-21 MED ORDER — HEPARIN SODIUM (PORCINE) 1000 UNIT/ML DIALYSIS
20.0000 [IU]/kg | INTRAMUSCULAR | Status: DC | PRN
  Filled 2018-01-21: qty 1

## 2018-01-21 NOTE — Progress Notes (Signed)
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  She was found to have possible progression of aortic disease, worsening renal failure and volume overload.  Nephrology was consulted and she has been requiring hemodialysis.  Significant Events: 8/31 admit 8/31 venous duplex LUE - subclavian partial thrombosis  9/2 TRH assumed care  9/3 R IJ palindrome cath place by VVS - venogram noting L subclavian vein occlusion   9/5 left arm AV fistula ligation by Dr. Trula Slade  Assessment & Plan: Active Problems:   Hypertensive emergency   Cocaine abuse ([place])   Hypertensive emergency -In the setting of fluid overload due to progressive renal failure, cocaine abuse, medication noncompliance -Currently she is on amlodipine, clonidine, hydralazine, Imdur and labetalol, blood pressure still borderline elevated however much improved  Descending thoracic aortic aneurysm status post graft repair in 2018 -T CTS and vascular have evaluated, no symptoms to suggest acute issues at this time and recommended for now medical treatment with strict blood pressure control  Acute on chronic diastolic CHF -TTE on 08/6960 showed an EF of 55 to 60% and grade 2 diastolic dysfunction, PA P 49mmHg -Fluid management per HD  Acute kidney injury on chronic kidney disease stage V -Now seems to be dialysis dependent, currently undergoing clipping process  Left upper extremity swelling around the site of left arm AV fistula due to left subclavian vein obstruction -Left subclavian vein obstruction per venogram 9/3, she underwent  left arm basilic fistula ligation by Dr. Trula Slade on 01/19/2018 -Swelling much improved  Anemia of renal disease -Stable, no issues  Cocaine abuse -Appears to be the root of her medical issues, discussed with her and she seems determined to quit   Scheduled Meds: . amLODipine  10 mg Oral Daily  . calcitRIOL  0.25 mcg Oral Daily  . Chlorhexidine Gluconate Cloth  6 each Topical Q0600  . cloNIDine  0.1 mg Oral TID  . heparin  5,000 Units Subcutaneous Q8H  . hydrALAZINE  100 mg Oral Q8H  . isosorbide dinitrate  30 mg Oral TID  . labetalol  200 mg Oral BID   Continuous Infusions: . sodium chloride    . sodium chloride     PRN Meds:.sodium chloride, sodium chloride, acetaminophen, alteplase, alteplase, heparin, heparin, labetalol, lidocaine (PF), lidocaine-prilocaine, oxyCODONE-acetaminophen, pentafluoroprop-tetrafluoroeth, pentafluoroprop-tetrafluoroeth   DVT prophylaxis: heparin Code Status: Full code Family Communication: no family at bedside Disposition Plan: home once clipping complete  Consultants:   Nephrology   Vascular  Cardiothoracic surgery   Antimicrobials:  None    Subjective: -She is feeling well this morning, denies any chest pain, no shortness of breath.  Anxious to get the dialysis set up so that she can go home.  Objective: Vitals:   01/20/18 2047 01/21/18 0028 01/21/18 0424 01/21/18 0726  BP: (!) 161/85 126/69 (!) 153/99 (!) 156/88  Pulse: 84 72 88 82  Resp: 18 16    Temp:  98.9 F (37.2 C) 98.1 F (36.7 C) 98 F (36.7 C)  TempSrc:  Oral Oral Oral  SpO2: 100% 97% 99% 100%  Weight:      Height:  Intake/Output Summary (Last 24 hours) at 01/21/2018 1441 Last data filed at 01/21/2018 0957 Gross per 24 hour  Intake -  Output 3750 ml  Net -3750 ml   Filed Weights   01/20/18 0500 01/20/18 1410 01/20/18 1748  Weight: 53.9 kg 53.7 kg 50.5 kg    Examination:  Constitutional: NAD Eyes: No scleral icterus ENMT: Mucous membranes are moist.   Respiratory: clear to auscultation bilaterally, no wheezing, no crackles. Normal respiratory effort. No accessory muscle use.  Cardiovascular: Regular rate and rhythm, no murmurs / rubs / gallops. No LE edema. 2+ pedal pulses. No carotid bruits.  Abdomen: no tenderness. Bowel sounds positive.  Skin: no rashes, lesions, ulcers. No induration Neurologic: CN 2-12 grossly intact. Strength 5/5 in all 4.  Psychiatric: Normal judgment and insight. Alert and oriented x 3. Normal mood.    Data Reviewed: I have independently reviewed following labs and imaging studies   CBC: Recent Labs  Lab 01/17/18 0232 01/18/18 0401 01/19/18 0318 01/20/18 1406 01/21/18 1301  WBC 4.5 4.2 4.5 4.6 4.9  HGB 11.3* 10.1* 11.5* 9.7* 11.2*  HCT 35.2* 31.8* 36.2 32.1* 36.3  MCV 90.0 89.6 88.9 92.2 90.8  PLT 211 157 133* 128* 195*   Basic Metabolic Panel: Recent Labs  Lab 01/15/18 0619  01/17/18 0232 01/18/18 0401 01/19/18 0318 01/20/18 1406 01/21/18 1301  NA 136   < > 139 139 138 139 136  K 3.6   < > 3.9 3.4* 3.5 3.5 4.2  CL 110   < > 106 104 100 97* 99  CO2 17*   < > 20* 26 27 28 26   GLUCOSE 93   < > 97 101* 95 91 128*  BUN 54*   < > 50* 24* 9 16 9   CREATININE 7.68*   < > 7.87* 5.17* 3.24* 4.75* 3.40*  CALCIUM 7.7*   < > 8.2* 7.8* 8.5* 8.1* 8.8*  MG 2.2  --   --   --   --   --   --   PHOS 4.5   < > 5.1* 4.2 3.4 4.6 4.2   < > = values in this interval not displayed.   GFR: Estimated Creatinine Clearance: 19.5 mL/min (A) (by C-G formula based on SCr of 3.4 mg/dL (H)). Liver Function Tests: Recent Labs  Lab 01/17/18 0232 01/18/18 0401 01/19/18 0318 01/20/18 1406 01/21/18 1301  ALBUMIN 3.5 2.9* 3.1* 2.9* 3.2*   No results for input(s): LIPASE, AMYLASE in the last 168 hours. No results for input(s): AMMONIA in the last 168 hours. Coagulation Profile: Recent Labs  Lab 01/19/18 0318  INR 1.12   Cardiac Enzymes: No results for input(s): CKTOTAL, CKMB, CKMBINDEX, TROPONINI in the last 168  hours. BNP (last 3 results) No results for input(s): PROBNP in the last 8760 hours. HbA1C: No results for input(s): HGBA1C in the last 72 hours. CBG: No results for input(s): GLUCAP in the last 168 hours. Lipid Profile: No results for input(s): CHOL, HDL, LDLCALC, TRIG, CHOLHDL, LDLDIRECT in the last 72 hours. Thyroid Function Tests: No results for input(s): TSH, T4TOTAL, FREET4, T3FREE, THYROIDAB in the last 72 hours. Anemia Panel: No results for input(s): VITAMINB12, FOLATE, FERRITIN, TIBC, IRON, RETICCTPCT in the last 72 hours. Urine analysis:    Component Value Date/Time   COLORURINE YELLOW 01/14/2018 0232   APPEARANCEUR CLOUDY (A) 01/14/2018 0232   LABSPEC 1.015 01/14/2018 0232   PHURINE 5.0 01/14/2018 0232   GLUCOSEU NEGATIVE 01/14/2018 0232   HGBUR MODERATE (A) 01/14/2018 0232  BILIRUBINUR NEGATIVE 01/14/2018 [place] 01/14/2018 0232   PROTEINUR >=300 (A) 01/14/2018 0232   NITRITE NEGATIVE 01/14/2018 0232   LEUKOCYTESUR MODERATE (A) 01/14/2018 0232   Sepsis Labs: Invalid input(s): PROCALCITONIN, LACTICIDVEN  Recent Results (from the past 240 hour(s))  MRSA PCR Screening     Status: None   Collection Time: 01/14/18  7:09 AM  Result Value Ref Range Status   MRSA by PCR NEGATIVE NEGATIVE Final    Comment:        The GeneXpert MRSA Assay (FDA approved for NASAL specimens only), is one component of a comprehensive MRSA colonization surveillance program. It is not intended to diagnose MRSA infection nor to guide or monitor treatment for MRSA infections. Performed at [facility], [place] [address], [place], [place] [address]       Radiology Studies: No results found.   Marzetta Board, MD, PhD [facility] Pager [phone]  If 7PM-7AM, please contact night-coverage [email or web address] Password [email or web address] 01/21/2018, 2:41 PM

## 2018-01-21 NOTE — Progress Notes (Signed)
Subjective: Valerie Mathis was seen resting comfortably in her bed this morning. She states that she has been able to move around without any difficulty.   Objective: Vital signs in last 24 hours: Temp:  [97.6 F (36.4 C)-98.9 F (37.2 C)] 98 F (36.7 C) (09/07 0726) Pulse Rate:  [72-92] 82 (09/07 0726) Resp:  [12-19] 16 (09/07 0028) BP: (126-176)/(67-103) 156/88 (09/07 0726) SpO2:  [97 %-100 %] 100 % (09/07 0726) Weight:  [50.5 kg-53.7 kg] 50.5 kg (09/06 1748) Weight change: -0.2 kg  Intake/Output from previous day: 09/06 0701 - 09/07 0700 In: 360 [P.O.:360] Out: 4800 [Urine:1800] Intake/Output this shift: No intake/output data recorded.  Physical Exam  Constitutional: She appears well-developed and well-nourished. No distress.  HENT:  Head: Normocephalic and atraumatic.  Eyes: Conjunctivae are normal.  Cardiovascular: Normal rate, regular rhythm and normal heart sounds.  Respiratory: Effort normal and breath sounds normal. No respiratory distress. She has no wheezes.  GI: Soft. Bowel sounds are normal. She exhibits no distension. There is no tenderness.  Neurological: She is alert.  Skin: She is not diaphoretic. No erythema.  Psychiatric: She has a normal mood and affect. Her behavior is normal. Judgment and thought content normal.     Lab Results: Recent Labs    01/19/18 0318 01/20/18 1406  WBC 4.5 4.6  HGB 11.5* 9.7*  HCT 36.2 32.1*  PLT 133* 128*   BMET:  Recent Labs    01/19/18 0318 01/20/18 1406  NA 138 139  K 3.5 3.5  CL 100 97*  CO2 27 28  GLUCOSE 95 91  BUN 9 16  CREATININE 3.24* 4.75*  CALCIUM 8.5* 8.1*   No results for input(s): PTH in the last 72 hours. Iron Studies: No results for input(s): IRON, TIBC, TRANSFERRIN, FERRITIN in the last 72 hours. Studies/Results: No results found.  I have reviewed the patient's current medications.  Assessment/Plan:  ESRD Got HD yesterday. She may benefit from extra session today due to decrease in renal  function from 4.75 to 3.24. Blood pressure remains stable ranging 150-160s/80-90s. We are continuing to find patient outpatient dialysis placement in [place].    LOS: 7 days   Ica Daye 01/21/2018,7:48 AM

## 2018-01-22 MED ORDER — RENA-VITE PO TABS
1.0000 | ORAL_TABLET | Freq: Every day | ORAL | Status: DC
  Administered 2018-01-22: 1 via ORAL
  Filled 2018-01-22: qty 1

## 2018-01-22 NOTE — Progress Notes (Addendum)
Subjective: Interval History: Resting comfortably. Initially arousable but quickly falls back asleep.   Objective: Vital signs in last 24 hours: Temp:  [97.9 F (36.6 C)-98.8 F (37.1 C)] 98.8 F (37.1 C) (09/08 0108) Pulse Rate:  [73-82] 73 (09/08 0108) Resp:  [15] 15 (09/08 0108) BP: (133-156)/(78-91) 141/91 (09/08 0108) SpO2:  [99 %-100 %] 99 % (09/08 0108) Weight:  [51.1 kg] 51.1 kg (09/08 0300) Weight change: -2.6 kg  Intake/Output from previous day: 09/07 0701 - 09/08 0700 In: -  Out: 1150 [Urine:1150]   Intake/Output this shift: No intake/output data recorded.  General: Well nourished female in no acute distress Pulm: Good air movement with no wheezing or crackles  CV: RRR, no murmurs, no rubs  Extremities: Pulses palpable in all extremities, no LE edema   Lab Results: Recent Labs    01/20/18 1406 01/21/18 1301  WBC 4.6 4.9  HGB 9.7* 11.2*  HCT 32.1* 36.3  PLT 128* 105*   BMET:  Recent Labs    01/20/18 1406 01/21/18 1301  NA 139 136  K 3.5 4.2  CL 97* 99  CO2 28 26  GLUCOSE 91 128*  BUN 16 9  CREATININE 4.75* 3.40*  CALCIUM 8.1* 8.8*   No results for input(s): PTH in the last 72 hours.   Iron Studies: No results for input(s): IRON, TIBC, TRANSFERRIN, FERRITIN in the last 72 hours.   Studies/Results: No results found.  Scheduled: . amLODipine  10 mg Oral Daily  . calcitRIOL  0.25 mcg Oral Daily  . Chlorhexidine Gluconate Cloth  6 each Topical Q0600  . cloNIDine  0.1 mg Oral TID  . heparin  5,000 Units Subcutaneous Q8H  . hydrALAZINE  100 mg Oral Q8H  . isosorbide dinitrate  30 mg Oral TID  . labetalol  200 mg Oral BID   Assessment/Plan:  ESRD. Patient with baseline CKD Stage V presented with hypertensive urgency and progression of her CKD. She is s/p tunnel catheter placement9/3. Last HD session on 9/6. BP has improved with continued HD, would continue other regimen. Labs reviewed, Plts continue to down trend. Watch platelets closely,  ?HIT. Plan for HD tomorrow. Continuing to work on outpatient HD placement in [place], [place].   S/P AVF ligation due to venous hypertension in arm from central vein occlusion  Hypertensive urgency. BP under better control. Continue current regimen.   Polysubstance abuse. + Cocaine use. Discussed with patient. Amendable to treatment programs.   LOS: 8 days   [facility] 01/22/2018,6:09 AM

## 2018-01-22 NOTE — Progress Notes (Signed)
Assumed care of patient at approximately 1330. Patient is alert and oriented resting in bed talking on her cell phone. Patient c/o back pain and requested her prn pain med. Will monitor for effectiveness. VS currently stable.

## 2018-01-22 NOTE — Progress Notes (Signed)
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  She was found to have possible progression of aortic disease, worsening renal failure and volume overload.  Nephrology was consulted and she has been requiring hemodialysis.  Significant Events: 8/31 admit 8/31 venous duplex LUE - subclavian partial thrombosis  9/2 TRH assumed care  9/3 R IJ palindrome cath place by VVS - venogram noting L subclavian vein occlusion   9/5 left arm AV fistula ligation by Dr. Trula Slade  Assessment & Plan: Active Problems:   Hypertensive emergency   Cocaine abuse ([place])   Hypertensive emergency -In the setting of fluid overload due to progressive renal failure, cocaine abuse, medication noncompliance -Currently she is on amlodipine, clonidine, hydralazine, Imdur and labetalol, continue  Descending thoracic aortic aneurysm status post graft repair in 2018 -T CTS and vascular have evaluated, no symptoms to suggest acute issues at this time and recommended for now medical treatment with strict blood pressure control  Acute on chronic diastolic CHF -TTE on 11/4140 showed an EF of 55 to 60% and grade 2 diastolic dysfunction, PA P 56mmHg -Fluid management per HD  Acute kidney injury on chronic kidney disease stage V -Now seems to be dialysis dependent, currently undergoing clipping process  Left upper extremity swelling around the site of left arm AV fistula due to left subclavian vein obstruction -Left subclavian vein obstruction per venogram 9/3, she underwent left arm basilic fistula ligation by Dr. Trula Slade on  01/19/2018 -Swelling much improved  Anemia of renal disease -Stable, no issues  Cocaine abuse -Appears to be the root of her medical issues, discussed with her and she seems determined to quit   Scheduled Meds: . amLODipine  10 mg Oral Daily  . calcitRIOL  0.25 mcg Oral Daily  . Chlorhexidine Gluconate Cloth  6 each Topical Q0600  . cloNIDine  0.1 mg Oral TID  . heparin  5,000 Units Subcutaneous Q8H  . hydrALAZINE  100 mg Oral Q8H  . isosorbide dinitrate  30 mg Oral TID  . labetalol  200 mg Oral BID  . multivitamin  1 tablet Oral QHS   Continuous Infusions: . sodium chloride    . sodium chloride     PRN Meds:.sodium chloride, sodium chloride, acetaminophen, alteplase, alteplase, heparin, heparin, labetalol, lidocaine (PF), lidocaine-prilocaine, oxyCODONE-acetaminophen, pentafluoroprop-tetrafluoroeth, pentafluoroprop-tetrafluoroeth   DVT prophylaxis: heparin Code Status: Full code Family Communication: no family at bedside Disposition Plan: home once clipping complete  Consultants:   Nephrology   Vascular  Cardiothoracic surgery   Antimicrobials:  None    Subjective: -No complaints  Objective: Vitals:   01/22/18 0108 01/22/18 0300 01/22/18 0832 01/22/18 0900  BP: (!) 141/91  (!) 153/95   Pulse: 73   80  Resp: 15  16   Temp: 98.8 F (37.1 C)  98.2 F (36.8 C)   TempSrc: Oral  Oral   SpO2: 99%   99%  Weight:  51.1 kg    Height:        Intake/Output Summary (Last 24 hours) at 01/22/2018 1403 Last data filed at 01/22/2018 0832 Gross per 24 hour  Intake 480 ml  Output 1200 ml  Net -720 ml   Filed Weights   01/20/18 1410 01/20/18 1748 01/22/18 0300  Weight: 53.7 kg 50.5 kg 51.1 kg    Examination:  Constitutional: NAD Respiratory: CTA  Cardiovascular: RRR   Data Reviewed: I have independently reviewed following labs and imaging studies   CBC: Recent Labs  Lab 01/17/18 0232 01/18/18 0401 01/19/18 0318 01/20/18 1406 01/21/18 1301  WBC 4.5 4.2  4.5 4.6 4.9  HGB 11.3* 10.1* 11.5* 9.7* 11.2*  HCT 35.2* 31.8* 36.2 32.1* 36.3  MCV 90.0 89.6 88.9 92.2 90.8  PLT 211 157 133* 128* 676*   Basic Metabolic Panel: Recent Labs  Lab 01/17/18 0232 01/18/18 0401 01/19/18 0318 01/20/18 1406 01/21/18 1301  NA 139 139 138 139 136  K 3.9 3.4* 3.5 3.5 4.2  CL 106 104 100 97* 99  CO2 20* 26 27 28 26   GLUCOSE 97 101* 95 91 128*  BUN 50* 24* 9 16 9   CREATININE 7.87* 5.17* 3.24* 4.75* 3.40*  CALCIUM 8.2* 7.8* 8.5* 8.1* 8.8*  PHOS 5.1* 4.2 3.4 4.6 4.2   GFR: Estimated Creatinine Clearance: 19.7 mL/min (A) (by C-G formula based on SCr of 3.4 mg/dL (H)). Liver Function Tests: Recent Labs  Lab 01/17/18 0232 01/18/18 0401 01/19/18 0318 01/20/18 1406 01/21/18 1301  ALBUMIN 3.5 2.9* 3.1* 2.9* 3.2*   No results for input(s): LIPASE, AMYLASE in the last 168 hours. No results for input(s): AMMONIA in the last 168 hours. Coagulation Profile: Recent Labs  Lab 01/19/18 0318  INR 1.12   Cardiac Enzymes: No results for input(s): CKTOTAL, CKMB, CKMBINDEX, TROPONINI in the last 168 hours. BNP (last 3 results) No results for input(s): PROBNP in the last 8760 hours. HbA1C: No results for input(s): HGBA1C in the last 72 hours. CBG: No results for input(s): GLUCAP in the last 168 hours. Lipid Profile: No results for input(s): CHOL, HDL, LDLCALC, TRIG, CHOLHDL, LDLDIRECT in the last 72 hours. Thyroid Function Tests: No results for input(s): TSH, T4TOTAL, FREET4, T3FREE, THYROIDAB in the last 72 hours. Anemia Panel: No results for input(s): VITAMINB12, FOLATE, FERRITIN, TIBC, IRON, RETICCTPCT in the last 72 hours. Urine analysis:    Component Value Date/Time   COLORURINE YELLOW 01/14/2018 0232   APPEARANCEUR CLOUDY (A) 01/14/2018 0232   LABSPEC 1.015 01/14/2018 0232   PHURINE 5.0 01/14/2018 0232   GLUCOSEU NEGATIVE 01/14/2018 0232   HGBUR MODERATE (A) 01/14/2018 0232   BILIRUBINUR NEGATIVE 01/14/2018 0232   KETONESUR NEGATIVE  01/14/2018 0232   PROTEINUR >=300 (A) 01/14/2018 0232   NITRITE NEGATIVE 01/14/2018 0232   LEUKOCYTESUR MODERATE (A) 01/14/2018 0232   Sepsis Labs: Invalid input(s): PROCALCITONIN, LACTICIDVEN  Recent Results (from the past 240 hour(s))  MRSA PCR Screening     Status: None   Collection Time: 01/14/18  7:09 AM  Result Value Ref Range Status   MRSA by PCR NEGATIVE NEGATIVE Final    Comment:        The GeneXpert MRSA Assay (FDA approved for NASAL specimens only), is one component of a comprehensive MRSA colonization surveillance program. It is not intended to diagnose MRSA infection nor to guide or monitor treatment for MRSA infections. Performed at [facility], [place] [address] [place], [place] [address]       Radiology Studies: No results found.   Marzetta Board, MD, PhD [facility] Pager [phone]  If 7PM-7AM, please contact night-coverage [email or web address] Password [email or web address] 01/22/2018, 2:03 PM

## 2018-01-23 DIAGNOSIS — N186 End stage renal disease: Secondary | ICD-10-CM

## 2018-01-23 DIAGNOSIS — I169 Hypertensive crisis, unspecified: Secondary | ICD-10-CM

## 2018-01-23 LAB — RENAL FUNCTION PANEL
Albumin: 3.1 g/dL — ABNORMAL LOW (ref 3.5–5.0)
Anion gap: 13 (ref 5–15)
BUN: 26 mg/dL — AB (ref 6–20)
CALCIUM: 8.8 mg/dL — AB (ref 8.9–10.3)
CO2: 24 mmol/L (ref 22–32)
CREATININE: 5 mg/dL — AB (ref 0.44–1.00)
Chloride: 100 mmol/L (ref 98–111)
GFR, EST AFRICAN AMERICAN: 12 mL/min — AB (ref >60)
GFR, EST NON AFRICAN AMERICAN: 11 mL/min — AB (ref >60)
Glucose, Bld: 147 mg/dL — ABNORMAL HIGH (ref 70–99)
PHOSPHORUS: 4.8 mg/dL — AB (ref 2.5–4.6)
Potassium: 4.4 mmol/L (ref 3.5–5.1)
SODIUM: 137 mmol/L (ref 135–145)

## 2018-01-23 LAB — CBC
HCT: 33.6 % — ABNORMAL LOW (ref 36.0–46.0)
Hemoglobin: 10.4 g/dL — ABNORMAL LOW (ref 12.0–15.0)
MCH: 28.4 pg (ref 26.0–34.0)
MCHC: 31 g/dL (ref 30.0–36.0)
MCV: 91.8 fL (ref 78.0–100.0)
PLATELETS: 163 10*3/uL (ref 150–400)
RBC: 3.66 MIL/uL — AB (ref 3.87–5.11)
RDW: 15.6 % — AB (ref 11.5–15.5)
WBC: 4.9 10*3/uL (ref 4.0–10.5)

## 2018-01-23 MED ORDER — CLONIDINE HCL 0.2 MG PO TABS: 0.2000 mg | ORAL_TABLET | Freq: Three times a day (TID) | ORAL | 1 refills | Status: DC

## 2018-01-23 MED ORDER — ISOSORBIDE MONONITRATE ER 120 MG PO TB24: 120.0000 mg | ORAL_TABLET | Freq: Every day | ORAL | 1 refills | Status: DC

## 2018-01-23 MED ORDER — AMLODIPINE BESYLATE 10 MG PO TABS: 10.0000 mg | ORAL_TABLET | Freq: Every day | ORAL | 1 refills | Status: DC

## 2018-01-23 MED ORDER — LABETALOL HCL 200 MG PO TABS: 200.0000 mg | ORAL_TABLET | Freq: Two times a day (BID) | ORAL | 1 refills | Status: DC

## 2018-01-23 MED ORDER — HYDRALAZINE HCL 100 MG PO TABS: 100.0000 mg | ORAL_TABLET | Freq: Three times a day (TID) | ORAL | 1 refills | Status: DC

## 2018-01-23 MED ORDER — CALCITRIOL 0.25 MCG PO CAPS: 0.2500 ug | ORAL_CAPSULE | Freq: Every day | ORAL | 1 refills | Status: DC

## 2018-01-23 MED ORDER — SODIUM BICARBONATE 650 MG PO TABS: 650.0000 mg | ORAL_TABLET | Freq: Two times a day (BID) | ORAL | 1 refills | Status: DC

## 2018-01-23 MED ORDER — FERROUS SULFATE 325 (65 FE) MG PO TABS: 325.0000 mg | ORAL_TABLET | Freq: Two times a day (BID) | ORAL | 1 refills | Status: AC

## 2018-01-23 MED ORDER — FERROUS SULFATE 325 (65 FE) MG PO TABS: 325.0000 mg | ORAL_TABLET | Freq: Two times a day (BID) | ORAL | 1 refills | Status: DC

## 2018-01-23 NOTE — Progress Notes (Signed)
Accepted at [facility] .1st treatment [place] at 11:30am  .Schedule is [place] .Chairtime 12:15Pm

## 2018-01-23 NOTE — Progress Notes (Signed)
Patient escorted out of department at this time for discharge. Patient verbalizes understanding of discharge instructions per MD order, and follow up plan for outpatient HD. Patient out of department at this time.

## 2018-01-23 NOTE — Progress Notes (Signed)
Subjective: Interval History: Patient is doing well today. She was able to get some rest overnight. She denies SOB, CP, N/V, abdominal pain. We discussed the plan for HD today and that we are awaiting HD placement in [place], [place]. She voices understanding. All questions and concerns addressed.   Objective: Vital signs in last 24 hours: Temp:  [98.2 F (36.8 C)-98.7 F (37.1 C)] 98.3 F (36.8 C) (09/08 2346) Pulse Rate:  [72-80] 72 (09/08 2346) Resp:  [16-18] 16 (09/08 2346) BP: (131-153)/(75-95) 138/75 (09/08 2346) SpO2:  [97 %-100 %] 97 % (09/08 2346) Weight:  [51.9 kg] 51.9 kg (09/09 0500) Weight change: 0.8 kg  Intake/Output from previous day: 09/08 0701 - 09/09 0700 In: 480 [P.O.:480] Out: 800 [Urine:800]   Intake/Output this shift: No intake/output data recorded.  General: Well nourished female in no acute distress Pulm: Good air movement with no wheezing or crackles  CV: RRR, no murmurs, no rubs  Extremities: Pulses palpable in all extremities, no LE edema   Lab Results: Recent Labs    01/20/18 1406 01/21/18 1301  WBC 4.6 4.9  HGB 9.7* 11.2*  HCT 32.1* 36.3  PLT 128* 105*   BMET:  Recent Labs    01/20/18 1406 01/21/18 1301  NA 139 136  K 3.5 4.2  CL 97* 99  CO2 28 26  GLUCOSE 91 128*  BUN 16 9  CREATININE 4.75* 3.40*  CALCIUM 8.1* 8.8*   No results for input(s): PTH in the last 72 hours.   Iron Studies: No results for input(s): IRON, TIBC, TRANSFERRIN, FERRITIN in the last 72 hours.   Studies/Results: No results found.  Scheduled: . amLODipine  10 mg Oral Daily  . calcitRIOL  0.25 mcg Oral Daily  . Chlorhexidine Gluconate Cloth  6 each Topical Q0600  . cloNIDine  0.1 mg Oral TID  . heparin  5,000 Units Subcutaneous Q8H  . hydrALAZINE  100 mg Oral Q8H  . isosorbide dinitrate  30 mg Oral TID  . labetalol  200 mg Oral BID  . multivitamin  1 tablet Oral QHS   Assessment/Plan:  ESRD. Patient with baseline CKD Stage V presented with  hypertensive urgency and progression of her CKD. She is s/p tunnel catheter placement9/3. Last HD session on 9/6. BP has improved with continued HD. Labs reviewed, Plts continue to down trend. Watch platelets closely, ?HIT. Plan for HD today. Continuing to work on outpatient HD placement in [place], [place].   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 Continue current regimen.   Polysubstance abuse. + Cocaine use. Discussed with patient, she is not interested in any treatment program or groups.   LOS: 9 days   [facility] 01/23/2018,6:29 AM

## 2018-01-23 NOTE — Care Management Note (Addendum)
Case Management Note  Patient Details  Name: Valerie Mathis MRN: [id number] Date of Birth: [birth year]  Subjective/Objective:    patient in process of being clipped per Adela Lank,  She has a place here in [place] in HD is working on getting the paperwork , which she states she will have this done later today and patient should be able to be discharged today.  Patient wanted to go to [facility] but they have no HD beds available, so patient plans to start off getting HD in [place], where she will be staying with her sister, and then later she will transfer to [facility] when they have a HD bed available.  Patient has already used the Match letter on previous admit, so not eligible to use again.  She will need to go to [facility] clinic to get medication ast. She has a follow up apt at the [facility] on 10/2 at 3:10.   Patient has Medicaid pending number per financial counselor- [id number] H.                   Action/Plan: DC home when ready.  Expected Discharge Date:                  Expected Discharge Plan:  Home/Self Care  In-House Referral:     Discharge planning Services  CM Consult  Post Acute Care Choice:    Choice offered to:     DME Arranged:    DME Agency:     HH Arranged:    [place] Agency:     Status of Service:  Completed, signed off  If discussed at [employer] of Stay Meetings, dates discussed:    Additional Comments:  Zenon Mayo, RN 01/23/2018, 12:23 PM

## 2018-01-23 NOTE — Discharge Summary (Signed)
Physician Discharge Summary  Dalaney Needle SWF:[id number] DOB: [birth year] DOA: 01/13/2018  PCP: Patient, No Pcp Per  Admit date: 01/13/2018 Discharge date: 01/23/2018  Admitted From: home Disposition:  home  Recommendations for Outpatient Follow-up:  1. Follow up with PCP in 1-2 weeks 2. 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 Patient states she has been in normal state of health since last discharge on 8/10, in which she had the first surgery for LUE fistula, except for chronic exertional dyspnea.  She reports that she has been out of her lasix and hydralazine for over a week because they were too expensive.  She notes that she has had ongoing swelling of her left arm into her left breast since discharge.  She denies any headache, SOB, chest pain, abdominal pain, fever, syncope, bleeding, oliguria, nausea or vomiting.   Additionally, patient reports recent cocaine use 3 days ago.  On arrival to ER, she was hypertensive at 233/151, otherwise she has been afebrile and stable on room air.  CT renal study noted propagation of her descending thoracic aortic dissection when compared to prior MRI in 11/2017.  CT findings called to Dr. Ricard Dillon with [facility] who will see her later this morning but was not a surgical problem at this time and recommended strict blood pressure management.  She was placed on esmolol gtt in ER and PCCM called for admit.   Hospital Course: Hypertensive emergency -In the setting of fluid overload due to progressive renal failure, cocaine abuse, medication noncompliance. Currently she is on  amlodipine, clonidine, hydralazine, Imdur and labetalol, continue Descending thoracic aortic aneurysm status post graft repair in 2018 -T CTS and vascular have evaluated, no symptoms to suggest acute issues at this time and recommended for now medical treatment with strict blood pressure control Acute on chronic diastolic CHF -TTE on 06/2023 showed an EF of 55 to 60% and grade 2 diastolic dysfunction, PA P 42mmHg. 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 Trula Slade on 01/19/2018. Swelling much improved Anemia of renal disease -Stable, no issues Cocaine abuse -Appears to be the root of her medical issues, discussed with her and she seems determined to quit   Discharge Diagnoses:  Active Problems:   Hypertensive emergency   Cocaine abuse [facility])     Discharge Instructions   Allergies as of 01/23/2018   No Known Allergies     Medication List    STOP taking these medications   furosemide 80 MG tablet Commonly known as:  LASIX     TAKE these medications   amLODipine 10 MG tablet Commonly known as:  NORVASC Take 1 tablet (10 mg total) by mouth daily. for high blood pressure   calcitRIOL 0.25 MCG capsule Commonly known as:  ROCALTROL Take 1 capsule (0.25  mcg total) by mouth daily.   cloNIDine 0.2 MG tablet Commonly known as:  CATAPRES Take 1 tablet (0.2 mg total) by mouth 3 (three) times daily.   ferrous sulfate 325 (65 FE) MG tablet Take 1 tablet (325 mg total) by mouth 2 (two) times daily with a meal.   hydrALAZINE 100 MG tablet Commonly known as:  APRESOLINE Take 1 tablet (100 mg total) by mouth 3 (three) times  daily.   isosorbide mononitrate 120 MG 24 hr tablet Commonly known as:  IMDUR Take 1 tablet (120 mg total) by mouth daily.   labetalol 200 MG tablet Commonly known as:  NORMODYNE Take 1 tablet (200 mg total) by mouth 2 (two) times daily.   sodium bicarbonate 650 MG tablet Take 1 tablet (650 mg total) by mouth 2 (two) times daily.      Follow-up Information    [place] Follow up on 02/15/2018.   Why:  3:10 for hospital follow up Contact information: [place] [address] [facility] [place] Follow up.   Why:  you can utilize this pharmacy for medication assitance. Contact information: [address] [place] [address] [phone]       Rosetta Posner, MD In 4 weeks.   Specialties:  Vascular Surgery, Cardiology Why:  Office will call you to arrange your appt (sent) Contact information: [address] [place] [address] [phone]           Consultations:  Nephrology   Vascular  TCV  Procedures/Studies: 8/31 admit 8/31 venous duplex LUE - subclavian partial thrombosis  9/2 TRH assumed care  9/3 R IJ palindrome cath place by VVS - venogram noting L subclavian vein occlusion  9/5 left arm AV fistula ligation by Dr. Trula Slade  Ct Renal Stone Study  Result Date: 01/14/2018 CLINICAL DATA:  RIGHT back pain, hypertensive. History of end-stage renal disease, type-B aortic dissection, status post repair 2018, hypertension, substance abuse. EXAM: CT ABDOMEN AND PELVIS WITHOUT CONTRAST TECHNIQUE: Multidetector CT imaging of the abdomen and pelvis was performed following the standard protocol without IV contrast. COMPARISON:  MRA abdomen November 29, 2017 FINDINGS: LOWER CHEST: Moderate cardiomegaly. No pericardial effusion. Moderate RIGHT and small LEFT pleural effusions. Bilateral lower lobe atelectasis. HEPATOBILIARY: Hepatomegaly.  Subcentimeter gallstones without CT findings of acute cholecystitis. PANCREAS: Normal. SPLEEN: Normal. ADRENALS/URINARY TRACT: Kidneys are orthotopic, mildly atrophic. No nephrolithiasis, hydronephrosis; limited assessment for renal masses by nonenhanced CT. The unopacified ureters are normal in course and caliber. Urinary bladder is decompressed and unremarkable. Normal adrenal glands. STOMACH/BOWEL: The stomach, small and large bowel are normal in course and caliber without inflammatory changes, sensitivity decreased by lack of enteric contrast. Normal appendix. VASCULAR/LYMPHATIC: Aortoiliac vessels are normal in course and caliber. Crescentic density descending thoracic aorta are consistent with dissection. REPRODUCTIVE: Normal. OTHER: Small to moderate volume ascites. No intraperitoneal free air MUSCULOSKELETAL: Anasarca. Status post median sternotomy. Paraumbilical scarring. Diffusely sclerotic axial skeleton most compatible with renal osteodystrophy. IMPRESSION: 1. Descending thoracic aortic dissection with apparent propagation from prior MRI given differences in imaging technique. 2. Cardiomegaly. Moderate RIGHT and small LEFT pleural effusions with bibasilar atelectasis. 3. Small to moderate volume ascites.  Anasarca. 4. Hepatomegaly. 5. Acute findings discussed with and reconfirmed by Dr.DOUGLAS DELO on 01/14/2018 at 4:38 am. Aortic Atherosclerosis (ICD10-I70.0). Electronically Signed   By: Elon Alas M.D.   On: 01/14/2018 04:38  Subjective: - no chest pain, shortness of breath, no abdominal pain, nausea or vomiting.    Discharge Exam: Vitals:   01/23/18 1631 01/23/18 1746  BP: 120/74 120/82  Pulse: 83 84  Resp: 18 18  Temp: 98.1 F (36.7 C) 99.3 F (37.4 C)  SpO2: 99% 99%    General: Pt is alert, awake, not in acute distress Cardiovascular: RRR, S1/S2 +, no rubs, no gallops Respiratory: CTA bilaterally, no wheezing, no rhonchi Abdominal: Soft, NT, ND, bowel sounds  + Extremities: no edema, no cyanosis    The results of significant diagnostics from this hospitalization (including imaging, microbiology, ancillary and laboratory) are listed below for reference.     Microbiology: Recent Results (from the past 240 hour(s))  MRSA PCR Screening     Status: None   Collection Time: 01/14/18  7:09 AM  Result Value Ref Range Status   MRSA by PCR NEGATIVE NEGATIVE Final    Comment:        The GeneXpert MRSA Assay (FDA approved for NASAL specimens only), is one component of a comprehensive MRSA colonization surveillance program. It is not intended to diagnose MRSA infection nor to guide or monitor treatment for MRSA infections. Performed at [facility], [place] [address] [place], [place] [address]      Labs: BNP (last 3 results) Recent Labs    09/12/17 1531  BNP >7,371.0*   Basic Metabolic Panel: Recent Labs  Lab 01/18/18 0401 01/19/18 0318 01/20/18 1406 01/21/18 1301 01/23/18 1304  NA 139 138 139 136 137  K 3.4* 3.5 3.5 4.2 4.4  CL 104 100 97* 99 100  CO2 26 27 28 26 24   GLUCOSE 101* 95 91 128* 147*  BUN 24* 9 16 9  26*  CREATININE 5.17* 3.24* 4.75* 3.40* 5.00*  CALCIUM 7.8* 8.5* 8.1* 8.8* 8.8*  PHOS 4.2 3.4 4.6 4.2 4.8*   Liver Function Tests: Recent Labs  Lab 01/18/18 0401 01/19/18 0318 01/20/18 1406 01/21/18 1301 01/23/18 1304  ALBUMIN 2.9* 3.1* 2.9* 3.2* 3.1*   No results for input(s): LIPASE, AMYLASE in the last 168 hours. No results for input(s): AMMONIA in the last 168 hours. CBC: Recent Labs  Lab 01/18/18 0401 01/19/18 0318 01/20/18 1406 01/21/18 1301 01/23/18 1304  WBC 4.2 4.5 4.6 4.9 4.9  HGB 10.1* 11.5* 9.7* 11.2* 10.4*  HCT 31.8* 36.2 32.1* 36.3 33.6*  MCV 89.6 88.9 92.2 90.8 91.8  PLT 157 133* 128* 105* 163   Cardiac Enzymes: No results for input(s): CKTOTAL, CKMB, CKMBINDEX, TROPONINI in the last 168 hours. BNP: Invalid input(s): POCBNP CBG: No results for input(s): GLUCAP in the last 168  hours. D-Dimer No results for input(s): DDIMER in the last 72 hours. Hgb A1c No results for input(s): HGBA1C in the last 72 hours. Lipid Profile No results for input(s): CHOL, HDL, LDLCALC, TRIG, CHOLHDL, LDLDIRECT in the last 72 hours. Thyroid function studies No results for input(s): TSH, T4TOTAL, T3FREE, THYROIDAB in the last 72 hours.  Invalid input(s): FREET3 Anemia work up No results for input(s): VITAMINB12, FOLATE, FERRITIN, TIBC, IRON, RETICCTPCT in the last 72 hours. Urinalysis    Component Value Date/Time   COLORURINE YELLOW 01/14/2018 0232   APPEARANCEUR CLOUDY (A) 01/14/2018 0232   LABSPEC 1.015 01/14/2018 0232   PHURINE 5.0 01/14/2018 0232   GLUCOSEU NEGATIVE 01/14/2018 0232   HGBUR MODERATE (A) 01/14/2018 0232   BILIRUBINUR NEGATIVE 01/14/2018 0232   KETONESUR NEGATIVE 01/14/2018 0232   PROTEINUR >=300 (A) 01/14/2018 0232   NITRITE NEGATIVE 01/14/2018  [facility] (A) 01/14/2018 0232   Sepsis Labs Invalid input(s): PROCALCITONIN,  WBC,  LACTICIDVEN   Time coordinating discharge: 35 minutes  SIGNED:  Marzetta Board, MD  [facility] 01/23/2018, 5:57 PM Pager [phone]  If 7PM-7AM, please contact night-coverage [email or web address] Password [email or web address]

## 2018-01-23 NOTE — Discharge Instructions (Signed)
Follow with Patient, No Pcp Per in 5-7 days  Please get a complete blood count and chemistry panel checked by your Primary MD at your next visit, and again as instructed by your Primary MD. Please get your medications reviewed and adjusted by your Primary MD.  Please request your Primary MD to go over all Hospital Tests and Procedure/Radiological results at the follow up, please get all Hospital records sent to your Prim MD by signing hospital release before you go home.  If you had Pneumonia of Lung problems at the Hospital: Please get a 2 view Chest X ray done in 6-8 weeks after hospital discharge or sooner if instructed by your Primary MD.  If you have Congestive Heart Failure: Please call your Cardiologist or Primary MD anytime you have any of the following symptoms:  1) 3 pound weight gain in 24 hours or 5 pounds in 1 week  2) shortness of breath, with or without a dry hacking cough  3) swelling in the hands, feet or stomach  4) if you have to sleep on extra pillows at night in order to breathe  Follow cardiac low salt diet and 1.5 lit/day fluid restriction.  If you have diabetes Accuchecks 4 times/day, Once in AM empty stomach and then before each meal. Log in all results and show them to your primary doctor at your next visit. If any glucose reading is under 80 or above 300 call your primary MD immediately.  If you have Seizure/Convulsions/Epilepsy: Please do not drive, operate heavy machinery, participate in activities at heights or participate in high speed sports until you have seen by Primary MD or a Neurologist and advised to do so again.  If you had Gastrointestinal Bleeding: Please ask your Primary MD to check a complete blood count within one week of discharge or at your next visit. Your endoscopic/colonoscopic biopsies that are pending at the time of discharge, will also need to followed by your Primary MD.  Get Medicines reviewed and adjusted. Please take all your  medications with you for your next visit with your Primary MD  Please request your Primary MD to go over all hospital tests and procedure/radiological results at the follow up, please ask your Primary MD to get all Hospital records sent to his/her office.  If you experience worsening of your admission symptoms, develop shortness of breath, life threatening emergency, suicidal or homicidal thoughts you must seek medical attention immediately by calling 911 or calling your MD immediately  if symptoms less severe.  You must read complete instructions/literature along with all the possible adverse reactions/side effects for all the Medicines you take and that have been prescribed to you. Take any new Medicines after you have completely understood and accpet all the possible adverse reactions/side effects.   Do not drive or operate heavy machinery when taking Pain medications.   Do not take more than prescribed Pain, Sleep and Anxiety Medications  Special Instructions: If you have smoked or chewed Tobacco  in the last 2 yrs please stop smoking, stop any regular Alcohol  and or any Recreational drug use.  Wear Seat belts while driving.  Please note You were cared for by a hospitalist during your hospital stay. If you have any questions about your discharge medications or the care you received while you were in the hospital after you are discharged, you can call the unit and asked to speak with the hospitalist on call if the hospitalist that took care of you is not available.  Once you are discharged, your primary care physician will handle any further medical issues. Please note that NO REFILLS for any discharge medications will be authorized once you are discharged, as it is imperative that you return to your primary care physician (or establish a relationship with a primary care physician if you do not have one) for your aftercare needs so that they can reassess your need for medications and monitor your  lab values.  You can reach the hospitalist office at phone [phone] or fax [phone]   If you do not have a primary care physician, you can call [phone] for a physician referral.  Activity: As tolerated with Full fall precautions use walker/cane & assistance as needed  Diet: renal  Disposition Home  Dialysis at [facility] 10,2019 at 11:30am  .Schedule is [place] .[place] 12:15Pm

## 2018-01-23 NOTE — Progress Notes (Deleted)
PROGRESS NOTE  Valerie Mathis ZTI:[id number] DOB: 1986-04-10 DOA: 01/13/2018 PCP: Patient, No Pcp Per   LOS: 9 days   Brief Narrative / Interim history: 32 year old female with history of type B aortic dissection status post graft repair in 2018 in [place], chronic diastolic CHF, chronic kidney disease stage IV/V, hypertension, cocaine and tobacco abuse, TTP and limited medical endurance who presented to the ER with sudden onset right flank pain.  She was found to have possible progression of aortic disease, worsening renal failure and volume overload.  Nephrology was consulted and she has been requiring hemodialysis.  Significant Events: 8/31 admit 8/31 venous duplex LUE - subclavian partial thrombosis  9/2 TRH assumed care  9/3 R IJ palindrome cath place by VVS - venogram noting L subclavian vein occlusion   9/5 left arm AV fistula ligation by Dr. Trula Slade  Assessment & Plan: Active Problems:   Hypertensive emergency   Cocaine abuse ([place])   Hypertensive emergency -In the setting of fluid overload due to progressive renal failure, cocaine abuse, medication noncompliance -Currently she is on amlodipine, clonidine, hydralazine, Imdur and labetalol, continue  Descending thoracic aortic aneurysm status post graft repair in 2018 -T CTS and vascular have evaluated, no symptoms to suggest acute issues at this time and recommended for now medical treatment with strict blood pressure control  Acute on chronic diastolic CHF -TTE on 12/2503 showed an EF of 55 to 60% and grade 2 diastolic dysfunction, PA P 71mmHg -Fluid management per HD  Acute kidney injury on chronic kidney disease stage V -Now seems to be dialysis dependent, currently undergoing clipping process  Left upper extremity swelling around the site of left arm AV fistula due to left subclavian vein obstruction -Left subclavian vein obstruction per venogram 9/3, she underwent left arm basilic fistula ligation by Dr. Trula Slade on  01/19/2018 -Swelling much improved  Anemia of renal disease -Stable, no issues  Cocaine abuse -Appears to be the root of her medical issues, discussed with her and she seems determined to quit   Scheduled Meds: . amLODipine  10 mg Oral Daily  . calcitRIOL  0.25 mcg Oral Daily  . Chlorhexidine Gluconate Cloth  6 each Topical Q0600  . cloNIDine  0.1 mg Oral TID  . heparin  5,000 Units Subcutaneous Q8H  . hydrALAZINE  100 mg Oral Q8H  . isosorbide dinitrate  30 mg Oral TID  . labetalol  200 mg Oral BID  . multivitamin  1 tablet Oral QHS   Continuous Infusions: . sodium chloride    . sodium chloride     PRN Meds:.sodium chloride, sodium chloride, acetaminophen, alteplase, alteplase, heparin, heparin, labetalol, lidocaine (PF), lidocaine-prilocaine, oxyCODONE-acetaminophen, pentafluoroprop-tetrafluoroeth, pentafluoroprop-tetrafluoroeth   DVT prophylaxis: heparin Code Status: Full code Family Communication: no family at bedside Disposition Plan: home once clipping complete  Consultants:   Nephrology   Vascular  Cardiothoracic surgery   Antimicrobials:  None    Subjective: -awaiting d/c, no complaints  Objective: Vitals:   01/22/18 1524 01/22/18 2346 01/23/18 0500 01/23/18 0721  BP: 131/75 138/75  (!) 143/85  Pulse: 75 72  70  Resp: 18 16  18   Temp: 98.7 F (37.1 C) 98.3 F (36.8 C)  (!) 97.5 F (36.4 C)  TempSrc: Oral Oral  Oral  SpO2: 100% 97%  99%  Weight:   51.9 kg   Height:       No intake or output data in the 24 hours ending 01/23/18 1149 Filed Weights   01/20/18 1748 01/22/18 0300 01/23/18 0500  Weight: 50.5 kg 51.1 kg 51.9 kg    Examination:  Constitutional: NAD Respiratory: CTA Cardiovascular: RRR  Data Reviewed: I have independently reviewed following labs and imaging studies   CBC: Recent Labs  Lab 01/17/18 0232 01/18/18 0401 01/19/18 0318 01/20/18 1406 01/21/18 1301  WBC 4.5 4.2 4.5 4.6 4.9  HGB 11.3* 10.1* 11.5* 9.7* 11.2*    HCT 35.2* 31.8* 36.2 32.1* 36.3  MCV 90.0 89.6 88.9 92.2 90.8  PLT 211 157 133* 128* 756*   Basic Metabolic Panel: Recent Labs  Lab 01/17/18 0232 01/18/18 0401 01/19/18 0318 01/20/18 1406 01/21/18 1301  NA 139 139 138 139 136  K 3.9 3.4* 3.5 3.5 4.2  CL 106 104 100 97* 99  CO2 20* 26 27 28 26   GLUCOSE 97 101* 95 91 128*  BUN 50* 24* 9 16 9   CREATININE 7.87* 5.17* 3.24* 4.75* 3.40*  CALCIUM 8.2* 7.8* 8.5* 8.1* 8.8*  PHOS 5.1* 4.2 3.4 4.6 4.2   GFR: Estimated Creatinine Clearance: 20 mL/min (A) (by C-G formula based on SCr of 3.4 mg/dL (H)). Liver Function Tests: Recent Labs  Lab 01/17/18 0232 01/18/18 0401 01/19/18 0318 01/20/18 1406 01/21/18 1301  ALBUMIN 3.5 2.9* 3.1* 2.9* 3.2*   No results for input(s): LIPASE, AMYLASE in the last 168 hours. No results for input(s): AMMONIA in the last 168 hours. Coagulation Profile: Recent Labs  Lab 01/19/18 0318  INR 1.12   Cardiac Enzymes: No results for input(s): CKTOTAL, CKMB, CKMBINDEX, TROPONINI in the last 168 hours. BNP (last 3 results) No results for input(s): PROBNP in the last 8760 hours. HbA1C: No results for input(s): HGBA1C in the last 72 hours. CBG: No results for input(s): GLUCAP in the last 168 hours. Lipid Profile: No results for input(s): CHOL, HDL, LDLCALC, TRIG, CHOLHDL, LDLDIRECT in the last 72 hours. Thyroid Function Tests: No results for input(s): TSH, T4TOTAL, FREET4, T3FREE, THYROIDAB in the last 72 hours. Anemia Panel: No results for input(s): VITAMINB12, FOLATE, FERRITIN, TIBC, IRON, RETICCTPCT in the last 72 hours. Urine analysis:    Component Value Date/Time   COLORURINE YELLOW 01/14/2018 0232   APPEARANCEUR CLOUDY (A) 01/14/2018 0232   LABSPEC 1.015 01/14/2018 0232   PHURINE 5.0 01/14/2018 0232   GLUCOSEU NEGATIVE 01/14/2018 0232   HGBUR MODERATE (A) 01/14/2018 0232   BILIRUBINUR NEGATIVE 01/14/2018 0232   KETONESUR NEGATIVE 01/14/2018 0232   PROTEINUR >=300 (A) 01/14/2018 0232    NITRITE NEGATIVE 01/14/2018 0232   LEUKOCYTESUR MODERATE (A) 01/14/2018 0232   Sepsis Labs: Invalid input(s): PROCALCITONIN, LACTICIDVEN  Recent Results (from the past 240 hour(s))  MRSA PCR Screening     Status: None   Collection Time: 01/14/18  7:09 AM  Result Value Ref Range Status   MRSA by PCR NEGATIVE NEGATIVE Final    Comment:        The GeneXpert MRSA Assay (FDA approved for NASAL specimens only), is one component of a comprehensive MRSA colonization surveillance program. It is not intended to diagnose MRSA infection nor to guide or monitor treatment for MRSA infections. Performed at [facility], [place] [address] [place], [place] [address]       Radiology Studies: No results found.   Marzetta Board, MD, PhD [facility] Pager [phone]  If 7PM-7AM, please contact night-coverage [email or web address] Password [email or web address] 01/23/2018, 11:49 AM

## 2018-01-24 ENCOUNTER — Inpatient Hospital Stay (INDEPENDENT_AMBULATORY_CARE_PROVIDER_SITE_OTHER): Payer: Self-pay | Admitting: Physician Assistant

## 2018-02-02 ENCOUNTER — Encounter (HOSPITAL_COMMUNITY): Payer: Self-pay

## 2018-02-14 ENCOUNTER — Encounter: Payer: Self-pay | Admitting: Vascular Surgery

## 2018-02-14 ENCOUNTER — Other Ambulatory Visit: Payer: Self-pay | Admitting: *Deleted

## 2018-02-14 ENCOUNTER — Encounter: Payer: Self-pay | Admitting: *Deleted

## 2018-02-14 ENCOUNTER — Ambulatory Visit (INDEPENDENT_AMBULATORY_CARE_PROVIDER_SITE_OTHER): Payer: Self-pay | Admitting: Vascular Surgery

## 2018-02-14 VITALS — BP 196/124 | HR 76 | Temp 97.9°F | Resp 18 | Ht 63.0 in | Wt 110.0 lb

## 2018-02-14 DIAGNOSIS — N186 End stage renal disease: Secondary | ICD-10-CM

## 2018-02-14 DIAGNOSIS — Z992 Dependence on renal dialysis: Secondary | ICD-10-CM

## 2018-02-14 NOTE — Progress Notes (Signed)
   Patient name: Valerie Mathis MRN: [id number] DOB: 1986-01-08 Sex: female  REASON FOR VISIT: Discuss access for hemodialysis  HPI: Valerie Mathis is a 32 y.o. female in today for discussion of access for hemodialysis.  She has a very complicated past history.  She had emergent cardiac surgery in [facility] for a sending arch dissection.  Had subsequent renal failure.  Also has descending thoracic dissection.  She had placement of a left arm fistula and had complication of a very swollen arm.  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  The fistula was ligated and the right IJ catheter was placed.  She is here today for further discussion.  She has no history of right arm swelling.  Her left arm has returned to normal baseline  Current Outpatient Medications  Medication Sig Dispense Refill  . amLODipine (NORVASC) 10 MG tablet Take 1 tablet (10 mg total) by mouth daily. for high blood pressure 30 tablet 1  . calcitRIOL (ROCALTROL) 0.25 MCG capsule Take 1 capsule (0.25 mcg total) by mouth daily. 30 capsule 1  . cloNIDine (CATAPRES) 0.2 MG tablet Take 1 tablet (0.2 mg total) by mouth 3 (three) times daily. 90 tablet 1  . ferrous sulfate 325 (65 FE) MG tablet Take 1 tablet (325 mg total) by mouth 2 (two) times daily with a meal. 60 tablet 1  . hydrALAZINE (APRESOLINE) 100 MG tablet Take 1 tablet (100 mg total) by mouth 3 (three) times daily. 90 tablet 1  . isosorbide mononitrate (IMDUR) 120 MG 24 hr tablet Take 1 tablet (120 mg total) by mouth daily. 30 tablet 1  . labetalol (NORMODYNE) 200 MG tablet Take 1 tablet (200 mg total) by mouth 2 (two) times daily. 60 tablet 1  . sodium bicarbonate 650 MG tablet Take 1 tablet (650 mg total) by mouth 2 (two) times daily. 60 tablet 1   No current facility-administered medications for this visit.      PHYSICAL EXAM: Vitals:   02/14/18 1239  BP: (!) 196/124  Pulse: 76  Resp: 18    Temp: 97.9 F (36.6 C)  TempSrc: Oral  SpO2: 100%  Weight: 110 lb (49.9 kg)  Height: 5\' 3"  (1.6 m)    GENERAL: The patient is a well-nourished female, in no acute distress. The vital signs are documented above. Left radial pulses bilaterally.  Small surface veins bilaterally  I imaged her right arm with SonoSite ultrasound.  This shows an usable cephalic vein throughout its course.  She does have a moderate size cephalic vein from the antecubital space proximally.  It is a duplicated vein in the distal upper arm and then becomes a more dominant single vein above the elbow.  MEDICAL ISSUES: Discussed options with the patient.  Have recommended attempt at right arm AV fistula.  Explained that this would require 2 stages that would see back in the office after the initial brachial artery to basilic vein fistula and if she does have adequate maturation would then do the second stage transposition.  She currently has hemodialysis on Tuesday Thursday and Saturday and so therefore we will coordinate this on a nondialysis day at [facility]   Rosetta Posner, MD [facility] Tel [phone] Pager [phone]

## 2018-02-15 ENCOUNTER — Inpatient Hospital Stay (INDEPENDENT_AMBULATORY_CARE_PROVIDER_SITE_OTHER): Payer: Self-pay | Admitting: Physician Assistant

## 2018-02-17 ENCOUNTER — Encounter (HOSPITAL_COMMUNITY): Payer: Self-pay | Admitting: *Deleted

## 2018-02-17 ENCOUNTER — Emergency Department (HOSPITAL_COMMUNITY)
Admission: EM | Admit: 2018-02-17 | Discharge: 2018-02-18 | Discharge status: Against Medical Advice | Payer: Medicaid Other | Source: Home / Self Care

## 2018-02-17 ENCOUNTER — Emergency Department (HOSPITAL_COMMUNITY): Payer: Medicaid Other

## 2018-02-17 ENCOUNTER — Other Ambulatory Visit: Payer: Self-pay

## 2018-02-17 ENCOUNTER — Encounter (HOSPITAL_COMMUNITY): Payer: Self-pay | Admitting: Emergency Medicine

## 2018-02-17 DIAGNOSIS — Z9889 Other specified postprocedural states: Secondary | ICD-10-CM | POA: Diagnosis not present

## 2018-02-17 DIAGNOSIS — R7989 Other specified abnormal findings of blood chemistry: Secondary | ICD-10-CM | POA: Diagnosis not present

## 2018-02-17 DIAGNOSIS — D631 Anemia in chronic kidney disease: Secondary | ICD-10-CM | POA: Diagnosis not present

## 2018-02-17 DIAGNOSIS — I160 Hypertensive urgency: Secondary | ICD-10-CM | POA: Diagnosis not present

## 2018-02-17 DIAGNOSIS — I499 Cardiac arrhythmia, unspecified: Secondary | ICD-10-CM | POA: Diagnosis not present

## 2018-02-17 DIAGNOSIS — Z9114 Patient's other noncompliance with medication regimen: Secondary | ICD-10-CM | POA: Diagnosis not present

## 2018-02-17 DIAGNOSIS — K8020 Calculus of gallbladder without cholecystitis without obstruction: Secondary | ICD-10-CM | POA: Diagnosis not present

## 2018-02-17 DIAGNOSIS — I071 Rheumatic tricuspid insufficiency: Secondary | ICD-10-CM | POA: Diagnosis not present

## 2018-02-17 DIAGNOSIS — I132 Hypertensive heart and chronic kidney disease with heart failure and with stage 5 chronic kidney disease, or end stage renal disease: Secondary | ICD-10-CM | POA: Diagnosis not present

## 2018-02-17 DIAGNOSIS — Z8249 Family history of ischemic heart disease and other diseases of the circulatory system: Secondary | ICD-10-CM | POA: Diagnosis not present

## 2018-02-17 DIAGNOSIS — Z86718 Personal history of other venous thrombosis and embolism: Secondary | ICD-10-CM | POA: Diagnosis not present

## 2018-02-17 DIAGNOSIS — R079 Chest pain, unspecified: Secondary | ICD-10-CM | POA: Insufficient documentation

## 2018-02-17 DIAGNOSIS — Z79899 Other long term (current) drug therapy: Secondary | ICD-10-CM | POA: Diagnosis not present

## 2018-02-17 DIAGNOSIS — Z87891 Personal history of nicotine dependence: Secondary | ICD-10-CM | POA: Diagnosis not present

## 2018-02-17 DIAGNOSIS — I5032 Chronic diastolic (congestive) heart failure: Secondary | ICD-10-CM | POA: Diagnosis not present

## 2018-02-17 DIAGNOSIS — F1911 Other psychoactive substance abuse, in remission: Secondary | ICD-10-CM | POA: Diagnosis not present

## 2018-02-17 DIAGNOSIS — Z5321 Procedure and treatment not carried out due to patient leaving prior to being seen by health care provider: Secondary | ICD-10-CM | POA: Insufficient documentation

## 2018-02-17 DIAGNOSIS — I7101 Dissection of thoracic aorta: Secondary | ICD-10-CM | POA: Diagnosis not present

## 2018-02-17 DIAGNOSIS — N179 Acute kidney failure, unspecified: Secondary | ICD-10-CM | POA: Diagnosis not present

## 2018-02-17 DIAGNOSIS — N186 End stage renal disease: Secondary | ICD-10-CM | POA: Diagnosis not present

## 2018-02-17 DIAGNOSIS — F1410 Cocaine abuse, uncomplicated: Secondary | ICD-10-CM | POA: Diagnosis not present

## 2018-02-17 DIAGNOSIS — F1011 Alcohol abuse, in remission: Secondary | ICD-10-CM | POA: Diagnosis not present

## 2018-02-17 DIAGNOSIS — Z992 Dependence on renal dialysis: Secondary | ICD-10-CM | POA: Diagnosis not present

## 2018-02-17 LAB — BASIC METABOLIC PANEL
Anion gap: 15 (ref 5–15)
BUN: 30 mg/dL — AB (ref 6–20)
CO2: 20 mmol/L — ABNORMAL LOW (ref 22–32)
Calcium: 8.5 mg/dL — ABNORMAL LOW (ref 8.9–10.3)
Chloride: 102 mmol/L (ref 98–111)
Creatinine, Ser: 4.23 mg/dL — ABNORMAL HIGH (ref 0.44–1.00)
GFR calc Af Amer: 15 mL/min — ABNORMAL LOW (ref >60)
GFR, EST NON AFRICAN AMERICAN: 13 mL/min — AB (ref >60)
GLUCOSE: 105 mg/dL — AB (ref 70–99)
Potassium: 4.2 mmol/L (ref 3.5–5.1)
Sodium: 137 mmol/L (ref 135–145)

## 2018-02-17 LAB — I-STAT BETA HCG BLOOD, ED (MC, WL, AP ONLY)

## 2018-02-17 LAB — CBC
HEMATOCRIT: 33.6 % — AB (ref 36.0–46.0)
Hemoglobin: 10.5 g/dL — ABNORMAL LOW (ref 12.0–15.0)
MCH: 27.4 pg (ref 26.0–34.0)
MCHC: 31.3 g/dL (ref 30.0–36.0)
MCV: 87.7 fL (ref 78.0–100.0)
Platelets: 186 10*3/uL (ref 150–400)
RBC: 3.83 MIL/uL — ABNORMAL LOW (ref 3.87–5.11)
RDW: 16 % — ABNORMAL HIGH (ref 11.5–15.5)
WBC: 8.5 10*3/uL (ref 4.0–10.5)

## 2018-02-17 LAB — I-STAT TROPONIN, ED: TROPONIN I, POC: 0.02 ng/mL (ref 0.00–0.08)

## 2018-02-17 NOTE — ED Triage Notes (Addendum)
C/o pain across upper chest and SOB with exertion x 10 min.  States pain started while walking.  Pt has R chest PORT.  Denies nausea and vomiting.

## 2018-02-17 NOTE — Progress Notes (Signed)
Pt denies SOB, chest pain, and being under the care of a cardiologist. Pt denies having a stress test and cardiac cath. Pt made aware to stop taking vitamins, fish oil and herbal medications. Do not take any NSAIDs ie: Ibuprofen, Advil, Naproxen (Aleve), Motrin, BC and Goody Powder. Pt verbalized understanding of all pre-op instructions. Anesthesia asked to review pt history.

## 2018-02-18 ENCOUNTER — Emergency Department (HOSPITAL_COMMUNITY): Payer: Medicaid Other

## 2018-02-18 ENCOUNTER — Encounter (HOSPITAL_COMMUNITY): Payer: Self-pay | Admitting: Emergency Medicine

## 2018-02-18 ENCOUNTER — Observation Stay (HOSPITAL_COMMUNITY)
Admission: EM | Admit: 2018-02-18 | Discharge: 2018-02-21 | Disposition: A | Discharge status: Home / Self Care | Payer: Medicaid Other | Attending: Internal Medicine | Admitting: Internal Medicine

## 2018-02-18 DIAGNOSIS — I160 Hypertensive urgency: Secondary | ICD-10-CM | POA: Diagnosis not present

## 2018-02-18 DIAGNOSIS — F1011 Alcohol abuse, in remission: Secondary | ICD-10-CM | POA: Insufficient documentation

## 2018-02-18 DIAGNOSIS — Z86718 Personal history of other venous thrombosis and embolism: Secondary | ICD-10-CM | POA: Insufficient documentation

## 2018-02-18 DIAGNOSIS — I071 Rheumatic tricuspid insufficiency: Secondary | ICD-10-CM | POA: Insufficient documentation

## 2018-02-18 DIAGNOSIS — F1410 Cocaine abuse, uncomplicated: Secondary | ICD-10-CM | POA: Insufficient documentation

## 2018-02-18 DIAGNOSIS — Z8679 Personal history of other diseases of the circulatory system: Secondary | ICD-10-CM

## 2018-02-18 DIAGNOSIS — R778 Other specified abnormalities of plasma proteins: Secondary | ICD-10-CM

## 2018-02-18 DIAGNOSIS — I132 Hypertensive heart and chronic kidney disease with heart failure and with stage 5 chronic kidney disease, or end stage renal disease: Secondary | ICD-10-CM | POA: Diagnosis not present

## 2018-02-18 DIAGNOSIS — Z9889 Other specified postprocedural states: Secondary | ICD-10-CM | POA: Insufficient documentation

## 2018-02-18 DIAGNOSIS — K8020 Calculus of gallbladder without cholecystitis without obstruction: Secondary | ICD-10-CM | POA: Insufficient documentation

## 2018-02-18 DIAGNOSIS — D649 Anemia, unspecified: Secondary | ICD-10-CM

## 2018-02-18 DIAGNOSIS — D631 Anemia in chronic kidney disease: Secondary | ICD-10-CM | POA: Insufficient documentation

## 2018-02-18 DIAGNOSIS — I71019 Dissection of thoracic aorta, unspecified: Secondary | ICD-10-CM

## 2018-02-18 DIAGNOSIS — I7101 Dissection of thoracic aorta: Secondary | ICD-10-CM | POA: Insufficient documentation

## 2018-02-18 DIAGNOSIS — R079 Chest pain, unspecified: Secondary | ICD-10-CM

## 2018-02-18 DIAGNOSIS — I499 Cardiac arrhythmia, unspecified: Secondary | ICD-10-CM | POA: Insufficient documentation

## 2018-02-18 DIAGNOSIS — N186 End stage renal disease: Secondary | ICD-10-CM | POA: Insufficient documentation

## 2018-02-18 DIAGNOSIS — Z9114 Patient's other noncompliance with medication regimen: Secondary | ICD-10-CM | POA: Insufficient documentation

## 2018-02-18 DIAGNOSIS — I5032 Chronic diastolic (congestive) heart failure: Secondary | ICD-10-CM | POA: Insufficient documentation

## 2018-02-18 DIAGNOSIS — I509 Heart failure, unspecified: Secondary | ICD-10-CM

## 2018-02-18 DIAGNOSIS — Z79899 Other long term (current) drug therapy: Secondary | ICD-10-CM | POA: Insufficient documentation

## 2018-02-18 DIAGNOSIS — N179 Acute kidney failure, unspecified: Secondary | ICD-10-CM | POA: Insufficient documentation

## 2018-02-18 DIAGNOSIS — F1911 Other psychoactive substance abuse, in remission: Secondary | ICD-10-CM | POA: Insufficient documentation

## 2018-02-18 DIAGNOSIS — R011 Cardiac murmur, unspecified: Secondary | ICD-10-CM

## 2018-02-18 DIAGNOSIS — Z8249 Family history of ischemic heart disease and other diseases of the circulatory system: Secondary | ICD-10-CM | POA: Insufficient documentation

## 2018-02-18 DIAGNOSIS — R7989 Other specified abnormal findings of blood chemistry: Secondary | ICD-10-CM | POA: Insufficient documentation

## 2018-02-18 DIAGNOSIS — F1490 Cocaine use, unspecified, uncomplicated: Secondary | ICD-10-CM

## 2018-02-18 DIAGNOSIS — Z992 Dependence on renal dialysis: Secondary | ICD-10-CM

## 2018-02-18 DIAGNOSIS — Z91120 Patient's intentional underdosing of medication regimen due to financial hardship: Secondary | ICD-10-CM

## 2018-02-18 DIAGNOSIS — Z87891 Personal history of nicotine dependence: Secondary | ICD-10-CM | POA: Insufficient documentation

## 2018-02-18 DIAGNOSIS — I161 Hypertensive emergency: Secondary | ICD-10-CM | POA: Diagnosis present

## 2018-02-18 HISTORY — DX: Dependence on renal dialysis: Z99.2

## 2018-02-18 HISTORY — DX: End stage renal disease: N18.6

## 2018-02-18 LAB — COMPREHENSIVE METABOLIC PANEL
ALK PHOS: 169 U/L — AB (ref 38–126)
ALT: 9 U/L (ref 0–44)
AST: 17 U/L (ref 15–41)
Albumin: 3.6 g/dL (ref 3.5–5.0)
Anion gap: 13 (ref 5–15)
BUN: 29 mg/dL — AB (ref 6–20)
CALCIUM: 8.4 mg/dL — AB (ref 8.9–10.3)
CHLORIDE: 103 mmol/L (ref 98–111)
CO2: 20 mmol/L — AB (ref 22–32)
CREATININE: 4.29 mg/dL — AB (ref 0.44–1.00)
GFR, EST AFRICAN AMERICAN: 15 mL/min — AB (ref >60)
GFR, EST NON AFRICAN AMERICAN: 13 mL/min — AB (ref >60)
Glucose, Bld: 115 mg/dL — ABNORMAL HIGH (ref 70–99)
Potassium: 3.6 mmol/L (ref 3.5–5.1)
SODIUM: 136 mmol/L (ref 135–145)
Total Bilirubin: 0.6 mg/dL (ref 0.3–1.2)
Total Protein: 6.8 g/dL (ref 6.5–8.1)

## 2018-02-18 LAB — I-STAT CHEM 8, ED
BUN: 32 mg/dL — ABNORMAL HIGH (ref 6–20)
CALCIUM ION: 1.03 mmol/L — AB (ref 1.15–1.40)
Chloride: 105 mmol/L (ref 98–111)
Creatinine, Ser: 4.6 mg/dL — ABNORMAL HIGH (ref 0.44–1.00)
Glucose, Bld: 112 mg/dL — ABNORMAL HIGH (ref 70–99)
HCT: 32 % — ABNORMAL LOW (ref 36.0–46.0)
HEMOGLOBIN: 10.9 g/dL — AB (ref 12.0–15.0)
Potassium: 3.7 mmol/L (ref 3.5–5.1)
Sodium: 137 mmol/L (ref 135–145)
TCO2: 22 mmol/L (ref 22–32)

## 2018-02-18 LAB — CBC WITH DIFFERENTIAL/PLATELET
Abs Immature Granulocytes: 0 10*3/uL (ref 0.0–0.1)
BASOS ABS: 0.1 10*3/uL (ref 0.0–0.1)
BASOS PCT: 1 %
Eosinophils Absolute: 0.3 10*3/uL (ref 0.0–0.7)
Eosinophils Relative: 4 %
HCT: 30.5 % — ABNORMAL LOW (ref 36.0–46.0)
Hemoglobin: 9.7 g/dL — ABNORMAL LOW (ref 12.0–15.0)
IMMATURE GRANULOCYTES: 0 %
LYMPHS ABS: 1.5 10*3/uL (ref 0.7–4.0)
Lymphocytes Relative: 19 %
MCH: 28.3 pg (ref 26.0–34.0)
MCHC: 31.8 g/dL (ref 30.0–36.0)
MCV: 88.9 fL (ref 78.0–100.0)
MONOS PCT: 6 %
Monocytes Absolute: 0.5 10*3/uL (ref 0.1–1.0)
NEUTROS PCT: 70 %
Neutro Abs: 5.5 10*3/uL (ref 1.7–7.7)
PLATELETS: 148 10*3/uL — AB (ref 150–400)
RBC: 3.43 MIL/uL — ABNORMAL LOW (ref 3.87–5.11)
RDW: 16.3 % — ABNORMAL HIGH (ref 11.5–15.5)
WBC: 7.8 10*3/uL (ref 4.0–10.5)

## 2018-02-18 LAB — I-STAT TROPONIN, ED
TROPONIN I, POC: 0.12 ng/mL — AB (ref 0.00–0.08)
TROPONIN I, POC: 0.16 ng/mL — AB (ref 0.00–0.08)

## 2018-02-18 MED ORDER — LABETALOL HCL 100 MG PO TABS
100.0000 mg | ORAL_TABLET | Freq: Two times a day (BID) | ORAL | Status: DC
  Administered 2018-02-18 – 2018-02-19 (×3): 100 mg via ORAL
  Filled 2018-02-18 (×3): qty 1

## 2018-02-18 MED ORDER — SODIUM BICARBONATE 650 MG PO TABS
650.0000 mg | ORAL_TABLET | Freq: Two times a day (BID) | ORAL | Status: DC
  Administered 2018-02-18 – 2018-02-19 (×2): 650 mg via ORAL
  Filled 2018-02-18 (×2): qty 1

## 2018-02-18 MED ORDER — ACETAMINOPHEN 650 MG RE SUPP: 650.0000 mg | Freq: Four times a day (QID) | RECTAL | Status: DC | PRN

## 2018-02-18 MED ORDER — LABETALOL HCL 200 MG PO TABS: 100.0000 mg | ORAL_TABLET | Freq: Two times a day (BID) | ORAL | Status: DC

## 2018-02-18 MED ORDER — IOPAMIDOL (ISOVUE-370) INJECTION 76%
80.0000 mL | Freq: Once | INTRAVENOUS | Status: AC | PRN
  Administered 2018-02-18: 80 mL via INTRAVENOUS

## 2018-02-18 MED ORDER — CLONIDINE HCL 0.2 MG PO TABS
0.2000 mg | ORAL_TABLET | Freq: Three times a day (TID) | ORAL | Status: DC
  Administered 2018-02-18 – 2018-02-21 (×7): 0.2 mg via ORAL
  Filled 2018-02-18 (×7): qty 1

## 2018-02-18 MED ORDER — ACETAMINOPHEN 325 MG PO TABS
650.0000 mg | ORAL_TABLET | Freq: Four times a day (QID) | ORAL | Status: DC | PRN
  Filled 2018-02-18: qty 2

## 2018-02-18 MED ORDER — NITROGLYCERIN 0.4 MG SL SUBL
0.4000 mg | SUBLINGUAL_TABLET | SUBLINGUAL | Status: AC | PRN
  Administered 2018-02-18 (×3): 0.4 mg via SUBLINGUAL
  Filled 2018-02-18: qty 1

## 2018-02-18 MED ORDER — IOPAMIDOL (ISOVUE-370) INJECTION 76%
INTRAVENOUS | Status: AC
  Filled 2018-02-18: qty 100

## 2018-02-18 MED ORDER — ASPIRIN EC 81 MG PO TBEC: 81.0000 mg | DELAYED_RELEASE_TABLET | Freq: Every day | ORAL | Status: DC

## 2018-02-18 NOTE — ED Notes (Signed)
IV team at bedside attempting for appropriate IV for scan.

## 2018-02-18 NOTE — Progress Notes (Addendum)
Interventional Team Note:  Code STEMI called by EMS. Upon arrival to the ED, the patient has ongoing chest pain. She appears comfortable. BP 150/100. She is in sinus. EKG shows sinus rhythm with ST depression in the lateral and anterolateral leads. Poor R wave progression in the precordial leads. Subtle ST elevation in V1-V3. This is unchanged from her EKG in April 2019. She was actually in the ED last night with c/o chest pain but left prior to being seen by a physician. Troponin was negative last night. She has a history of type A aortic dissection with repair in 2018 in [place]. 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 Similar presentation in April 2019 with chest pain. Her chest pain resolved then with treatment of her HTN. She had multiple chest imaging studies to confirm stability of her aortic pathology. She has a history of cocaine abuse.   I do not think her presentation is consistent with an ACS. I would recommend cycling troponin and obtaining a chest CTA. If her troponin were to become positive, would consider cardiac cath.   Code STEMI cancelled. Discussed with ED staff Dr. Rex Kras. Please call cardiology if we can be of assistance.   Lauree Chandler 02/18/2018 1:21 PM

## 2018-02-18 NOTE — ED Notes (Signed)
Attempted to call report

## 2018-02-18 NOTE — ED Notes (Signed)
Pt not in the waiting area

## 2018-02-18 NOTE — ED Notes (Signed)
Pt not in waiting area. Pt phone called. No answer.

## 2018-02-18 NOTE — ED Provider Notes (Addendum)
[facility] EMERGENCY DEPARTMENT Provider Note   CSN: [id number] Arrival date & time: 02/18/18  1250     History   Chief Complaint Chief Complaint  Patient presents with  . Code STEMI    HPI Valerie Mathis is a 32 y.o. female.  32yo F w/ PMH including ESRD on HD, aortic dissection s/p repair, substance abuse, HTN who p/w chest pain.  Yesterday evening, she was leaving her house when she began to have central, nonradiating chest pain that was initially sharp but then became a dull squeezing pain.  She has had associated shortness of breath.  She came to the ED for evaluation but then left without being seen.  She went home, took Tylenol and fell asleep.  This morning she was doing okay and went to her routinely scheduled dialysis session, where she began having the same chest pain again and they called EMS.  She did not receive any dialysis today.  She currently endorses some mild pain.  No nausea, vomiting, cough, fevers, or recent illness.  The history is provided by the patient.    Past Medical History:  Diagnosis Date  . Aortic dissection ([place])   . CHF (congestive heart failure) ([place])   . CKD (chronic kidney disease) stage 4, GFR 15-29 ml/min (H[facility])   . H/O: alcohol abuse   . Hypertension   . Pneumonia   . Renal disorder   . 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 Cocaine abuse ([place])   . Hypertensive urgency 11/29/2017  . ARF (acute renal failure) ([place]) 11/29/2017  . History of LUE DVT (deep vein thrombosis) 10/21/2017  . Abdominal pain 10/20/2017  . Uncontrolled hypertension 10/20/2017  . Hypertensive cardiomyopathy ([place]) 10/20/2017  . Aortic dissection Stanford type B s/p repair 2018 ([place])   . Hypertensive heart disease without heart failure   . Cocaine use   . Hypertensive emergency 09/12/2017  . Back pain 09/12/2017  . Tachycardia 09/12/2017  . Chronic kidney disease Stage IV-V     . 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 A/V FISTULAGRAM Left 01/17/2018   Procedure: A/V Fistulagram;  Surgeon: Serafina Mitchell, MD;  Location: [facility] CV [facility];  Service: Cardiovascular;  Laterality: Left;  . AV FISTULA PLACEMENT Left 12/23/2017   Procedure: ARTERIOVENOUS (AV) FISTULA CREATION  LEFT ARM;  Surgeon: Rosetta Posner, MD;  Location: [facility];  Service: Vascular;  Laterality: Left;  . ECTOPIC PREGNANCY SURGERY  2018  . INSERTION OF DIALYSIS CATHETER Right 01/17/2018   Procedure: Insertion Of Dialysis Catheter;  Surgeon: Serafina Mitchell, MD;  Location: [facility];  Service: Cardiovascular;  Laterality: Right;  . LIGATION OF ARTERIOVENOUS  FISTULA Left 01/19/2018   Procedure: LIGATION OF ARTERIOVENOUS  FISTULA left  ARM;  Surgeon: Serafina Mitchell, MD;  Location: [facility];  Service: Vascular;  Laterality: Left;  . REPAIR OF ACUTE ASCENDING THORACIC AORTIC DISSECTION       OB History   None      Home Medications    Prior to Admission medications   Medication Sig Start Date End Date Taking? Authorizing Provider  amLODipine (NORVASC) 10 MG tablet Take 1 tablet (10 mg total) by mouth daily. for high blood pressure 01/23/18  Yes Gherghe, Vella Redhead, MD  cloNIDine (CATAPRES) 0.2 MG tablet Take 1 tablet (0.2 mg total) by mouth 3 (three)  times daily. 01/23/18  Yes Caren Griffins, MD  hydrALAZINE (APRESOLINE) 100 MG tablet Take 1 tablet (100 mg total) by mouth 3 (three) times daily. 01/23/18  Yes Caren Griffins, MD  isosorbide mononitrate (IMDUR) 120 MG 24 hr tablet Take 1 tablet (120 mg total) by mouth daily. 01/23/18  Yes Caren Griffins, MD  sodium bicarbonate 650 MG tablet Take 1 tablet (650 mg total) by mouth 2 (two) times daily. 01/23/18  Yes Caren Griffins, MD  calcitRIOL (ROCALTROL) 0.25 MCG capsule Take 1 capsule (0.25 mcg total) by mouth daily. Patient not taking: Reported on 02/14/2018 01/23/18   Caren Griffins, MD   ferrous sulfate 325 (65 FE) MG tablet Take 1 tablet (325 mg total) by mouth 2 (two) times daily with a meal. Patient not taking: Reported on 02/18/2018 01/23/18   Caren Griffins, MD  labetalol (NORMODYNE) 200 MG tablet Take 1 tablet (200 mg total) by mouth 2 (two) times daily. Patient not taking: Reported on 02/18/2018 01/23/18   Caren Griffins, MD    Family History Family History  Problem Relation Age of Onset  . Hypertension Mother     Social History Social History   Tobacco Use  . Smoking status: Former Smoker    Packs/day: 0.50    Years: 4.00    Pack years: 2.00    Types: Cigarettes  . Smokeless tobacco: Never Used  . Tobacco comment: 12/15/2017  Substance Use Topics  . Alcohol use: Not Currently  . Drug use: Not Currently    Types: Cocaine, Benzodiazepines    Comment: Last use 08/2017     Allergies   Patient has no known allergies.   Review of Systems Review of Systems All other systems reviewed and are negative except that which was mentioned in HPI   Physical Exam Updated Vital Signs BP (!) 161/93   Pulse 86   Temp 97.9 F (36.6 C) (Oral)   Resp 18   LMP 02/01/2018 (Approximate)   SpO2 100%   Physical Exam  Constitutional: She is oriented to person, place, and time. She appears well-developed and well-nourished. No distress.  HENT:  Head: Normocephalic and atraumatic.  Moist mucous membranes  Eyes: Conjunctivae are normal.  Neck: Neck supple.  Cardiovascular: Normal rate and regular rhythm.  Murmur heard. Pulmonary/Chest: Effort normal and breath sounds normal.  Abdominal: Soft. Bowel sounds are normal. She exhibits no distension. There is no tenderness.  Musculoskeletal: She exhibits no edema.  Neurological: She is alert and oriented to person, place, and time.  Fluent speech  Skin: Skin is warm and dry.  Central venous catheter in R chest with no erythema or drainage  Psychiatric: She has a normal mood and affect. Judgment normal.  Nursing  note and vitals reviewed.    ED Treatments / Results  Labs (all labs ordered are listed, but only abnormal results are displayed) Labs Reviewed  COMPREHENSIVE METABOLIC PANEL - Abnormal; Notable for the following components:      Result Value   CO2 20 (*)    Glucose, Bld 115 (*)    BUN 29 (*)    Creatinine, Ser 4.29 (*)    Calcium 8.4 (*)    Alkaline Phosphatase 169 (*)    GFR calc non Af Amer 13 (*)    GFR calc Af Amer 15 (*)    All other components within normal limits  CBC WITH DIFFERENTIAL/PLATELET - Abnormal; Notable for the following components:   RBC 3.43 (*)  Hemoglobin 9.7 (*)    HCT 30.5 (*)    RDW 16.3 (*)    Platelets 148 (*)    All other components within normal limits  I-STAT TROPONIN, ED - Abnormal; Notable for the following components:   Troponin i, poc 0.12 (*)    All other components within normal limits  I-STAT CHEM 8, ED - Abnormal; Notable for the following components:   BUN 32 (*)    Creatinine, Ser 4.60 (*)    Glucose, Bld 112 (*)    Calcium, Ion 1.03 (*)    Hemoglobin 10.9 (*)    HCT 32.0 (*)    All other components within normal limits    EKG EKG Interpretation  Date/Time:  Saturday February 18 2018 12:54:41 EDT Ventricular Rate:  99 PR Interval:    QRS Duration: 82 QT Interval:  402 QTC Calculation: 516 R Axis:   -28 Text Interpretation:  Sinus rhythm Probable left atrial enlargement LVH with secondary repolarization abnormality Prolonged QT interval ST depression and T wave inversion I, aVL similar to yesterday's tracing Confirmed by Theotis Burrow [phone]) on 02/18/2018 1:21:44 PM   Radiology Dg Chest 2 View  Result Date: 02/17/2018 CLINICAL DATA:  Chest pain. EXAM: CHEST - 2 VIEW COMPARISON:  Radiographs 12/18/2017 FINDINGS: Dual lumen chest port with tip at the atrial caval junction. Post median sternotomy. Decreased cardiomegaly from prior exam. Unchanged mediastinal contours. Persistent but improved small right pleural effusion. No  focal airspace disease or pneumothorax. No pulmonary edema. No acute osseous abnormalities. IMPRESSION: 1. Slight decreased cardiomegaly and right pleural effusion from prior exam. 2. Dual lumen right central line with tip at the atrial caval junction. Electronically Signed   By: Keith Rake M.D.   On: 02/17/2018 23:28   Dg Chest Port 1 View  Result Date: 02/18/2018 CLINICAL DATA:  Mid chest pain, shortness of Breath EXAM: PORTABLE CHEST 1 VIEW COMPARISON:  02/17/2018 FINDINGS: Right dialysis catheter remains in place, unchanged. Prior median sternotomy. Cardiomegaly. Prominent aortic arch. Cannot exclude aortic aneurysm. This appears stable when compared to prior study. No confluent opacities or effusions. No acute bony abnormality. IMPRESSION: Mild cardiomegaly. Prominent aortic arch, cannot exclude aortic aneurysm. No acute cardiopulmonary disease. Electronically Signed   By: Rolm Baptise M.D.   On: 02/18/2018 13:28    Procedures .Critical Care Performed by: Sharlett Iles, MD Authorized by: Sharlett Iles, MD   Critical care provider statement:    Critical care time (minutes):  30   Critical care time was exclusive of:  Separately billable procedures and treating other patients   Critical care was necessary to treat or prevent imminent or life-threatening deterioration of the following conditions:  Cardiac failure   Critical care was time spent personally by me on the following activities:  Development of treatment plan with patient or surrogate, discussions with consultants, examination of patient, obtaining history from patient or surrogate, ordering and performing treatments and interventions, ordering and review of laboratory studies, ordering and review of radiographic studies, re-evaluation of patient's condition and review of old charts   (including critical care time)  Medications Ordered in ED Medications  nitroGLYCERIN (NITROSTAT) SL tablet 0.4 mg (0.4 mg  Sublingual Given 02/18/18 1529)     Initial Impression / Assessment and Plan / ED Course  I have reviewed the triage vital signs and the nursing notes.  Pertinent labs & imaging results that were available during my care of the patient were reviewed by me and considered in my medical  decision making (see chart for details).     PT well appearing on exam, mildly hypertensive. Code STEMI had been called by EMS, Dr. Angelena Form at bedside to see pt. He reviewed EKGs and canceled STEMI alert, recommended CTA chest given h/o dissection with mural thrombus.   Lab work shows normal potassium, hemoglobin 9.7, initial troponin 0 0.12 which is slightly increased from last night's troponin of 0. Work up thus far reassuring against acute need for dialysis. I spoke with Dr. Jonnie Finner who has approved contrast administration for evaluation of aorta. At time of signout, pt is pending CTA to evaluate for new changes on aorta. She  Will require general cardiology consultation if CTA is negative.  Final Clinical Impressions(s) / ED Diagnoses   Final diagnoses:  Chest pain, unspecified type  Elevated troponin    ED Discharge Orders    None       Little, Wenda Overland, MD 02/18/18 1551    Little, Wenda Overland, MD 02/18/18 [phone]

## 2018-02-18 NOTE — H&P (Addendum)
Date: 02/18/2018               Patient Name:  Valerie Mathis MRN: [id number]  DOB: 04-Jul-1985 Age / Sex: 32 y.o., female   PCP: Patient, No Pcp Per         Medical Service: Internal Medicine Teaching Service         Attending Physician: Dr. Annia Belt, MD    First Contact: Dr. Myrtie Hawk Pager: [phone]  Second Contact: Dr. Tarri Abernethy Pager: [phone]       After Hours (After 5p/  First Contact Pager: [phone]  weekends / holidays): Second Contact Pager: [phone]   Chief Complaint: Chest pain  History of Present Illness: This is a 32 year old female with a history of aortic dissection s/p repair in 2018, ESRD (Tues, Thurs ,Sat HD) , substance abuse, CHF (Last Echo 11/30/17 showed EF of 55-60%), TTP, and HTN presenting with chest pain. She reports that the chest pain started yesterday around 6-7 pm, she states that she was walking out of her sisters house when it started. She reports that it was located in the middle of her chest, was sharp and then dull in nature, with some radiation to her back, it lasted for about 10-20 minutes. Associated with shortness of breath and feeling warm. She went to the ED to be evaluated but went home without being seen because she had an issue with her daughter. She took some tylenol and went to bed. This morning she went to hemodialysis and after 10 minutes of dialysis she started having the chest pain again and that it was similar to the episode last night, she states that this lasted for about 30 minutes. Of note she states that since Monday, 5 days prior to admission, she had run out of her medications due to financial issues. She states that she had 2 pills of hydralazine and clonidine. She states that she was told not to take anything other then clonidine when she goes to dialysis. She reports that she has run out of her pills before and has developed similar chest pains to what brought her in today. She denies any other symptoms with the chest pain  including nausea, vomiting, fevers, chills, left arm pain, or diaphoresis. She denies any regurgitation, heart burn, eating spicy foods, or history of GERD. Denies any sick contacts.      It appears that she had the aortic dissection surgery in September 2018 due to a type A aortic dissection, she also has chronic type B dissection.  She has had multiple admissions due to uncontrolled hypertension over the past year, often due to being out of her medications due to financial issues. Her last admission was 8/31-9/9, she presented with flank pain and found to be hypertensive to 233/151, she was started on an esmolol gtt drip and spent time in the ICU. She had a CT renal study which showed propagation of her descending thoracic aortic dissection, cardiothoracic surgery was consulted and since her symptoms were not suggestive of problems related to the thoracic aorta they recommended medical management.  In the ED patient was found to be afebrile, hypertensive with a BP 196/124, HR 107, and RR 18. Labs showed a troponin of 0.12 and 0.16, BUN 32, Cr 4.6 (baseline around 4.2), Hgb 10.9, Hct 32. CBC showed WBC 7.8, Hgb 9.7, MCV 88.9, and PLT 148. CXR showed cardiomegaly, with no acute cardiopulmonary findings. She had a CTA which showed stable postop changes s/p ascending thoracic  aorta dissection repair with chronic type B dissection, aortic arch measuring 4.4 cm, small ulcerate plaque or tiny focal distal descending thoracic aortic chronic dissection along left lateral wall, cardiomegaly, nonaneurysmal abdominal aorta without dissection. She received 3 doses of nitroglycerin. EKG showed subtle ST elevation in V1-V3 which was unchanged from previous EKG. Cardiology evaluated, they think her chest pain could be related to her uncontrolled hypertension but ACS could not be ruled out, recommended TEE, and starting heparin and ASA, and considering stress testing. Patient was admitted to internal medicine.    Meds: Current Meds  Medication Sig  . amLODipine (NORVASC) 10 MG tablet Take 1 tablet (10 mg total) by mouth daily. for high blood pressure  . cloNIDine (CATAPRES) 0.2 MG tablet Take 1 tablet (0.2 mg total) by mouth 3 (three) times daily.  . hydrALAZINE (APRESOLINE) 100 MG tablet Take 1 tablet (100 mg total) by mouth 3 (three) times daily.  . isosorbide mononitrate (IMDUR) 120 MG 24 hr tablet Take 1 tablet (120 mg total) by mouth daily.  . sodium bicarbonate 650 MG tablet Take 1 tablet (650 mg total) by mouth 2 (two) times daily.     Allergies: Allergies as of 02/18/2018  . (No Known Allergies)   Past Medical History:  Diagnosis Date  . Aortic dissection ([place])   . CHF (congestive heart failure) ([place])   . CKD (chronic kidney disease) stage 4, GFR 15-29 ml/min ([facility])   . H/O: alcohol abuse   . Hypertension   . Pneumonia   . Renal disorder   . TTP (thrombotic thrombocytopenic purpura) ([facility])     Family History: Reports mother, grandmother, grandfather, and uncles have hypertension. Grandmother and uncle has diabetes. Denies any other medical conditions, including any vasculitis or autoimmune conditions.   Social History: She denies and EtOH or smoking. She reports that she has done cocaine, last used about 3-4 months ago, reports that she did have any use in the past. She lives at home with her sister, she reports that she is not doing much at the moment.   Review of Systems: A complete ROS was negative except as per HPI. Review of Systems  Constitutional: Negative for chills and fever.  Respiratory: Positive for shortness of breath. Negative for cough and wheezing.   Cardiovascular: Positive for chest pain. Negative for palpitations, orthopnea and leg swelling.  Gastrointestinal: Negative for abdominal pain, nausea and vomiting.  Musculoskeletal: Positive for back pain. Negative for joint pain and myalgias.  Skin: Negative for itching and rash.  Neurological: Negative for  dizziness and headaches.  Psychiatric/Behavioral: Negative for depression. The patient is not nervous/anxious.      Physical Exam: Blood pressure (!) 154/110, pulse 75, temperature 97.9 F (36.6 C), temperature source Oral, resp. rate (!) 28, last menstrual period 02/01/2018, SpO2 100 %. Physical Exam  Constitutional: She is oriented to person, place, and time and well-developed, well-nourished, and in no distress.  HENT:  Head: Normocephalic and atraumatic.  Eyes: Pupils are equal, round, and reactive to light. Conjunctivae and EOM are normal.  Neck: Normal range of motion. Neck supple. No JVD present. No tracheal deviation present.  Cardiovascular: Normal rate and regular rhythm.  Murmur (2/6 systolic murmur) heard. Pulmonary/Chest: Effort normal and breath sounds normal. No respiratory distress. She has no wheezes. She exhibits no tenderness.  Abdominal: Soft. Bowel sounds are normal. She exhibits no distension. There is no tenderness. There is no rebound.  Musculoskeletal: Normal range of motion. She exhibits no edema or tenderness (improvement  of back pain with palpation).  Neurological: She is alert and oriented to person, place, and time. She has normal reflexes. GCS score is 15.  Skin: Skin is warm and dry. No rash noted. She is not diaphoretic. No erythema.  Psychiatric: Mood and affect normal.     EKG: personally reviewed my interpretation is rate of 99 bmp, normal axis, normal sinus rhythm, ST depression in leads I, II, aVL, and V4-V6, mild ST elevations in leads V1-V3,   CXR: personally reviewed my interpretation is sternotomy wires present, cardiomegaly, no signs of edema, consolidation, or nodules  CTA  IMPRESSION: Vascular:  1. Stable remote postop change status post ascending thoracic aortic dissection repair. 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 2. Small ulcerated plaque or tiny focal distal descending thoracic aortic chronic dissection along the left lateral wall, series 8/78 versus series 3/5 of prior CT abdomen. No significant change. 3. Cardiomegaly with left ventricular hypertrophy is redemonstrated. 4. Nonaneurysmal abdominal aorta without dissection. Patent branch vessels.  Nonvascular:  1. Uncomplicated cholelithiasis. 2. Stigmata of chronic renal osteodystrophy. 3. Left adnexal cyst measuring 5.8 x 4 x 5 cm. This could be further correlated with ultrasound of the pelvis.  Assessment & Plan by Problem: This is a 32 year old female with a history of aortic dissection s/p repair in Sept 2018, HTN, prior cocaine use, and ESRD on Tue/Thur/Sat hemodialysis presenting with chest pain.    Chest pain: Patient presented with substernal chest pain that is sharp and dull in nature, associated with shortness of breath and some back pain. She reports that it does not worsen with activity.  She has not been taking her medications for about 5 days and reports that she had similar episodes in the past when she stopped her medications. She was found to be hypertensive up to 196/124 with tachycardia. Her troponin's were elevated to 0.12 and 0.16. EKG did not show signs of acute ischemia, just some new LVH and subtle st elevations in leads V1-V3. There is some concern that this could be caused by hypertensive urgency 2/2 medication non-compliance. Given her history of cocaine use it's possible that this could be cocaine induced, she reports that she has not done cocaine for 3-4 months however per chart review it appears that she denied any use and still showed positive on UDS. Her back pain could be musculoskeletal in nature given that it is reproducible, less likely related to reoccurrence of dissection given the CTA findings showing stable  findings.   -Telemetry -Trend troponin -Repeat EKG in AM -ASA 81 mg daily -Heparin drip -Cardiology consulted, they recommend obtaining a TTE, ASA, and heparin, appreciate recommendations -UDS  Hypertensive urgency: Patient has not been taking her medications since Monday, on arrival her blood pressure was up to 196/124. She has been having this chest pain which could be a sign of end-organ damage. It appears that she has had issues obtaining her medications in the past and has had multiple admissions last year due to hypertensive crises. She is on amlodipine 10 mg daily, clonidine 0.2 mg TID, hydralazine 100 mg TID. She reports that she does not think she is taking labetalol.  -Restart home labetalol at 100 mg BID -Restart home clonidine 0.2 mg TID -Continue to monitor vitals  End stage renal disease: She is  on HD on Tuesday, Thursday, and Saturday, she only got about 10 minutes of hemodialysis today. She has been having worsening kidney disease 2/2 hypertension and polysubstance use, started hemodialysis in September 2019. It was also thought that she could have TTP on her last admission which may have caused the renal failure vs the hypertension, her platelets on admission was 148. She has a right catheter in place, her left fistula has become unusable and will be getting a right fistula placed soon. On admission her BUN was 32 and Cr 4.6 which is close to her baseline, her electrolytes were WNL.  She does not appear fluid overloaded on exam.  -Consider nephrology consult tomorrow if renal labs worsen or she becomes volume overloaded -BMP in AM -CBC -Continue home sodium bicarbonate -Strict I+Os -Hold fluids  History of Cocaine use: Patient reports that she last used about 3-4 months ago. Last UDS on 8/31 was positive for cocaine use. It's possible that her uncontrolled hypertension and chest pain could be related to substance use.  -UDS -Cessation encouragement  FEN: No fluids, replete  lytes prn, NPO VTE ppx: Heparin Code Status: FULL  Dispo: Admit patient to Observation with expected length of stay less than 2 midnights.  Signed: Asencion Noble, MD 02/18/2018, 8:58 PM  Pager: [phone]

## 2018-02-18 NOTE — ED Triage Notes (Signed)
Pt to ER as STEMI via [facility] for acute onset of central chest pain radiating to her back while initiating dialysis treatment. Pt reports shortness of breath with this. Reports has been intermittent since last night. Pt in NAD on arrival. History of aortic dissection 2018. VSS. Received 324 mg aspirin prior to arrival. No IV in place on arrival.

## 2018-02-18 NOTE — Consult Note (Addendum)
Cardiology Consult    Patient ID: Valerie Mathis MRN: [id number], DOB/AGE: Apr 14, 1986   Admit date: 02/18/2018 Date of Consult: 02/18/2018  Primary Physician: Patient, No Pcp Per Primary Cardiologist: Sanda Klein, MD Requesting Provider: Rex Kras  Patient Profile    Valerie Mathis is a 32 y.o. female with a history of hypertension, aortic dissection status post repair in 2018, end-stage renal disease, substance abuse.  She presents with new onset chest pain to the emergency room.  During her evaluation she was found to have elevated troponins.  STEMI was activated but then canceled due to low suspicion for ACS.  Patient reports onset within the last 24 hours while at rest.  Pain is radiating to the back.  Pain is worse when pushed up on her back.  Pain starts in the front and goes to the back.  Sitting up does not make the pain better.  She went to her dialysis center today and pain was exacerbated by dialysis session.  Aborted dialysis early to come to the emergency room.    She no recent sick exposures.  Is dealing with a lot of stress right now.  Is a single mom of a child its 63 years old.  Not checking her blood pressure at home.  Has no financial means thus has not been taking medications for her blood pressure up until yesterday.  For that she has been taking them on and off.  troponins that were elevated 0.16.  CT chest for dissection was negative.  ECG without signs of acute ischemia  Echo this year showed normal LV function  Echo in 2000Past Medical History   Past Medical History:  Diagnosis Date  . Aortic dissection ([place])   . CHF (congestive heart failure) ([place])   . CKD (chronic kidney disease) stage 4, GFR 15-29 ml/min (HCC)   . H/O: alcohol abuse   . Hypertension   . Pneumonia   . Renal disorder   . 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 A/V FISTULAGRAM Left 01/17/2018   Procedure: A/V Fistulagram;   Surgeon: Serafina Mitchell, MD;  Location: [facility];  Service: Cardiovascular;  Laterality: Left;  . AV FISTULA PLACEMENT Left 12/23/2017   Procedure: ARTERIOVENOUS (AV) FISTULA CREATION  LEFT ARM;  Surgeon: Rosetta Posner, MD;  Location: [facility];  Service: Vascular;  Laterality: Left;  . ECTOPIC PREGNANCY SURGERY  2018  . INSERTION OF DIALYSIS CATHETER Right 01/17/2018   Procedure: Insertion Of Dialysis Catheter;  Surgeon: Serafina Mitchell, MD;  Location: [facility];  Service: Cardiovascular;  Laterality: Right;  . LIGATION OF ARTERIOVENOUS  FISTULA Left 01/19/2018   Procedure: LIGATION OF ARTERIOVENOUS  FISTULA left  ARM;  Surgeon: Serafina Mitchell, MD;  Location: [facility];  Service: Vascular;  Laterality: Left;  . REPAIR OF ACUTE ASCENDING THORACIC AORTIC DISSECTION       Allergies  No Known Allergies   Inpatient Medications    . cloNIDine  0.2 mg Oral TID    Family History    Family History  Problem Relation Age of Onset  . Hypertension Mother    She indicated that her mother is alive. She indicated that her father is alive.   Social History    Social History   Socioeconomic History  . Marital status: Single    Spouse name: Not on file  . Number of children: Not on file  . Years of education:  Not on file  . Highest education level: Not on file  Occupational History  . Not on file  Social Needs  . Financial resource strain: Not on file  . Food insecurity:    Worry: Patient refused    Inability: Patient refused  . Transportation needs:    Medical: Patient refused    Non-medical: Patient refused  Tobacco Use  . Smoking status: Former Smoker    Packs/day: 0.50    Years: 4.00    Pack years: 2.00    Types: Cigarettes  . Smokeless tobacco: Never Used  . Tobacco comment: 12/15/2017  Substance and Sexual Activity  . Alcohol use: Not Currently  . Drug use: Not Currently    Types: Cocaine, Benzodiazepines    Comment: Last use 08/2017  . Sexual activity: Yes    Lifestyle  . Physical activity:    Days per week: 0 days    Minutes per session: 0 min  . Stress: Not at all  Relationships  . Social connections:    Talks on phone: More than three times a week    Gets together: More than three times a week    Attends religious service: More than 4 times per year    Active member of club or organization: Not on file    Attends meetings of clubs or organizations: Never    Relationship status: Never married  . Intimate partner violence:    Fear of current or ex partner: Patient refused    Emotionally abused: Patient refused    Physically abused: Patient refused    Forced sexual activity: Patient refused  Other Topics Concern  . Not on file  Social History Narrative  . Not on file     Review of Systems    General:  No chills, fever, night sweats or weight changes.  Cardiovascular:  No chest pain, dyspnea on exertion, edema, orthopnea, palpitations, paroxysmal nocturnal dyspnea. Dermatological: No rash, lesions/masses Respiratory: No cough, dyspnea Urologic: No hematuria, dysuria Abdominal:   No nausea, vomiting, diarrhea, bright red blood per rectum, melena, or hematemesis Neurologic:  No visual changes, wkns, changes in mental status. All other systems reviewed and are otherwise negative except as noted above.  Physical Exam    Blood pressure (!) 154/110, pulse 75, temperature 97.9 F (36.6 C), temperature source Oral, resp. rate (!) 28, last menstrual period 02/01/2018, SpO2 100 %.  General: Pleasant, NAD Psych: Normal affect. Neuro: Alert and oriented X 3. Moves all extremities spontaneously. HEENT: Normal  Neck: Supple without bruits or JVD. Lungs:  Resp regular and unlabored, CTA. Heart: RRR no s3, s4, or murmurs. Abdomen: Soft, non-tender, non-distended, BS + x 4.  Extremities: No clubbing, cyanosis or edema. DP/PT/Radials 2+ and equal bilaterally.  Labs    Troponin (Point of Care Test) Recent Labs    02/18/18 1650   TROPIPOC 0.16*   No results for input(s): CKTOTAL, CKMB, TROPONINI in the last 72 hours. Lab Results  Component Value Date   WBC 7.8 02/18/2018   HGB 10.9 (L) 02/18/2018   HCT 32.0 (L) 02/18/2018   MCV 88.9 02/18/2018   PLT 148 (L) 02/18/2018    Recent Labs  Lab 02/18/18 1309 02/18/18 1316  NA 136 137  K 3.6 3.7  CL 103 105  CO2 20*  --   BUN 29* 32*  CREATININE 4.29* 4.60*  CALCIUM 8.4*  --   PROT 6.8  --   BILITOT 0.6  --   ALKPHOS 169*  --  ALT 9  --   AST 17  --   GLUCOSE 115* 112*   Lab Results  Component Value Date   CHOL 178 11/30/2017   HDL 42 11/30/2017   LDLCALC 121 (H) 11/30/2017   TRIG 77 11/30/2017   No results found for: [facility]   Radiology Studies    Dg Chest 2 View  Result Date: 02/17/2018 CLINICAL DATA:  Chest pain. EXAM: CHEST - 2 VIEW COMPARISON:  Radiographs 12/18/2017 FINDINGS: Dual lumen chest port with tip at the atrial caval junction. Post median sternotomy. Decreased cardiomegaly from prior exam. Unchanged mediastinal contours. Persistent but improved small right pleural effusion. No focal airspace disease or pneumothorax. No pulmonary edema. No acute osseous abnormalities. IMPRESSION: 1. Slight decreased cardiomegaly and right pleural effusion from prior exam. 2. Dual lumen right central line with tip at the atrial caval junction. Electronically Signed   By: Keith Rake M.D.   On: 02/17/2018 23:28   Dg Chest Port 1 View  Result Date: 02/18/2018 CLINICAL DATA:  Mid chest pain, shortness of Breath EXAM: PORTABLE CHEST 1 VIEW COMPARISON:  02/17/2018 FINDINGS: Right dialysis catheter remains in place, unchanged. Prior median sternotomy. Cardiomegaly. Prominent aortic arch. Cannot exclude aortic aneurysm. This appears stable when compared to prior study. No confluent opacities or effusions. No acute bony abnormality. IMPRESSION: Mild cardiomegaly. Prominent aortic arch, cannot exclude aortic aneurysm. No acute cardiopulmonary disease.  Electronically Signed   By: Rolm Baptise M.D.   On: 02/18/2018 13:28   Ct Angio Chest/abd/pel For Dissection W And/or W/wo  Result Date: 02/18/2018 CLINICAL DATA:  32 year old female with chest pain yesterday and left AMA. While in dialysis, patient began to experience chest pain and dyspnea. Elevated troponin levels. 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 EXAM: CT ANGIOGRAPHY CHEST, ABDOMEN AND PELVIS TECHNIQUE: Multidetector CT imaging through the chest, abdomen and pelvis was performed using the standard protocol during bolus administration of intravenous contrast. Multiplanar reconstructed images and MIPs were obtained and reviewed to evaluate the vascular anatomy. CONTRAST:  45mL ISOVUE-370 IOPAMIDOL (ISOVUE-370) INJECTION 76% COMPARISON:  CT abdomen and pelvis 01/14/2018, MRA of the chest 11/29/2017 FINDINGS: CTA CHEST FINDINGS Cardiovascular: The unenhanced images through the chest demonstrate no mural hematoma. Postop change from prior dissection repair along the ascending thoracic aorta and proximal aortic arch. Allowing for differences in technique from the MRA,, redemonstration chronic dissection involving the aortic arch and descending thoracic aorta. The great vessels are fat from the true lumen seen along the posteromedial aspect of the dissection flap. The false lumen is seen anteriorly and laterally. As measured from prior exam, the arch measures up to 4.4 cm in caliber and is unchanged. The dissection flap appears fenestrated along its distal aspect allowing communication between the false and true lumen. There may be a small ulcerated plaque or separate small dissection flap along the distal descending thoracic aorta, previously noted on 01/14/2018 CT abdomen and pelvis. The thoracic aorta measures 3.4 cm in maximum transverse dimension versus 3.2 cm previously. Dialysis  catheter noted with tip in the right atrium. Left ventricular hypertrophy. Mediastinum/Nodes: No lymphadenopathy.  No mediastinal hematoma. Lungs/Pleura: Interval decrease in right-sided pleural effusion now trace on the right. Passive atelectasis noted at each lung base. Subsegmental atelectasis and/or scarring is seen in the left lower lobe. Musculoskeletal: Diffuse osteosclerosis appearance of the thoracic spine compatible with changes of chronic renal osteodystrophy. Review of the MIP images  confirms the above findings. CTA ABDOMEN AND PELVIS FINDINGS VASCULAR Aorta: Normal caliber aorta without aneurysm, dissection, vasculitis or significant stenosis. Celiac: Patent without evidence of aneurysm, dissection, vasculitis or significant stenosis. SMA: Patent without evidence of aneurysm, dissection, vasculitis or significant stenosis. Renals: Both renal arteries are patent without evidence of aneurysm, dissection, vasculitis, fibromuscular dysplasia or significant stenosis. IMA: Patent without evidence of aneurysm, dissection, vasculitis or significant stenosis. Inflow: Patent without evidence of aneurysm, dissection, vasculitis or significant stenosis. Veins: No obvious venous abnormality within the limitations of this arterial phase study. Review of the MIP images confirms the above findings. NON-VASCULAR Hepatobiliary: No focal liver abnormality is seen. Tiny layering gallstones are noted within the gallbladder without secondary signs of acute cholecystitis. Pancreas: Unremarkable. No pancreatic ductal dilatation or surrounding inflammatory changes. Spleen: Normal in size without focal abnormality. Adrenals/Urinary Tract: No adrenal mass identified. No significant renal cortical scarring. No dominant mass of either kidney. No obstructive uropathy. Physiologically distended urinary bladder. No focal mural thickening or calculus noted within. Stomach/Bowel: The appendix is not confidently identified but no  pericecal inflammation is noted to suggest acute appendicitis. Moderate stool retention is noted within the right colon. No bowel obstruction or inflammation. The stomach is decompressed in appearance. Normal small bowel rotation is noted. Lymphatic: No lymphadenopathy by CT size criteria. Reproductive: Left adnexal 5.8 x 4 x 5 cm cyst which could be further correlated with ultrasound. Uterus and right adnexa appear unremarkable. Other: No free air nor free fluid. Musculoskeletal: Diffuse sclerotic appearance of the dorsal spine compatible with chronic renal osteodystrophy. No acute nor aggressive osseous lesions. Review of the MIP images confirms the above findings. IMPRESSION: Vascular: 1. Stable remote postop change status post ascending thoracic aortic dissection repair. 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 2. Small ulcerated plaque or tiny focal distal descending thoracic aortic chronic dissection along the left lateral wall, series 8/78 versus series 3/5 of prior CT abdomen. No significant change. 3. Cardiomegaly with left ventricular hypertrophy is redemonstrated. 4. Nonaneurysmal abdominal aorta without dissection. Patent branch vessels. Nonvascular: 1. Uncomplicated cholelithiasis. 2. Stigmata of chronic renal osteodystrophy. 3. Left adnexal cyst measuring 5.8 x 4 x 5 cm. This could be further correlated with ultrasound of the pelvis. Electronically Signed   By: Ashley Royalty M.D.   On: 02/18/2018 17:14    ECG & Cardiac Imaging    Normal sinus rhythm with LVH.  No signs of acute ischemia.- personally reviewed.  Assessment & Plan    Chest pain: New diagnosis.  Currently in mild chest pain.  Her CAD risk factors include long-standing untreated hypertension.  History of polysubstance abuse.   Last time was 4 months ago.  Also with end-stage renal disease.  Start on dialysis 1 month ago.  Not yet on renal transplant list.  Causes of her chest pain with troponin elevation could be uncontrolled hypertension.  Coronary disease cannot be excluded.  However given her unremarkable EKG and only low level of troponin MI in the setting of end-stage renal disease this is unlikely to be a near totally or totally occluded lesion.   Recommendations: -Serial troponins -Serial EKGs - tte -Would start on heparin and aspirin -Control BP but she likely lives in a higher range so dont drop too fast -Consider stress test [nuclear stress test might be  preferred] -Renal disease management per primary team.  She does appear hypervolemic on exam.  Signed, Cristina Gong, MD For questions or updates, please contact   Please consult [email or web address] for contact info under Cardiology/STEMI.

## 2018-02-19 ENCOUNTER — Observation Stay (HOSPITAL_BASED_OUTPATIENT_CLINIC_OR_DEPARTMENT_OTHER): Payer: Medicaid Other

## 2018-02-19 ENCOUNTER — Other Ambulatory Visit: Payer: Self-pay

## 2018-02-19 ENCOUNTER — Encounter (HOSPITAL_COMMUNITY): Payer: Self-pay | Admitting: Nephrology

## 2018-02-19 DIAGNOSIS — Z992 Dependence on renal dialysis: Secondary | ICD-10-CM | POA: Diagnosis not present

## 2018-02-19 DIAGNOSIS — I161 Hypertensive emergency: Secondary | ICD-10-CM | POA: Diagnosis not present

## 2018-02-19 DIAGNOSIS — N186 End stage renal disease: Secondary | ICD-10-CM | POA: Diagnosis not present

## 2018-02-19 DIAGNOSIS — I361 Nonrheumatic tricuspid (valve) insufficiency: Secondary | ICD-10-CM | POA: Diagnosis not present

## 2018-02-19 DIAGNOSIS — I5030 Unspecified diastolic (congestive) heart failure: Secondary | ICD-10-CM | POA: Diagnosis not present

## 2018-02-19 DIAGNOSIS — I160 Hypertensive urgency: Secondary | ICD-10-CM

## 2018-02-19 DIAGNOSIS — R0789 Other chest pain: Secondary | ICD-10-CM

## 2018-02-19 DIAGNOSIS — R079 Chest pain, unspecified: Secondary | ICD-10-CM | POA: Diagnosis not present

## 2018-02-19 DIAGNOSIS — I7101 Dissection of thoracic aorta: Secondary | ICD-10-CM

## 2018-02-19 DIAGNOSIS — D509 Iron deficiency anemia, unspecified: Secondary | ICD-10-CM

## 2018-02-19 DIAGNOSIS — I5032 Chronic diastolic (congestive) heart failure: Secondary | ICD-10-CM | POA: Diagnosis not present

## 2018-02-19 DIAGNOSIS — D649 Anemia, unspecified: Secondary | ICD-10-CM

## 2018-02-19 DIAGNOSIS — I132 Hypertensive heart and chronic kidney disease with heart failure and with stage 5 chronic kidney disease, or end stage renal disease: Secondary | ICD-10-CM | POA: Diagnosis not present

## 2018-02-19 LAB — PHOSPHORUS: PHOSPHORUS: 4.8 mg/dL — AB (ref 2.5–4.6)

## 2018-02-19 LAB — ECHOCARDIOGRAM COMPLETE
Height: 63 in
Weight: 1761.6 oz

## 2018-02-19 LAB — TROPONIN I
TROPONIN I: 0.23 ng/mL — AB (ref <0.03)
Troponin I: 0.27 ng/mL (ref <0.03)
Troponin I: 0.27 ng/mL (ref <0.03)

## 2018-02-19 LAB — FERRITIN: Ferritin: 43 ng/mL (ref 11–307)

## 2018-02-19 LAB — BRAIN NATRIURETIC PEPTIDE

## 2018-02-19 LAB — MRSA PCR SCREENING: MRSA by PCR: NEGATIVE

## 2018-02-19 MED ORDER — MUPIROCIN 2 % EX OINT
1.0000 "application " | TOPICAL_OINTMENT | Freq: Two times a day (BID) | CUTANEOUS | Status: DC
  Administered 2018-02-20 – 2018-02-21 (×3): 1 via NASAL
  Filled 2018-02-19 (×2): qty 22

## 2018-02-19 MED ORDER — ASPIRIN EC 325 MG PO TBEC
325.0000 mg | DELAYED_RELEASE_TABLET | Freq: Every day | ORAL | Status: DC
  Administered 2018-02-21: 325 mg via ORAL
  Filled 2018-02-19: qty 1

## 2018-02-19 MED ORDER — CHLORHEXIDINE GLUCONATE 4 % EX LIQD
60.0000 mL | Freq: Once | CUTANEOUS | Status: AC
  Administered 2018-02-20: 4 via TOPICAL

## 2018-02-19 MED ORDER — CHLORHEXIDINE GLUCONATE 4 % EX LIQD
60.0000 mL | Freq: Once | CUTANEOUS | Status: AC
  Administered 2018-02-19: 4 via TOPICAL
  Filled 2018-02-19: qty 120

## 2018-02-19 MED ORDER — HEPARIN BOLUS VIA INFUSION
3000.0000 [IU] | Freq: Once | INTRAVENOUS | Status: AC
  Administered 2018-02-19: 3000 [IU] via INTRAVENOUS
  Filled 2018-02-19: qty 3000

## 2018-02-19 MED ORDER — SODIUM CHLORIDE 0.9 % IV SOLN
INTRAVENOUS | Status: DC
  Administered 2018-02-20: 06:00:00 via INTRAVENOUS

## 2018-02-19 MED ORDER — AMLODIPINE BESYLATE 10 MG PO TABS: 10.0000 mg | ORAL_TABLET | Freq: Every day | ORAL | Status: DC

## 2018-02-19 MED ORDER — CHLORHEXIDINE GLUCONATE CLOTH 2 % EX PADS
6.0000 | MEDICATED_PAD | Freq: Every day | CUTANEOUS | Status: DC
  Administered 2018-02-20: 6 via TOPICAL

## 2018-02-19 MED ORDER — HYDRALAZINE HCL 50 MG PO TABS
100.0000 mg | ORAL_TABLET | Freq: Three times a day (TID) | ORAL | Status: DC
  Administered 2018-02-19 – 2018-02-21 (×6): 100 mg via ORAL
  Filled 2018-02-19 (×6): qty 2

## 2018-02-19 MED ORDER — CHLORHEXIDINE GLUCONATE CLOTH 2 % EX PADS
6.0000 | MEDICATED_PAD | Freq: Every day | CUTANEOUS | Status: DC
  Administered 2018-02-19: 6 via TOPICAL

## 2018-02-19 MED ORDER — HEPARIN (PORCINE) IN NACL 100-0.45 UNIT/ML-% IJ SOLN
600.0000 [IU]/h | INTRAMUSCULAR | Status: DC
  Administered 2018-02-19: 600 [IU]/h via INTRAVENOUS
  Filled 2018-02-19: qty 250

## 2018-02-19 MED ORDER — HEPARIN SODIUM (PORCINE) 1000 UNIT/ML DIALYSIS
4600.0000 [IU] | Freq: Once | INTRAMUSCULAR | Status: DC
  Filled 2018-02-19: qty 5

## 2018-02-19 MED ORDER — AMLODIPINE BESYLATE 10 MG PO TABS
10.0000 mg | ORAL_TABLET | Freq: Every day | ORAL | Status: DC
  Administered 2018-02-19 – 2018-02-21 (×2): 10 mg via ORAL
  Filled 2018-02-19 (×2): qty 1

## 2018-02-19 MED ORDER — ATORVASTATIN CALCIUM 80 MG PO TABS
80.0000 mg | ORAL_TABLET | Freq: Every day | ORAL | Status: DC
  Administered 2018-02-19 – 2018-02-21 (×4): 80 mg via ORAL
  Filled 2018-02-19 (×4): qty 1

## 2018-02-19 MED ORDER — CEFAZOLIN SODIUM-DEXTROSE 2-4 GM/100ML-% IV SOLN
2.0000 g | INTRAVENOUS | Status: AC
  Administered 2018-02-20: 2 g via INTRAVENOUS
  Filled 2018-02-19: qty 100

## 2018-02-19 MED ORDER — RENA-VITE PO TABS
1.0000 | ORAL_TABLET | Freq: Every day | ORAL | Status: DC
  Administered 2018-02-19 – 2018-02-20 (×2): 1 via ORAL
  Filled 2018-02-19 (×2): qty 1

## 2018-02-19 NOTE — Progress Notes (Signed)
CRITICAL VALUE ALERT  Critical Value:  Troponin 0.27  Date & Time Notied:  10/6 @ 0108  Provider Notified: [facility]  Orders Received/Actions taken: waiting for orders. Patient asymptomatic. Will continue monitoring patient.

## 2018-02-19 NOTE — Consult Note (Signed)
Renal Service Consult Note [facility]  Valerie Mathis 02/19/2018 [place] Requesting Physician: Dr Beryle Beams  Reason for Consult:  ESRD pt w CP HPI: The patient is a 32 y.o. year-old with hx of HTN, ESRD and aortic dissection presented yest w/ CP occurring early on HD.  She had her dissection surgery last year in [place], then moved here, had CKD and was started on HD about 1 mo ago here in [place].  She is due for a new perm access surgery MOnday w/ [facility].  She didn't get much HD yest.  She is not c/o SOB or CP.  SHe has a tunneled HD cath for HD now.     ROS  denies CP  no joint pain   no HA  no blurry vision  no rash  no diarrhea  no nausea/ vomiting    Past Medical History  Past Medical History:  Diagnosis Date  . Aortic dissection ([place])   . CHF (congestive heart failure) ([place])   . ESRD on hemodialysis ([place])   . H/O: alcohol abuse   . Hypertension   . Pneumonia   . Renal disorder   . 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 A/V FISTULAGRAM Left 01/17/2018   Procedure: A/V Fistulagram;  Surgeon: Serafina Mitchell, MD;  Location: [facility];  Service: Cardiovascular;  Laterality: Left;  . AV FISTULA PLACEMENT Left 12/23/2017   Procedure: ARTERIOVENOUS (AV) FISTULA CREATION  LEFT ARM;  Surgeon: Rosetta Posner, MD;  Location: [facility];  Service: Vascular;  Laterality: Left;  . ECTOPIC PREGNANCY SURGERY  2018  . INSERTION OF DIALYSIS CATHETER Right 01/17/2018   Procedure: Insertion Of Dialysis Catheter;  Surgeon: Serafina Mitchell, MD;  Location: [facility];  Service: Cardiovascular;  Laterality: Right;  . LIGATION OF ARTERIOVENOUS  FISTULA Left 01/19/2018   Procedure: LIGATION OF ARTERIOVENOUS  FISTULA left  ARM;  Surgeon: Serafina Mitchell, MD;  Location: [facility];  Service: Vascular;  Laterality: Left;  . REPAIR OF ACUTE ASCENDING THORACIC AORTIC DISSECTION      Family History  Family History  Problem Relation Age of Onset  . Hypertension Mother    Social History  reports that she has quit smoking. Her smoking use included cigarettes. She has a 2.00 pack-year smoking history. She has never used smokeless tobacco. She reports that she drank alcohol. She reports that she has current or past drug history. Drugs: Cocaine and Benzodiazepines. Allergies No Known Allergies Home medications Prior to Admission medications   Medication Sig Start Date End Date Taking? Authorizing Provider  amLODipine (NORVASC) 10 MG tablet Take 1 tablet (10 mg total) by mouth daily. for high blood pressure 01/23/18  Yes Gherghe, Vella Redhead, MD  cloNIDine (CATAPRES) 0.2 MG tablet Take 1 tablet (0.2 mg total) by mouth 3 (three) times daily. 01/23/18  Yes Caren Griffins, MD  hydrALAZINE (APRESOLINE) 100 MG tablet Take 1 tablet (100 mg total) by mouth 3 (three) times daily. 01/23/18  Yes Caren Griffins, MD  isosorbide mononitrate (IMDUR) 120 MG 24 hr tablet Take 1 tablet (120 mg total) by mouth daily. 01/23/18  Yes Caren Griffins, MD  sodium bicarbonate 650 MG tablet Take 1 tablet (650 mg total) by mouth 2 (two) times daily. 01/23/18  Yes Caren Griffins, MD  calcitRIOL (ROCALTROL) 0.25 MCG capsule Take 1 capsule (0.25 mcg total) by mouth daily. Patient  not taking: Reported on 02/14/2018 01/23/18   Caren Griffins, MD  ferrous sulfate 325 (65 FE) MG tablet Take 1 tablet (325 mg total) by mouth 2 (two) times daily with a meal. Patient not taking: Reported on 02/18/2018 01/23/18   Caren Griffins, MD  labetalol (NORMODYNE) 200 MG tablet Take 1 tablet (200 mg total) by mouth 2 (two) times daily. Patient not taking: Reported on 02/18/2018 01/23/18   Caren Griffins, MD   Liver Function Tests Recent Labs  Lab 02/18/18 1309  AST 17  ALT 9  ALKPHOS 169*  BILITOT 0.6  PROT 6.8  ALBUMIN 3.6   No results for input(s): LIPASE, AMYLASE in the last 168 hours. CBC Recent Labs  Lab  02/17/18 2253 02/18/18 1309 02/18/18 1316  WBC 8.5 7.8  --   NEUTROABS  --  5.5  --   HGB 10.5* 9.7* 10.9*  HCT 33.6* 30.5* 32.0*  MCV 87.7 88.9  --   PLT 186 148*  --    Basic Metabolic Panel Recent Labs  Lab 02/17/18 2253 02/18/18 1309 02/18/18 1316 02/18/18 2344  NA 137 136 137  --   K 4.2 3.6 3.7  --   CL 102 103 105  --   CO2 20* 20*  --   --   GLUCOSE 105* 115* 112*  --   BUN 30* 29* 32*  --   CREATININE 4.23* 4.29* 4.60*  --   CALCIUM 8.5* 8.4*  --   --   PHOS  --   --   --  4.8*   Iron/TIBC/Ferritin/ %Sat    Component Value Date/Time   IRON 14 (L) 09/13/2017 1445   TIBC 297 09/13/2017 1445   FERRITIN 43 02/18/2018 2344   IRONPCTSAT 5 (L) 09/13/2017 1445    Vitals:   02/19/18 0341 02/19/18 0405 02/19/18 0435 02/19/18 0505  BP: (!) 166/104 (!) 172/106 (!) 166/100 (!) 171/96  Pulse:      Resp:      Temp:      TempSrc:      SpO2:      Weight:      Height:       Exam Gen alert, no distress, small framed AAF, no distress No rash, cyanosis or gangrene Sclera anicteric, throat clear  No jvd or bruits Chest clear bilat RRR no MRG Abd soft ntnd no mass or ascites +bs GU defer MS no joint effusions or deformity Ext no LE or UE edema, no wounds or ulcers Neuro is alert, Ox 3 , nf Old Left arm AVF, R IJ TDC    Home meds:  - amlodipine 10 qd/ clonidine 0.2 tid/ hydralazine 100 tid (?) / labetalol 200 bid (?)  - calcitriol 0.25 qd/ fe so4 bid  Dialysis: [facility]  4h   350/800   47.5kg  2/2 bath  R IJ TDC   Hep 4600  - hect 4 ug tiw  - no esa   Impression: 1. HTN'sive crisis - long hx of problems taking/ getting meds.  Feeling better w/ BP's down, taking po meds w/o difficulty.  Getting norvasc, clonidine, labetalol and hydralazine here.  2. Chest pain / hx type A aortic dissection (2018) rx'd surgically - CTA last night w/o any acute changes. CP better w/ BP control.  3. Volume - is up 2kg today, no pulm edema 4. ESRD on HD TTS - missed HD  yest 5. H/o TTP 6. H/o substance abuse   Plan - short HD  tomorrow, then resume TTS on Tuesday  Rob [place] MD [place] pager [phone]   02/19/2018, 8:50 AM

## 2018-02-19 NOTE — Progress Notes (Signed)
ANTICOAGULATION CONSULT NOTE - Initial Consult  Pharmacy Consult for Heparin Indication: chest pain/ACS  No Known Allergies  Patient Measurements: Height: 5\' 3"  (160 cm) Weight: 110 lb 1.6 oz (49.9 kg) IBW/kg (Calculated) : 52.4   Vital Signs: Temp: 98.2 F (36.8 C) (10/05 2121) Temp Source: Oral (10/05 2121) BP: 152/95 (10/05 2121) Pulse Rate: 78 (10/05 2121)  Labs: Recent Labs    02/17/18 2253 02/18/18 1309 02/18/18 1316 02/18/18 2344  HGB 10.5* 9.7* 10.9*  --   HCT 33.6* 30.5* 32.0*  --   PLT 186 148*  --   --   CREATININE 4.23* 4.29* 4.60*  --   TROPONINI  --   --   --  0.27*    Estimated Creatinine Clearance: 14.2 mL/min (A) (by C-G formula based on SCr of 4.6 mg/dL (H)).   Medical History: Past Medical History:  Diagnosis Date  . Aortic dissection ([place])   . CHF (congestive heart failure) ([place])   . CKD (chronic kidney disease) stage 4, GFR 15-29 ml/min (H[facility])   . H/O: alcohol abuse   . Hypertension   . Pneumonia   . Renal disorder   . TTP (thrombotic thrombocytopenic purpura) (H[facility])     Medications:  Medications Prior to Admission  Medication Sig Dispense Refill Last Dose  . amLODipine (NORVASC) 10 MG tablet Take 1 tablet (10 mg total) by mouth daily. for high blood pressure 30 tablet 1 02/14/2018  . cloNIDine (CATAPRES) 0.2 MG tablet Take 1 tablet (0.2 mg total) by mouth 3 (three) times daily. 90 tablet 1 02/18/2018 at Unknown time  . hydrALAZINE (APRESOLINE) 100 MG tablet Take 1 tablet (100 mg total) by mouth 3 (three) times daily. 90 tablet 1 02/18/2018 at Unknown time  . isosorbide mononitrate (IMDUR) 120 MG 24 hr tablet Take 1 tablet (120 mg total) by mouth daily. 30 tablet 1 02/14/2018  . sodium bicarbonate 650 MG tablet Take 1 tablet (650 mg total) by mouth 2 (two) times daily. 60 tablet 1 02/14/2018  . calcitRIOL (ROCALTROL) 0.25 MCG capsule Take 1 capsule (0.25 mcg total) by mouth daily. (Patient not taking: Reported on 02/14/2018) 30 capsule 1 Not  Taking at Unknown time  . ferrous sulfate 325 (65 FE) MG tablet Take 1 tablet (325 mg total) by mouth 2 (two) times daily with a meal. (Patient not taking: Reported on 02/18/2018) 60 tablet 1 Not Taking at Unknown time  . labetalol (NORMODYNE) 200 MG tablet Take 1 tablet (200 mg total) by mouth 2 (two) times daily. (Patient not taking: Reported on 02/18/2018) 60 tablet 1 Not Taking at Unknown time    Assessment: 32 y.o. F presents with. Found to have trop up to 0.27 and pharmacy consulted to start heparin for ACS. Hgb low (10.9) and plt 148. No AC PTA. Pt with ESRD (on HD).   Goal of Therapy:  Heparin level 0.3-0.7 units/ml Monitor platelets by anticoagulation protocol: Yes   Plan:  Heparin IV bolus 3000 units Heparin gtt at 600 units/hr Will f/u heparin level in 8 hours Daily heparin level and CBC  Sherlon Handing, PharmD, BCPS Clinical pharmacist  **Pharmacist phone directory can now be found on [email or web address] ([email or web address]).  Listed under [place]. 02/19/2018,3:01 AM

## 2018-02-19 NOTE — Progress Notes (Signed)
   Subjective: Patient is doing well this morning. She again reiterates that her chest pain started two days prior to admission initially with exertion, and then again during hemodialysis on 10/5. This morning she is chest pain free. She was able to ambulate this morning to the bathroom without shortness of breath or chest pain. We discussed that we are awaiting an echocardiogram and will consult nephrology for dialysis. She voices understanding. All questions and concerns addressed.  Objective: Vital signs in last 24 hours: Vitals:   02/19/18 0341 02/19/18 0405 02/19/18 0435 02/19/18 0505  BP: (!) 166/104 (!) 172/106 (!) 166/100 (!) 171/96  Pulse:      Resp:      Temp:      TempSrc:      SpO2:      Weight:      Height:       General: Well nourished female in no acute distress Pulm: Good air movement with no wheezing or crackles  CV: RRR, systolic murmur Abdomen: Soft, non-distended, no tenderness to palpation  Extremities: Pulses palpable in all extremities, no LE edema  Skin: Warm and dry  Neuro: Alert and oriented x 3  Assessment/Plan:  Valerie Mathis is a 32 y.o female with a history of 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   Hypertensive Emergency  Chest Pain  - Patient now chest pain free  - EKG reviewed. T wave inversion in leads I and aVL which is stable. No R waves in leads III and aVF which is likely 2/2 to lead placement. Otherwise no new conduction abnormalities.  - Troponin trend: 0.27 -> 0.27 - TTE ordered/pending  - A large portion of this is 2/2 to volume status and should improve with HD  - Continue amlodipine 10 mg QD, clonidine 0.2 mg TID, and labetalol 100 mg BID - Restart hydralazine 100 mg TID - Cardiology consulted and has signed off, appreciate their recs  ESRD on HD (TRS)   - 2/2 chronic HTN and substance use  - Last complete session on 10/03 - Labs reviewed from admission. NAGMA and minorly elevated phos but otherwise stable  - Nephrology onboard, Dr. Jonnie Finner   Substance Use Disorder  - Checking UDS   Iron Deficiency Anemia  - Hgb 10.9  - Recent iron panel with an iron sat of 5% and ferritin of 43  - Continue iron replacement therapy, no indication for EPO  Dispo: Anticipated discharge once TTE has been read. Pending results may be stable for discharge today.   Ina Homes, MD 02/19/2018, 6:29 AM

## 2018-02-19 NOTE — Progress Notes (Signed)
Consult for med needs. Spoke w patient who states that she was unaware she had Medicaid. She states "Baby, I'm good." No other CM needs at this time.

## 2018-02-19 NOTE — Anesthesia Preprocedure Evaluation (Addendum)
Anesthesia Evaluation  Patient identified by MRN, date of birth, ID band Patient awake    Reviewed: Allergy & Precautions, NPO status , Patient's Chart, lab work & pertinent test results  History of Anesthesia Complications Negative for: history of anesthetic complications  Airway Mallampati: I  TM Distance: >3 FB Neck ROM: Full    Dental  (+) Dental Advisory Given, Teeth Intact   Pulmonary former smoker,    breath sounds clear to auscultation       Cardiovascular hypertension, Pt. on medications and Pt. on home beta blockers +CHF  + Valvular Problems/Murmurs  Rhythm:Regular Rate:Normal + Systolic murmurs  Ascending thoracic aortic dissection s/p repair  '19 TTE - Severe concentric hypertrophy. EF 60% to 65%. Grade 2 diastolic dysfunction. Moderate TR. PASP mildly increased at 38 mmHg    Neuro/Psych negative neurological ROS  negative psych ROS   GI/Hepatic negative GI ROS, (+)     substance abuse  alcohol use and cocaine use,   Endo/Other  negative endocrine ROS  Renal/GU ESRF and DialysisRenal disease  negative genitourinary   Musculoskeletal negative musculoskeletal ROS (+)   Abdominal   Peds  Hematology  (+) anemia ,  TTP    Anesthesia Other Findings   Reproductive/Obstetrics                            Anesthesia Physical Anesthesia Plan  ASA: IV  Anesthesia Plan: MAC   Post-op Pain Management:    Induction: Intravenous  PONV Risk Score and Plan: 2 and Propofol infusion and Treatment may vary due to age or medical condition  Airway Management Planned: Nasal Cannula and Natural Airway  Additional Equipment: None  Intra-op Plan:   Post-operative Plan:   Informed Consent: I have reviewed the patients History and Physical, chart, labs and discussed the procedure including the risks, benefits and alternatives for the proposed anesthesia with the patient or authorized  representative who has indicated his/her understanding and acceptance.     Plan Discussed with: CRNA and Anesthesiologist  Anesthesia Plan Comments: (LMA as needed)       Anesthesia Quick Evaluation

## 2018-02-19 NOTE — Progress Notes (Signed)
  Echocardiogram 2D Echocardiogram has been performed.  Staley Lunz L Androw 02/19/2018, 9:44 AM

## 2018-02-19 NOTE — Progress Notes (Signed)
Progress Note  Patient Name: Valerie Mathis Date of Encounter: 02/19/2018  Primary Cardiologist: Sanda Klein, MD   Subjective   Chest pain improved, now chest pain-free.  Better blood pressure control.  Appears comfortable laying flat in bed.  Does not appear to be hypervolemic.  No syncope, no bleeding  Inpatient Medications    Scheduled Meds: . amLODipine  10 mg Oral Daily  . aspirin EC  325 mg Oral Daily  . atorvastatin  80 mg Oral q1800  . cloNIDine  0.2 mg Oral TID  . labetalol  100 mg Oral BID  . sodium bicarbonate  650 mg Oral BID   Continuous Infusions:  PRN Meds: acetaminophen **OR** acetaminophen   Vital Signs    Vitals:   02/19/18 0341 02/19/18 0405 02/19/18 0435 02/19/18 0505  BP: (!) 166/104 (!) 172/106 (!) 166/100 (!) 171/96  Pulse:      Resp:      Temp:      TempSrc:      SpO2:      Weight:      Height:        Intake/Output Summary (Last 24 hours) at 02/19/2018 0820 Last data filed at 02/19/2018 0600 Gross per 24 hour  Intake 161.11 ml  Output 300 ml  Net -138.89 ml   Filed Weights   02/18/18 2121  Weight: 49.9 kg    Telemetry    Sinus rhythm, no adverse arrhythmias- Personally Reviewed  ECG    02/19/2018-sinus rhythm with LVH repolarization abnormalities, nonspecific ST-T wave changes personally interpreted- Personally Reviewed  Physical Exam   GEN: No acute distress.   Neck: No JVD Cardiac: RRR, 2/6 systolic murmur right upper sternal border, no rubs, or gallops.  Respiratory: Clear to auscultation bilaterally. GI: Soft, nontender, non-distended  MS: No edema; No deformity. Neuro:  Nonfocal  Psych: Normal affect   Labs    Chemistry Recent Labs  Lab 02/17/18 2253 02/18/18 1309 02/18/18 1316  NA 137 136 137  K 4.2 3.6 3.7  CL 102 103 105  CO2 20* 20*  --   GLUCOSE 105* 115* 112*  BUN 30* 29* 32*  CREATININE 4.23* 4.29* 4.60*  CALCIUM 8.5* 8.4*  --   PROT  --  6.8  --   ALBUMIN  --  3.6  --   AST  --  17  --   ALT   --  9  --   ALKPHOS  --  169*  --   BILITOT  --  0.6  --   GFRNONAA 13* 13*  --   GFRAA 15* 15*  --   ANIONGAP 15 13  --      Hematology Recent Labs  Lab 02/17/18 2253 02/18/18 1309 02/18/18 1316  WBC 8.5 7.8  --   RBC 3.83* 3.43*  --   HGB 10.5* 9.7* 10.9*  HCT 33.6* 30.5* 32.0*  MCV 87.7 88.9  --   MCH 27.4 28.3  --   MCHC 31.3 31.8  --   RDW 16.0* 16.3*  --   PLT 186 148*  --     Cardiac Enzymes Recent Labs  Lab 02/18/18 2344  TROPONINI 0.27*    Recent Labs  Lab 02/17/18 2301 02/18/18 1314 02/18/18 1650  TROPIPOC 0.02 0.12* 0.16*     BNPNo results for input(s): BNP, PROBNP in the last 168 hours.   DDimer No results for input(s): DDIMER in the last 168 hours.   Radiology    Dg Chest 2 View  Result Date: 02/17/2018 CLINICAL DATA:  Chest pain. EXAM: CHEST - 2 VIEW COMPARISON:  Radiographs 12/18/2017 FINDINGS: Dual lumen chest port with tip at the atrial caval junction. Post median sternotomy. Decreased cardiomegaly from prior exam. Unchanged mediastinal contours. Persistent but improved small right pleural effusion. No focal airspace disease or pneumothorax. No pulmonary edema. No acute osseous abnormalities. IMPRESSION: 1. Slight decreased cardiomegaly and right pleural effusion from prior exam. 2. Dual lumen right central line with tip at the atrial caval junction. Electronically Signed   By: Keith Rake M.D.   On: 02/17/2018 23:28   Dg Chest Port 1 View  Result Date: 02/18/2018 CLINICAL DATA:  Mid chest pain, shortness of Breath EXAM: PORTABLE CHEST 1 VIEW COMPARISON:  02/17/2018 FINDINGS: Right dialysis catheter remains in place, unchanged. Prior median sternotomy. Cardiomegaly. Prominent aortic arch. Cannot exclude aortic aneurysm. This appears stable when compared to prior study. No confluent opacities or effusions. No acute bony abnormality. IMPRESSION: Mild cardiomegaly. Prominent aortic arch, cannot exclude aortic aneurysm. No acute cardiopulmonary  disease. Electronically Signed   By: Rolm Baptise M.D.   On: 02/18/2018 13:28   Ct Angio Chest/abd/pel For Dissection W And/or W/wo  Result Date: 02/18/2018 CLINICAL DATA:  32 year old female with chest pain yesterday and left AMA. While in dialysis, patient began to experience chest pain and dyspnea. Elevated troponin levels. 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 EXAM: CT ANGIOGRAPHY CHEST, ABDOMEN AND PELVIS TECHNIQUE: Multidetector CT imaging through the chest, abdomen and pelvis was performed using the standard protocol during bolus administration of intravenous contrast. Multiplanar reconstructed images and MIPs were obtained and reviewed to evaluate the vascular anatomy. CONTRAST:  28mL ISOVUE-370 IOPAMIDOL (ISOVUE-370) INJECTION 76% COMPARISON:  CT abdomen and pelvis 01/14/2018, MRA of the chest 11/29/2017 FINDINGS: CTA CHEST FINDINGS Cardiovascular: The unenhanced images through the chest demonstrate no mural hematoma. Postop change from prior dissection repair along the ascending thoracic aorta and proximal aortic arch. Allowing for differences in technique from the MRA,, redemonstration chronic dissection involving the aortic arch and descending thoracic aorta. The great vessels are fat from the true lumen seen along the posteromedial aspect of the dissection flap. The false lumen is seen anteriorly and laterally. As measured from prior exam, the arch measures up to 4.4 cm in caliber and is unchanged. The dissection flap appears fenestrated along its distal aspect allowing communication between the false and true lumen. There may be a small ulcerated plaque or separate small dissection flap along the distal descending thoracic aorta, previously noted on 01/14/2018 CT abdomen and pelvis. The thoracic aorta measures 3.4 cm in maximum transverse dimension versus 3.2 cm previously.  Dialysis catheter noted with tip in the right atrium. Left ventricular hypertrophy. Mediastinum/Nodes: No lymphadenopathy.  No mediastinal hematoma. Lungs/Pleura: Interval decrease in right-sided pleural effusion now trace on the right. Passive atelectasis noted at each lung base. Subsegmental atelectasis and/or scarring is seen in the left lower lobe. Musculoskeletal: Diffuse osteosclerosis appearance of the thoracic spine compatible with changes of chronic renal osteodystrophy. Review of the MIP images confirms the above findings. CTA ABDOMEN AND PELVIS FINDINGS VASCULAR Aorta: Normal caliber aorta without aneurysm, dissection, vasculitis or significant stenosis. Celiac: Patent without evidence of aneurysm, dissection, vasculitis or significant stenosis. SMA: Patent without evidence of aneurysm, dissection, vasculitis or significant stenosis. Renals: Both renal arteries are patent without evidence of aneurysm, dissection, vasculitis, fibromuscular dysplasia or significant stenosis. IMA: Patent without  evidence of aneurysm, dissection, vasculitis or significant stenosis. Inflow: Patent without evidence of aneurysm, dissection, vasculitis or significant stenosis. Veins: No obvious venous abnormality within the limitations of this arterial phase study. Review of the MIP images confirms the above findings. NON-VASCULAR Hepatobiliary: No focal liver abnormality is seen. Tiny layering gallstones are noted within the gallbladder without secondary signs of acute cholecystitis. Pancreas: Unremarkable. No pancreatic ductal dilatation or surrounding inflammatory changes. Spleen: Normal in size without focal abnormality. Adrenals/Urinary Tract: No adrenal mass identified. No significant renal cortical scarring. No dominant mass of either kidney. No obstructive uropathy. Physiologically distended urinary bladder. No focal mural thickening or calculus noted within. Stomach/Bowel: The appendix is not confidently identified but  no pericecal inflammation is noted to suggest acute appendicitis. Moderate stool retention is noted within the right colon. No bowel obstruction or inflammation. The stomach is decompressed in appearance. Normal small bowel rotation is noted. Lymphatic: No lymphadenopathy by CT size criteria. Reproductive: Left adnexal 5.8 x 4 x 5 cm cyst which could be further correlated with ultrasound. Uterus and right adnexa appear unremarkable. Other: No free air nor free fluid. Musculoskeletal: Diffuse sclerotic appearance of the dorsal spine compatible with chronic renal osteodystrophy. No acute nor aggressive osseous lesions. Review of the MIP images confirms the above findings. IMPRESSION: Vascular: 1. Stable remote postop change status post ascending thoracic aortic dissection repair. 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 2. Small ulcerated plaque or tiny focal distal descending thoracic aortic chronic dissection along the left lateral wall, series 8/78 versus series 3/5 of prior CT abdomen. No significant change. 3. Cardiomegaly with left ventricular hypertrophy is redemonstrated. 4. Nonaneurysmal abdominal aorta without dissection. Patent branch vessels. Nonvascular: 1. Uncomplicated cholelithiasis. 2. Stigmata of chronic renal osteodystrophy. 3. Left adnexal cyst measuring 5.8 x 4 x 5 cm. This could be further correlated with ultrasound of the pelvis. Electronically Signed   By: Ashley Royalty M.D.   On: 02/18/2018 17:14    Cardiac Studies   Echocardiogram 11/30/2017-EF 55 to 16%, grade 2 diastolic dysfunction, trivial mitral regurgitation, left atrium dilated, pulmonary pressures 42 mmHg.  Patient Profile     32 y.o. female with stable ascending aortic aneurysm repair with chronic type B aortic dissection  with no change in CT scan, arch 4.4 cm, left ventricular hypertrophy secondary to hypertension here with hypertensive urgency, elevated troponin  Assessment & Plan    Status post type a aortic dissection repair, chronic type B aortic dissection -Stable on CT scan. - Blood pressure control.  Medications were reviewed.  Hypertensive urgency - Minimally elevated troponin secondary to severe hypotension and underlying end-stage renal disease.  No evidence of acute coronary syndrome. - Continue to manage blood pressure. -No evidence of renal artery stenosis on 09/14/2017 vascular renal arterial ultrasound. - Amlodipine 10, clonidine 0.2 3 times daily, labetalol 100 twice daily has been started.  Elevated troponin - Likely combination of demand ischemia and chronic renal disease.  Appears low level, flat, no evidence of acute coronary syndrome.  Although this does not represent acute coronary syndrome, and elevated troponin even in the setting of end-stage renal disease portends a worsened prognosis.  End-stage renal disease on hemodialysis with uncontrolled blood pressure - Appreciate nephrology's input.  History of polysubstance abuse - 5 separate urine tox screens from 4/30 to 8/31  were positive for cocaine.  Atypical chest pain -Currently chest pain-free.  Feeling better with improved blood pressure control. - Personally reviewed CT scan, there is no evidence of coronary calcification.  Likelihood of significant coronary disease is low.  No further invasive/noninvasive cardiac testing warranted. - I would discontinue the heparin IV.  I do not currently see it on the medication list however it is still running.  I will write a nursing order.    [facility] will sign off.   Medication Recommendations: Continue with blood pressure control Other recommendations (labs, testing, etc): No further testing Follow up as an outpatient: As previously scheduled  For questions or updates, please  contact [place] Please consult [email or web address] for contact info under        Signed, Candee Furbish, MD  02/19/2018, 8:20 AM

## 2018-02-20 ENCOUNTER — Encounter (HOSPITAL_COMMUNITY)
Admission: EM | Disposition: A | Discharge status: Home / Self Care | Payer: Self-pay | Source: Home / Self Care | Attending: Emergency Medicine

## 2018-02-20 ENCOUNTER — Encounter (HOSPITAL_COMMUNITY): Payer: Self-pay | Admitting: Certified Registered"

## 2018-02-20 ENCOUNTER — Telehealth: Payer: Self-pay | Admitting: Vascular Surgery

## 2018-02-20 ENCOUNTER — Ambulatory Visit: Admit: 2018-02-20 | Payer: MEDICAID | Admitting: Vascular Surgery

## 2018-02-20 ENCOUNTER — Observation Stay (HOSPITAL_COMMUNITY): Payer: Medicaid Other | Admitting: Physician Assistant

## 2018-02-20 DIAGNOSIS — I120 Hypertensive chronic kidney disease with stage 5 chronic kidney disease or end stage renal disease: Secondary | ICD-10-CM

## 2018-02-20 DIAGNOSIS — Z992 Dependence on renal dialysis: Secondary | ICD-10-CM | POA: Diagnosis not present

## 2018-02-20 DIAGNOSIS — I161 Hypertensive emergency: Secondary | ICD-10-CM | POA: Diagnosis not present

## 2018-02-20 DIAGNOSIS — I7101 Dissection of thoracic aorta: Secondary | ICD-10-CM | POA: Diagnosis not present

## 2018-02-20 DIAGNOSIS — F1410 Cocaine abuse, uncomplicated: Secondary | ICD-10-CM

## 2018-02-20 DIAGNOSIS — N186 End stage renal disease: Secondary | ICD-10-CM | POA: Diagnosis not present

## 2018-02-20 DIAGNOSIS — N185 Chronic kidney disease, stage 5: Secondary | ICD-10-CM

## 2018-02-20 DIAGNOSIS — I132 Hypertensive heart and chronic kidney disease with heart failure and with stage 5 chronic kidney disease, or end stage renal disease: Secondary | ICD-10-CM | POA: Diagnosis not present

## 2018-02-20 DIAGNOSIS — I5032 Chronic diastolic (congestive) heart failure: Secondary | ICD-10-CM | POA: Diagnosis not present

## 2018-02-20 DIAGNOSIS — R079 Chest pain, unspecified: Secondary | ICD-10-CM | POA: Diagnosis not present

## 2018-02-20 HISTORY — PX: BASCILIC VEIN TRANSPOSITION: SHX5742

## 2018-02-20 LAB — CBC
HEMATOCRIT: 26.9 % — AB (ref 36.0–46.0)
HEMOGLOBIN: 8.3 g/dL — AB (ref 12.0–15.0)
MCH: 27.7 pg (ref 26.0–34.0)
MCHC: 30.9 g/dL (ref 30.0–36.0)
MCV: 89.7 fL (ref 78.0–100.0)
Platelets: 148 10*3/uL — ABNORMAL LOW (ref 150–400)
RBC: 3 MIL/uL — ABNORMAL LOW (ref 3.87–5.11)
RDW: 16.8 % — AB (ref 11.5–15.5)
WBC: 5.8 10*3/uL (ref 4.0–10.5)

## 2018-02-20 LAB — SURGICAL PCR SCREEN
MRSA, PCR: NEGATIVE
Staphylococcus aureus: POSITIVE — AB

## 2018-02-20 LAB — RAPID URINE DRUG SCREEN, HOSP PERFORMED
Amphetamines: NOT DETECTED
BARBITURATES: NOT DETECTED
Benzodiazepines: NOT DETECTED
Cocaine: POSITIVE — AB
OPIATES: NOT DETECTED
TETRAHYDROCANNABINOL: NOT DETECTED

## 2018-02-20 LAB — RENAL FUNCTION PANEL
Albumin: 2.9 g/dL — ABNORMAL LOW (ref 3.5–5.0)
Anion gap: 10 (ref 5–15)
BUN: 39 mg/dL — AB (ref 6–20)
CHLORIDE: 105 mmol/L (ref 98–111)
CO2: 21 mmol/L — AB (ref 22–32)
CREATININE: 5.52 mg/dL — AB (ref 0.44–1.00)
Calcium: 7.5 mg/dL — ABNORMAL LOW (ref 8.9–10.3)
GFR calc Af Amer: 11 mL/min — ABNORMAL LOW (ref >60)
GFR calc non Af Amer: 10 mL/min — ABNORMAL LOW (ref >60)
Glucose, Bld: 114 mg/dL — ABNORMAL HIGH (ref 70–99)
POTASSIUM: 3.8 mmol/L (ref 3.5–5.1)
Phosphorus: 5.6 mg/dL — ABNORMAL HIGH (ref 2.5–4.6)
Sodium: 136 mmol/L (ref 135–145)

## 2018-02-20 LAB — HCG, SERUM, QUALITATIVE: PREG SERUM: NEGATIVE

## 2018-02-20 SURGERY — TRANSPOSITION, VEIN, BASILIC
Anesthesia: Monitor Anesthesia Care | Site: Arm Upper | Laterality: Right

## 2018-02-20 MED ORDER — HEPARIN SODIUM (PORCINE) 1000 UNIT/ML IJ SOLN
INTRAMUSCULAR | Status: DC | PRN
  Administered 2018-02-20: 3000 [IU] via INTRAVENOUS

## 2018-02-20 MED ORDER — CHLORHEXIDINE GLUCONATE CLOTH 2 % EX PADS
6.0000 | MEDICATED_PAD | Freq: Every day | CUTANEOUS | Status: DC
  Administered 2018-02-21: 6 via TOPICAL

## 2018-02-20 MED ORDER — MIDAZOLAM HCL 2 MG/2ML IJ SOLN
INTRAMUSCULAR | Status: AC
  Filled 2018-02-20: qty 2

## 2018-02-20 MED ORDER — ISOSORBIDE MONONITRATE ER 60 MG PO TB24
120.0000 mg | ORAL_TABLET | Freq: Every day | ORAL | Status: DC
  Administered 2018-02-21: 120 mg via ORAL
  Filled 2018-02-20: qty 2

## 2018-02-20 MED ORDER — HYDROCODONE-ACETAMINOPHEN 5-325 MG PO TABS
1.0000 | ORAL_TABLET | Freq: Four times a day (QID) | ORAL | Status: DC | PRN
  Administered 2018-02-20 – 2018-02-21 (×3): 2 via ORAL
  Filled 2018-02-20 (×2): qty 2

## 2018-02-20 MED ORDER — MIDAZOLAM HCL 5 MG/5ML IJ SOLN
INTRAMUSCULAR | Status: DC | PRN
  Administered 2018-02-20: 2 mg via INTRAVENOUS

## 2018-02-20 MED ORDER — SODIUM CHLORIDE 0.9 % IV SOLN: 100.0000 mL | INTRAVENOUS | Status: DC | PRN

## 2018-02-20 MED ORDER — SODIUM CHLORIDE 0.9 % IV SOLN
125.0000 mg | INTRAVENOUS | Status: DC
  Administered 2018-02-21: 125 mg via INTRAVENOUS
  Filled 2018-02-20 (×2): qty 10

## 2018-02-20 MED ORDER — HYDROCODONE-ACETAMINOPHEN 5-325 MG PO TABS: 1.0000 | ORAL_TABLET | Freq: Four times a day (QID) | ORAL | 0 refills | Status: DC | PRN

## 2018-02-20 MED ORDER — HEPARIN SODIUM (PORCINE) 1000 UNIT/ML IJ SOLN
INTRAMUSCULAR | Status: AC
  Administered 2018-02-20: 1000 [IU]
  Filled 2018-02-20: qty 3

## 2018-02-20 MED ORDER — LIDOCAINE-EPINEPHRINE (PF) 1 %-1:200000 IJ SOLN
INTRAMUSCULAR | Status: DC | PRN
  Administered 2018-02-20: 6 mL

## 2018-02-20 MED ORDER — PROPOFOL 1000 MG/100ML IV EMUL
INTRAVENOUS | Status: AC
  Filled 2018-02-20: qty 100

## 2018-02-20 MED ORDER — FENTANYL CITRATE (PF) 250 MCG/5ML IJ SOLN
INTRAMUSCULAR | Status: AC
  Filled 2018-02-20: qty 5

## 2018-02-20 MED ORDER — MORPHINE SULFATE (PF) 2 MG/ML IV SOLN: 2.0000 mg | INTRAVENOUS | Status: AC | PRN

## 2018-02-20 MED ORDER — LIDOCAINE HCL (PF) 1 % IJ SOLN: 5.0000 mL | INTRAMUSCULAR | Status: DC | PRN

## 2018-02-20 MED ORDER — PROPOFOL 10 MG/ML IV BOLUS
INTRAVENOUS | Status: AC
  Filled 2018-02-20: qty 20

## 2018-02-20 MED ORDER — LIDOCAINE-EPINEPHRINE (PF) 1 %-1:200000 IJ SOLN
INTRAMUSCULAR | Status: AC
  Filled 2018-02-20: qty 30

## 2018-02-20 MED ORDER — HEPARIN SODIUM (PORCINE) 1000 UNIT/ML IJ SOLN
INTRAMUSCULAR | Status: AC
  Filled 2018-02-20: qty 1

## 2018-02-20 MED ORDER — FENTANYL CITRATE (PF) 100 MCG/2ML IJ SOLN
INTRAMUSCULAR | Status: DC | PRN
  Administered 2018-02-20: 50 ug via INTRAVENOUS

## 2018-02-20 MED ORDER — PENTAFLUOROPROP-TETRAFLUOROETH EX AERO: 1.0000 "application " | INHALATION_SPRAY | CUTANEOUS | Status: DC | PRN

## 2018-02-20 MED ORDER — SODIUM CHLORIDE 0.9 % IJ SOLN
INTRAMUSCULAR | Status: DC | PRN
  Administered 2018-02-20: 50 mL via TOPICAL

## 2018-02-20 MED ORDER — ALTEPLASE 2 MG IJ SOLR: 2.0000 mg | Freq: Once | INTRAMUSCULAR | Status: DC | PRN

## 2018-02-20 MED ORDER — SODIUM CHLORIDE 0.9 % IV SOLN
INTRAVENOUS | Status: AC
  Filled 2018-02-20: qty 1.2

## 2018-02-20 MED ORDER — PROPOFOL 500 MG/50ML IV EMUL
INTRAVENOUS | Status: DC | PRN
  Administered 2018-02-20: 75 ug/kg/min via INTRAVENOUS

## 2018-02-20 MED ORDER — HEPARIN SODIUM (PORCINE) 1000 UNIT/ML IJ SOLN
1700.0000 [IU] | Freq: Once | INTRAMUSCULAR | Status: AC
  Administered 2018-02-20: 1700 [IU] via INTRAVENOUS

## 2018-02-20 MED ORDER — 0.9 % SODIUM CHLORIDE (POUR BTL) OPTIME
TOPICAL | Status: DC | PRN
  Administered 2018-02-20: 1000 mL

## 2018-02-20 MED ORDER — LIDOCAINE-PRILOCAINE 2.5-2.5 % EX CREA: 1.0000 "application " | TOPICAL_CREAM | CUTANEOUS | Status: DC | PRN

## 2018-02-20 MED ORDER — SODIUM CHLORIDE 0.9 % IV SOLN
INTRAVENOUS | Status: DC | PRN
  Administered 2018-02-20: 500 mL

## 2018-02-20 MED ORDER — PAPAVERINE HCL 30 MG/ML IJ SOLN
INTRAMUSCULAR | Status: AC
  Filled 2018-02-20: qty 2

## 2018-02-20 MED ORDER — HEPARIN SODIUM (PORCINE) 1000 UNIT/ML DIALYSIS: 1000.0000 [IU] | INTRAMUSCULAR | Status: DC | PRN

## 2018-02-20 MED ORDER — PROPOFOL 10 MG/ML IV BOLUS
INTRAVENOUS | Status: DC | PRN
  Administered 2018-02-20: 20 mg via INTRAVENOUS
  Administered 2018-02-20: 10 mg via INTRAVENOUS

## 2018-02-20 SURGICAL SUPPLY — 42 items
ARMBAND PINK RESTRICT EXTREMIT (MISCELLANEOUS) ×3 IMPLANT
CANISTER SUCT 3000ML PPV (MISCELLANEOUS) ×3 IMPLANT
CLIP VESOCCLUDE MED 24/CT (CLIP) ×3 IMPLANT
CLIP VESOCCLUDE SM WIDE 24/CT (CLIP) ×3 IMPLANT
COVER PROBE W GEL 5X96 (DRAPES) ×3 IMPLANT
COVER WAND RF STERILE (DRAPES) ×3 IMPLANT
DECANTER SPIKE VIAL GLASS SM (MISCELLANEOUS) ×3 IMPLANT
DERMABOND ADVANCED (GAUZE/BANDAGES/DRESSINGS) ×2
DERMABOND ADVANCED .7 DNX12 (GAUZE/BANDAGES/DRESSINGS) ×1 IMPLANT
ELECT REM PT RETURN 9FT ADLT (ELECTROSURGICAL) ×3
ELECTRODE REM PT RTRN 9FT ADLT (ELECTROSURGICAL) ×1 IMPLANT
GLOVE BIO SURGEON STRL SZ 6.5 (GLOVE) ×2 IMPLANT
GLOVE BIO SURGEON STRL SZ7.5 (GLOVE) ×6 IMPLANT
GLOVE BIO SURGEONS STRL SZ 6.5 (GLOVE) ×1
GLOVE BIOGEL PI IND STRL 6 (GLOVE) ×1 IMPLANT
GLOVE BIOGEL PI IND STRL 6.5 (GLOVE) ×1 IMPLANT
GLOVE BIOGEL PI IND STRL 8 (GLOVE) ×1 IMPLANT
GLOVE BIOGEL PI INDICATOR 6 (GLOVE) ×2
GLOVE BIOGEL PI INDICATOR 6.5 (GLOVE) ×2
GLOVE BIOGEL PI INDICATOR 8 (GLOVE) ×2
GOWN STRL REUS W/ TWL LRG LVL3 (GOWN DISPOSABLE) ×2 IMPLANT
GOWN STRL REUS W/ TWL XL LVL3 (GOWN DISPOSABLE) ×2 IMPLANT
GOWN STRL REUS W/TWL LRG LVL3 (GOWN DISPOSABLE) ×4
GOWN STRL REUS W/TWL XL LVL3 (GOWN DISPOSABLE) ×4
HEMOSTAT SPONGE AVITENE ULTRA (HEMOSTASIS) IMPLANT
KIT BASIN OR (CUSTOM PROCEDURE TRAY) ×3 IMPLANT
KIT TURNOVER KIT B (KITS) ×3 IMPLANT
NEEDLE HYPO 25GX1X1/2 BEV (NEEDLE) ×3 IMPLANT
NS IRRIG 1000ML POUR BTL (IV SOLUTION) ×3 IMPLANT
PACK CV ACCESS (CUSTOM PROCEDURE TRAY) ×3 IMPLANT
PAD ARMBOARD 7.5X6 YLW CONV (MISCELLANEOUS) ×6 IMPLANT
SUT MNCRL AB 4-0 PS2 18 (SUTURE) ×3 IMPLANT
SUT PROLENE 6 0 BV (SUTURE) ×6 IMPLANT
SUT PROLENE 7 0 BV 1 (SUTURE) IMPLANT
SUT SILK 2 0 SH (SUTURE) IMPLANT
SUT VIC AB 2-0 CT1 27 (SUTURE)
SUT VIC AB 2-0 CT1 TAPERPNT 27 (SUTURE) IMPLANT
SUT VIC AB 3-0 SH 27 (SUTURE) ×2
SUT VIC AB 3-0 SH 27X BRD (SUTURE) ×1 IMPLANT
TOWEL GREEN STERILE (TOWEL DISPOSABLE) ×3 IMPLANT
UNDERPAD 30X30 (UNDERPADS AND DIAPERS) ×3 IMPLANT
WATER STERILE IRR 1000ML POUR (IV SOLUTION) ×3 IMPLANT

## 2018-02-20 NOTE — Telephone Encounter (Signed)
sch appt phone NA mld ltr 03/21/18 1215pm f/u MD

## 2018-02-20 NOTE — Progress Notes (Addendum)
   Subjective: Patient was seen when was in HD unit. No chest pain. No sob, no other complaints.   Objective:  Vital signs in last 24 hours: Vitals:   02/20/18 1600 02/20/18 1630 02/20/18 1654 02/20/18 1741  BP: (!) 160/89 (!) 187/88 (!) 184/82 (!) 181/96  Pulse: 79 79 76 85  Resp:  19 16 16   Temp:   (!) 97 F (36.1 C) 99.9 F (37.7 C)  TempSrc:   Oral Oral  SpO2:   100% 100%  Weight:   47.7 kg   Height:       General: Alert and oriented x3, is getting hemodialysis now.  Is not on any acute distress CV: RRR, Nl S1S2.  Lungs: Positioning for lung exam was limited due to ongoing hemodialysis.  Lungs were clear to auscultation without any rails or wheeze on anterior lateral chest wall exam Abdomen: Normal bowel sounds, soft and non-tender Extremities: Incision site at right upper extremity due to procedure today is clean with no bleeding no discharge.  no lower extremity edema  Assessment/Plan:  Principal Problem:   Hypertensive emergency Active Problems:   Dissection of aorta, thoracic (HCC)   Chest pain   Normocytic anemia   ESRD on dialysis [facility]) 32 year old female with history of ascending aortic dissection (chronic type B aortic dissection), status post repair, ESRD on hemodialysis 3 times per week Tuesday Thursday Saturday, polysubstance abuse, presented with chest pain.  Chest pain: With blood pressure of 200/110 when EMS saw patient. Improved with BP controled. Cardiology work-up ruled out acute coronary event . Troponin was mildly elevated but stable. chest pain likely secondary to hypertensive emergency.  She has been symptoms free after controlling blood pressure.  Also history of polysubstance abuse.  -Monitor for symptoms  Hypertensive emergencies:  Currently controled. Echo performed: EF:60-65%. LV with nl cavity size but sever concentric hypertrophy: Chronic hypertension or hypertrophic cardiomyopathy. Cardio was on board -Continue clonidin, hydralazin,  amlodipine.  -Keep Labetalol D/Cd.  -Will monitor BP  Thoracic aorta dissection: Stable on CT scan. Cardiology was on board. -Continue controlling BP  ESRD: Last HD on Saturday was not completed because of her chest pain. S/p "right first stage basilic vein transposition (brachiobasilic arteriovenous fistula) placement" procedure today.Nephro is on board.  Is getting her HD now. -resume regular HD schedule tommorrow -F/u with nephrology   Polysubstance abuse: UDS positive for cocaine.   Dispo: Anticipated discharge tomorrow   Dewayne Hatch, MD 02/20/2018, 6:41 PM Pager: [phone]  Internal Medicine Attending:   I saw and examined the patient. I reviewed the resident's note and I agree with the resident's findings and plan as documented in the resident's note.  Lucious Groves, DO

## 2018-02-20 NOTE — H&P (Signed)
History and Physical Interval Note:  02/20/2018 7:17 AM  Valerie Mathis  has presented today for surgery, with the diagnosis of END STAGE RENAL DISEASE FOR HEMODIALYSIS ACCESS  The various methods of treatment have been discussed with the patient and family. After consideration of risks, benefits and other options for treatment, the patient has consented to  Procedure(s): FIRST STAGE BASILIC VEIN TRANSPOSITION RIGHT ARM (Right) as a surgical intervention .  The patient's history has been reviewed, patient examined, no change in status, stable for surgery.  I have reviewed the patient's chart and labs.  Questions were answered to the patient's satisfaction.    Right upper extremity AVF.  Marty Heck  Patient name: Valerie Mathis   MRN: [id number]        DOB: [birth year]        Sex: female  REASON FOR VISIT: Discuss access for hemodialysis  HPI: Valerie Mathis is a 32 y.o. female in today for discussion of access for hemodialysis.  She has a very complicated past history.  She had emergent cardiac surgery in [facility] for a sending arch dissection.  Had subsequent renal failure.  Also has descending thoracic dissection.  She had placement of a left arm fistula and had complication of a very swollen arm.  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  The fistula was ligated and the right IJ catheter was placed.  She is here today for further discussion.  She has no history of right arm swelling.  Her left arm has returned to normal baseline        Current Outpatient Medications  Medication Sig Dispense Refill  . amLODipine (NORVASC) 10 MG tablet Take 1 tablet (10 mg total) by mouth daily. for high blood pressure 30 tablet 1  . calcitRIOL (ROCALTROL) 0.25 MCG capsule Take 1 capsule (0.25 mcg total) by mouth daily. 30 capsule 1  . cloNIDine (CATAPRES) 0.2 MG tablet Take 1 tablet (0.2 mg total) by mouth 3 (three) times daily. 90 tablet 1  . ferrous sulfate  325 (65 FE) MG tablet Take 1 tablet (325 mg total) by mouth 2 (two) times daily with a meal. 60 tablet 1  . hydrALAZINE (APRESOLINE) 100 MG tablet Take 1 tablet (100 mg total) by mouth 3 (three) times daily. 90 tablet 1  . isosorbide mononitrate (IMDUR) 120 MG 24 hr tablet Take 1 tablet (120 mg total) by mouth daily. 30 tablet 1  . labetalol (NORMODYNE) 200 MG tablet Take 1 tablet (200 mg total) by mouth 2 (two) times daily. 60 tablet 1  . sodium bicarbonate 650 MG tablet Take 1 tablet (650 mg total) by mouth 2 (two) times daily. 60 tablet 1   No current facility-administered medications for this visit.      PHYSICAL EXAM:    Vitals:   02/14/18 1239  BP: (!) 196/124  Pulse: 76  Resp: 18  Temp: 97.9 F (36.6 C)  TempSrc: Oral  SpO2: 100%  Weight: 110 lb (49.9 kg)  Height: 5\' 3"  (1.6 m)    GENERAL: The patient is a well-nourished female, in no acute distress. The vital signs are documented above. Left radial pulses bilaterally.  Small surface veins bilaterally  I imaged her right arm with SonoSite ultrasound.  This shows an usable cephalic vein throughout its course.  She does have a moderate size cephalic vein from the antecubital space proximally.  It is a duplicated vein in the distal upper arm and then becomes a more  dominant single vein above the elbow.  MEDICAL ISSUES: Discussed options with the patient.  Have recommended attempt at right arm AV fistula.  Explained that this would require 2 stages that would see back in the office after the initial brachial artery to basilic vein fistula and if she does have adequate maturation would then do the second stage transposition.  She currently has hemodialysis on Tuesday Thursday and Saturday and so therefore we will coordinate this on a nondialysis day at [facility]   Rosetta Posner, MD [facility] Tel [phone] Pager [phone]

## 2018-02-20 NOTE — Progress Notes (Signed)
Report given to the O/R which included patient status, IV status, positive staph via nasal swab and treatment started, IV access. Patient packed her belongings and are in 2 hospital belonging bags in her room at this time.

## 2018-02-20 NOTE — Care Management Note (Signed)
Case Management Note  Patient Details  Name: Valerie Mathis MRN: [id number] Date of Birth: 04-28-1986  Subjective/Objective: Pt presented for Chest Pain- CM consulted due to patient is high risk for readmission. Pt states she does not have a PCP- she did not realize that she had Medicaid 2/2 mail was going to different address. Pt has had frequent visits to ED. Pt should be able to get medications without any problems and cost should be no more than $3.00.                    Action/Plan: CM stated that her Medicaid Card should have a provider listed on it once she receives it. CM did provide pt with list of Physicians accepting Medicaid. Pt will need to call and schedule if she has not been assigned.   Expected Discharge Date:                  Expected Discharge Plan:  Home/Self Care  In-House Referral:  PCP / [facility]  Discharge planning Services  CM Consult  Post Acute Care Choice:  NA Choice offered to:  NA  DME Arranged:  N/A DME Agency:  NA  HH Arranged:  NA HH Agency:  NA  Status of Service:  Completed, signed off  If discussed at [place] of Stay Meetings, dates discussed:    Additional Comments:  Bethena Roys, RN 02/20/2018, 2:50 PM

## 2018-02-20 NOTE — Transfer of Care (Signed)
Immediate Anesthesia Transfer of Care Note  Patient: Valerie Mathis  Procedure(s) Performed: Brachiocephalic Fistula creation right upper arm (Right Arm Upper)  Patient Location: PACU  Anesthesia Type:MAC  Level of Consciousness: awake and patient cooperative  Airway & Oxygen Therapy: Patient Spontanous Breathing and Patient connected to nasal cannula oxygen  Post-op Assessment: Report given to RN, Post -op Vital signs reviewed and stable and Patient moving all extremities  Post vital signs: Reviewed and stable  Last Vitals:  Vitals Value Taken Time  BP 144/83 02/20/2018  9:17 AM  Temp    Pulse 80 02/20/2018  9:18 AM  Resp 20 02/20/2018  9:18 AM  SpO2 100 % 02/20/2018  9:18 AM  Vitals shown include unvalidated device data.  Last Pain:  Vitals:   02/20/18 0400  TempSrc:   PainSc: Asleep      Patients Stated Pain Goal: 0 (47/82/95 6213)  Complications: No apparent anesthesia complications

## 2018-02-20 NOTE — Progress Notes (Signed)
   02/20/18 1100  Clinical Encounter Type  Visited With Patient  Visit Type Initial  Referral From Nurse  Responded to [facility] consult for AD and patient was up in bed resting well. She indicated that she want an Advance Directive but, at this time, she had to think about the person/persons she would list. I left patient documents to read over and instructed her not to sign until notary and witness are available.

## 2018-02-20 NOTE — Progress Notes (Signed)
Patient to the O/R via bed with chart in stable condition. A mask was placed on her face since her results came back positive for staph and orderly given that information. Patient declined going to the [facility] since she had recently gone.

## 2018-02-20 NOTE — Anesthesia Postprocedure Evaluation (Signed)
Anesthesia Post Note  Patient: Valerie Mathis  Procedure(s) Performed: Brachiocephalic Fistula creation right upper arm (Right Arm Upper)     Patient location during evaluation: PACU Anesthesia Type: MAC Level of consciousness: awake and alert Pain management: pain level controlled Vital Signs Assessment: post-procedure vital signs reviewed and stable Respiratory status: spontaneous breathing, nonlabored ventilation and respiratory function stable Cardiovascular status: stable and blood pressure returned to baseline Anesthetic complications: no    Last Vitals:  Vitals:   02/20/18 1017 02/20/18 1108  BP: (!) 152/86 (!) 149/89  Pulse: 66   Resp: 14 16  Temp:    SpO2: 100% 100%    Last Pain:  Vitals:   02/20/18 0917  TempSrc:   PainSc: [name]

## 2018-02-20 NOTE — Progress Notes (Signed)
   02/20/18 1000  Clinical Encounter Type  Visited With Health care provider  Visit Type Initial;Other (Comment) (AD)  Referral From Nurse   Pt listed as Periop.  Call made to periop and pt is already in surgery.  Will f/u w/ pt after she has left post-anesthesia.  Myra Gianotti resident, [phone]

## 2018-02-20 NOTE — Progress Notes (Addendum)
[facility] Progress Note   Subjective:   Seen in room post OP.  States surgery went well. Denies pain, CP, SOB, and n/v/d.  Just hungry.  Objective Vitals:   02/20/18 1002 02/20/18 1015 02/20/18 1017 02/20/18 1108  BP: (!) 156/86  (!) 152/86 (!) 149/89  Pulse: 69 68 66   Resp: 17 13 14 16   Temp:  40.9 F (36.5 C)    TempSrc:      SpO2: 100% 100% 100% 100%  Weight:      Height:       Physical Exam General:NAD, well appearing female Heart: RRR, no mrg Lungs:CTAB Abdomen:soft, NTND, +BS Extremities:no LE edema Dialysis Access: R IJ TDC, new LU AVF +b/t   Filed Weights   02/18/18 2121 02/20/18 0544  Weight: 49.9 kg 50.2 kg    Intake/Output Summary (Last 24 hours) at 02/20/2018 1118 Last data filed at 02/20/2018 1025 Gross per 24 hour  Intake 450 ml  Output 300 ml  Net 150 ml    Additional Objective Labs: Basic Metabolic Panel: Recent Labs  Lab 02/17/18 2253 02/18/18 1309 02/18/18 1316 02/18/18 2344  NA 137 136 137  --   K 4.2 3.6 3.7  --   CL 102 103 105  --   CO2 20* 20*  --   --   GLUCOSE 105* 115* 112*  --   BUN 30* 29* 32*  --   CREATININE 4.23* 4.29* 4.60*  --   CALCIUM 8.5* 8.4*  --   --   PHOS  --   --   --  4.8*   Liver Function Tests: Recent Labs  Lab 02/18/18 1309  AST 17  ALT 9  ALKPHOS 169*  BILITOT 0.6  PROT 6.8  ALBUMIN 3.6   CBC: Recent Labs  Lab 02/17/18 2253 02/18/18 1309 02/18/18 1316 02/20/18 0021  WBC 8.5 7.8  --  5.8  NEUTROABS  --  5.5  --   --   HGB 10.5* 9.7* 10.9* 8.3*  HCT 33.6* 30.5* 32.0* 26.9*  MCV 87.7 88.9  --  89.7  PLT 186 148*  --  148*   Cardiac Enzymes: Recent Labs  Lab 02/18/18 2344 02/19/18 0645 02/19/18 1138  TROPONINI 0.27* 0.27* 0.23*   Iron Studies:  Recent Labs    02/18/18 2344  FERRITIN 43   Studies/Results: Dg Chest Port 1 View  Result Date: 02/18/2018 CLINICAL DATA:  Mid chest pain, shortness of Breath EXAM: PORTABLE CHEST 1 VIEW COMPARISON:  02/17/2018  FINDINGS: Right dialysis catheter remains in place, unchanged. Prior median sternotomy. Cardiomegaly. Prominent aortic arch. Cannot exclude aortic aneurysm. This appears stable when compared to prior study. No confluent opacities or effusions. No acute bony abnormality. IMPRESSION: Mild cardiomegaly. Prominent aortic arch, cannot exclude aortic aneurysm. No acute cardiopulmonary disease. Electronically Signed   By: Rolm Baptise M.D.   On: 02/18/2018 13:28   Ct Angio Chest/abd/pel For Dissection W And/or W/wo  Result Date: 02/18/2018 CLINICAL DATA:  32 year old female with chest pain yesterday and left AMA. While in dialysis, patient began to experience chest pain and dyspnea. Elevated troponin levels. 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 EXAM: CT ANGIOGRAPHY CHEST, ABDOMEN AND PELVIS TECHNIQUE: Multidetector CT imaging through the chest, abdomen and pelvis was performed using the standard protocol during bolus administration of intravenous contrast. Multiplanar reconstructed images and MIPs were obtained and reviewed to evaluate the vascular anatomy.  CONTRAST:  107mL ISOVUE-370 IOPAMIDOL (ISOVUE-370) INJECTION 76% COMPARISON:  CT abdomen and pelvis 01/14/2018, MRA of the chest 11/29/2017 FINDINGS: CTA CHEST FINDINGS Cardiovascular: The unenhanced images through the chest demonstrate no mural hematoma. Postop change from prior dissection repair along the ascending thoracic aorta and proximal aortic arch. Allowing for differences in technique from the MRA,, redemonstration chronic dissection involving the aortic arch and descending thoracic aorta. The great vessels are fat from the true lumen seen along the posteromedial aspect of the dissection flap. The false lumen is seen anteriorly and laterally. As measured from prior exam, the arch measures up to 4.4 cm in caliber and is unchanged.  The dissection flap appears fenestrated along its distal aspect allowing communication between the false and true lumen. There may be a small ulcerated plaque or separate small dissection flap along the distal descending thoracic aorta, previously noted on 01/14/2018 CT abdomen and pelvis. The thoracic aorta measures 3.4 cm in maximum transverse dimension versus 3.2 cm previously. Dialysis catheter noted with tip in the right atrium. Left ventricular hypertrophy. Mediastinum/Nodes: No lymphadenopathy.  No mediastinal hematoma. Lungs/Pleura: Interval decrease in right-sided pleural effusion now trace on the right. Passive atelectasis noted at each lung base. Subsegmental atelectasis and/or scarring is seen in the left lower lobe. Musculoskeletal: Diffuse osteosclerosis appearance of the thoracic spine compatible with changes of chronic renal osteodystrophy. Review of the MIP images confirms the above findings. CTA ABDOMEN AND PELVIS FINDINGS VASCULAR Aorta: Normal caliber aorta without aneurysm, dissection, vasculitis or significant stenosis. Celiac: Patent without evidence of aneurysm, dissection, vasculitis or significant stenosis. SMA: Patent without evidence of aneurysm, dissection, vasculitis or significant stenosis. Renals: Both renal arteries are patent without evidence of aneurysm, dissection, vasculitis, fibromuscular dysplasia or significant stenosis. IMA: Patent without evidence of aneurysm, dissection, vasculitis or significant stenosis. Inflow: Patent without evidence of aneurysm, dissection, vasculitis or significant stenosis. Veins: No obvious venous abnormality within the limitations of this arterial phase study. Review of the MIP images confirms the above findings. NON-VASCULAR Hepatobiliary: No focal liver abnormality is seen. Tiny layering gallstones are noted within the gallbladder without secondary signs of acute cholecystitis. Pancreas: Unremarkable. No pancreatic ductal dilatation or  surrounding inflammatory changes. Spleen: Normal in size without focal abnormality. Adrenals/Urinary Tract: No adrenal mass identified. No significant renal cortical scarring. No dominant mass of either kidney. No obstructive uropathy. Physiologically distended urinary bladder. No focal mural thickening or calculus noted within. Stomach/Bowel: The appendix is not confidently identified but no pericecal inflammation is noted to suggest acute appendicitis. Moderate stool retention is noted within the right colon. No bowel obstruction or inflammation. The stomach is decompressed in appearance. Normal small bowel rotation is noted. Lymphatic: No lymphadenopathy by CT size criteria. Reproductive: Left adnexal 5.8 x 4 x 5 cm cyst which could be further correlated with ultrasound. Uterus and right adnexa appear unremarkable. Other: No free air nor free fluid. Musculoskeletal: Diffuse sclerotic appearance of the dorsal spine compatible with chronic renal osteodystrophy. No acute nor aggressive osseous lesions. Review of the MIP images confirms the above findings. IMPRESSION: Vascular: 1. Stable remote postop change status post ascending thoracic aortic dissection repair. 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 2. Small ulcerated plaque or tiny focal distal descending thoracic aortic chronic dissection along the left lateral wall, series 8/78 versus series 3/5 of  prior CT abdomen. No significant change. 3. Cardiomegaly with left ventricular hypertrophy is redemonstrated. 4. Nonaneurysmal abdominal aorta without dissection. Patent branch vessels. Nonvascular: 1. Uncomplicated cholelithiasis. 2. Stigmata of chronic renal osteodystrophy. 3. Left adnexal cyst measuring 5.8 x 4 x 5 cm. This could be further correlated with  ultrasound of the pelvis. Electronically Signed   By: Ashley Royalty M.D.   On: 02/18/2018 17:14    Medications:  . amLODipine  10 mg Oral Daily  . aspirin EC  325 mg Oral Daily  . atorvastatin  80 mg Oral q1800  . Chlorhexidine Gluconate Cloth  6 each Topical Q0600  . Chlorhexidine Gluconate Cloth  6 each Topical Q0600  . cloNIDine  0.2 mg Oral TID  . heparin  4,600 Units Dialysis Once in dialysis  . hydrALAZINE  100 mg Oral TID  . isosorbide mononitrate  120 mg Oral Daily  . multivitamin  1 tablet Oral QHS  . mupirocin ointment  1 application Nasal BID    Dialysis Orders: Dialysis: [facility]  4h   350/800   47.5kg  2/2 bath  R IJ TDC   Hep 4600  - hect 4 ug tiw  - no esa    Home meds:  - amlodipine 10 qd/ clonidine 0.2 tid/ hydralazine 100 tid (?) / labetalol 200 bid (?)  - calcitriol 0.25 qd/ fe so4 bid  Assessment/Plan: 1. Hypertensive crisis - long hx non compliance/issues getting meds. BP improved on clonidine, hydralazine, and amlodipine.  Labetolol d/c. Continue to monitor.  2. Chest pain w/Hx chronic type B aortic dissection and type A aortic dissection repair- Pain resolved. CT showed no acute changes. Cardio consulted.  3. ESRD - TTS schedule. Off sched HD today for Sat, resume regular schedule tomorrow. 4. Anemia of CKD- Hgb 10.9>8.3. T sat 12% on 9/26.  Start Fe with HD. Recheck CBC post surgery.  5. Secondary hyperparathyroidism - Last Ca and phos in goal. PTH^, continue VDRA.  No binders. RFP ordered pre HD. 6. Volume -  Does not appear volume overloaded on exam, 2.5L over edw.  Titrate down volume as tolerated.   7. Nutrition - Renal diet with fluid restrictions.  8. Hx TTP 9. Hx substance abuse  Jen Mow, PA-C [facility] Pager: [phone] 02/20/2018,11:18 AM  LOS: 0 days   Pt seen, examined and agree w A/P as above.  Kelly Splinter MD [employer] pager [phone]   02/20/2018, 12:00 PM

## 2018-02-20 NOTE — Op Note (Signed)
OPERATIVE NOTE   PROCEDURE: 1. right first stage basilic vein transposition (brachiobasilic arteriovenous fistula) placement  PRE-OPERATIVE DIAGNOSIS: ESRD  POST-OPERATIVE DIAGNOSIS: same  SURGEON: Marty Heck, MD  ASSISTANT(S): Arlee Muslim, PA  ANESTHESIA: MAC  ESTIMATED BLOOD LOSS: Minimal  FINDING(S): 1.  Basilic vein: 2.5 mm, borderline (small duplicated basilic vein system in distal arm with both veins marginal) 2.  Brachial artery: 3.5 mm, disease free 3.  Venous outflow: faintly palpable thrill  4.  Radial flow: palpable radial pulse  SPECIMEN(S):  none  INDICATIONS:   Valerie Mathis is a 32 y.o. female who presents with ESRD for permanent dialysis access.  The patient is scheduled for right first stage basilic vein transposition.  The patient is aware the risks include but are not limited to: bleeding, infection, steal syndrome, nerve damage, ischemic monomelic neuropathy, failure to mature, and need for additional procedures.  The patient is aware of the risks of the procedure and elects to proceed forward.  DESCRIPTION: After full informed written consent was obtained from the patient, the patient was brought back to the operating room and placed supine upon the operating table.  Prior to induction, the patient received IV antibiotics.   After obtaining adequate anesthesia, the patient was then prepped and draped in the standard fashion for a right arm access procedure.  I turned my attention first to identifying the patient's basilic vein and brachial artery.    Using SonoSite guidance, the location of these vessels were marked out on the skin.   At this point, I injected local anesthetic to obtain a field block of the antecubitum.  In total, I injected about 10 mL of 1% lidocaine without epinephrine.  I made a transverse incision at the level of the antecubitum and dissected through the subcutaneous tissue and fascia to gain exposure of the brachial artery.   This was noted to be 3.5 mm in diameter externally.  This was dissected out proximally and distally and controlled with vessel loops .  I then dissected out the basilic vein.  This was a duplicated system and one basilic vein was about 2.0 mm and the other paired vein was 2.5 mm.  I ligated the smaller branch with a 3-0 silk.  The larger branch was ligated with a  3-0 silk distally and the vein was transected.  The proximal segment was interrogated with serial dilators.  The vein accepted up to a 2.5 mm dilator without any difficulty.  I then instilled the heparinized saline into the vein and clamped it.  At this point, I reset my exposure of the brachial artery and placed the artery under tension proximally and distally.  I made an arteriotomy with a #11 blade, and then I extended the arteriotomy with a Potts scissor.  I injected heparinized saline proximal and distal to this arteriotomy.  The vein was then sewn to the artery in an end-to-side configuration with a running stitch of 6-0 Prolene.  Prior to completing this anastomosis, I allowed the vein and artery to backbleed.  There was no evidence of clot from any vessels.  I completed the anastomosis in the usual fashion and then released all vessel loops and clamps.    There was a faintly palpable thrill in the venous outflow, and there was a palpable radial pulse.  At this point, I irrigated out the surgical wound.  There was no further active bleeding.  The subcutaneous tissue was reapproximated with a running stitch of 3-0 Vicryl.  The skin was then reapproximated with a running subcuticular stitch of 4-0 Monocryl.  The skin was then cleaned, dried, and reinforced with Dermabond.  The patient tolerated this procedure well.   COMPLICATIONS: None  CONDITION: Stable   Marty Heck MD [facility] Office: [phone] Pager: [phone]  02/20/2018, 9:03 AM

## 2018-02-20 NOTE — Telephone Encounter (Signed)
sch appt phone NA mld ltr 04/18/18 3pm Dialysis Duplex 345pm p/o MD

## 2018-02-20 NOTE — Anesthesia Procedure Notes (Signed)
Procedure Name: MAC Date/Time: 02/20/2018 7:30 AM Performed by: Moshe Salisbury, CRNA Pre-anesthesia Checklist: Patient identified, Emergency Drugs available, Suction available, Patient being monitored and Timeout performed Patient Re-evaluated:Patient Re-evaluated prior to induction Oxygen Delivery Method: Nasal cannula Placement Confirmation: positive ETCO2 Dental Injury: Teeth and Oropharynx as per pre-operative assessment

## 2018-02-21 ENCOUNTER — Other Ambulatory Visit: Payer: Self-pay

## 2018-02-21 ENCOUNTER — Encounter (HOSPITAL_COMMUNITY): Payer: Self-pay | Admitting: Vascular Surgery

## 2018-02-21 DIAGNOSIS — I161 Hypertensive emergency: Secondary | ICD-10-CM | POA: Diagnosis not present

## 2018-02-21 DIAGNOSIS — R079 Chest pain, unspecified: Secondary | ICD-10-CM | POA: Diagnosis not present

## 2018-02-21 DIAGNOSIS — I120 Hypertensive chronic kidney disease with stage 5 chronic kidney disease or end stage renal disease: Secondary | ICD-10-CM | POA: Diagnosis not present

## 2018-02-21 DIAGNOSIS — I7101 Dissection of thoracic aorta: Secondary | ICD-10-CM | POA: Diagnosis not present

## 2018-02-21 DIAGNOSIS — Z992 Dependence on renal dialysis: Principal | ICD-10-CM

## 2018-02-21 DIAGNOSIS — N186 End stage renal disease: Secondary | ICD-10-CM

## 2018-02-21 DIAGNOSIS — Z9114 Patient's other noncompliance with medication regimen: Secondary | ICD-10-CM

## 2018-02-21 LAB — RENAL FUNCTION PANEL
ALBUMIN: 3.1 g/dL — AB (ref 3.5–5.0)
Anion gap: 11 (ref 5–15)
BUN: 17 mg/dL (ref 6–20)
CALCIUM: 8.2 mg/dL — AB (ref 8.9–10.3)
CO2: 26 mmol/L (ref 22–32)
Chloride: 98 mmol/L (ref 98–111)
Creatinine, Ser: 3.71 mg/dL — ABNORMAL HIGH (ref 0.44–1.00)
GFR, EST AFRICAN AMERICAN: 18 mL/min — AB (ref >60)
GFR, EST NON AFRICAN AMERICAN: 15 mL/min — AB (ref >60)
Glucose, Bld: 121 mg/dL — ABNORMAL HIGH (ref 70–99)
PHOSPHORUS: 4.8 mg/dL — AB (ref 2.5–4.6)
Potassium: 3.6 mmol/L (ref 3.5–5.1)
SODIUM: 135 mmol/L (ref 135–145)

## 2018-02-21 LAB — CBC
HCT: 27 % — ABNORMAL LOW (ref 36.0–46.0)
Hemoglobin: 8.5 g/dL — ABNORMAL LOW (ref 12.0–15.0)
MCH: 28.1 pg (ref 26.0–34.0)
MCHC: 31.5 g/dL (ref 30.0–36.0)
MCV: 89.1 fL (ref 80.0–100.0)
Platelets: 132 10*3/uL — ABNORMAL LOW (ref 150–400)
RBC: 3.03 MIL/uL — AB (ref 3.87–5.11)
RDW: 16.4 % — ABNORMAL HIGH (ref 11.5–15.5)
WBC: 6 10*3/uL (ref 4.0–10.5)

## 2018-02-21 MED ORDER — HEPARIN SODIUM (PORCINE) 1000 UNIT/ML DIALYSIS
1000.0000 [IU] | INTRAMUSCULAR | Status: DC | PRN
Start: 2018-02-21 — End: 2018-02-21

## 2018-02-21 MED ORDER — LIDOCAINE-PRILOCAINE 2.5-2.5 % EX CREA: 1.0000 "application " | TOPICAL_CREAM | CUTANEOUS | Status: DC | PRN

## 2018-02-21 MED ORDER — CHLORHEXIDINE GLUCONATE CLOTH 2 % EX PADS
6.0000 | MEDICATED_PAD | Freq: Every day | CUTANEOUS | Status: DC
  Administered 2018-02-21: 6 via TOPICAL

## 2018-02-21 MED ORDER — HYDROCODONE-ACETAMINOPHEN 5-325 MG PO TABS
ORAL_TABLET | ORAL | Status: AC
  Filled 2018-02-21: qty 2

## 2018-02-21 MED ORDER — ALTEPLASE 2 MG IJ SOLR: 2.0000 mg | Freq: Once | INTRAMUSCULAR | Status: DC | PRN

## 2018-02-21 MED ORDER — HEPARIN SODIUM (PORCINE) 1000 UNIT/ML IJ SOLN
INTRAMUSCULAR | Status: AC
  Administered 2018-02-21: 3400 [IU]
  Filled 2018-02-21: qty 4

## 2018-02-21 MED ORDER — LIDOCAINE HCL (PF) 1 % IJ SOLN: 5.0000 mL | INTRAMUSCULAR | Status: DC | PRN

## 2018-02-21 MED ORDER — HYDROCODONE-ACETAMINOPHEN 5-325 MG PO TABS: 1.0000 | ORAL_TABLET | Freq: Four times a day (QID) | ORAL | 0 refills | Status: DC | PRN

## 2018-02-21 MED ORDER — SODIUM CHLORIDE 0.9 % IV SOLN: 100.0000 mL | INTRAVENOUS | Status: DC | PRN

## 2018-02-21 MED ORDER — RENA-VITE PO TABS: 1.0000 | ORAL_TABLET | Freq: Every day | ORAL | 1 refills | Status: DC

## 2018-02-21 MED ORDER — ATORVASTATIN CALCIUM 80 MG PO TABS: 80.0000 mg | ORAL_TABLET | Freq: Every day | ORAL | 1 refills | Status: DC

## 2018-02-21 MED ORDER — PENTAFLUOROPROP-TETRAFLUOROETH EX AERO: 1.0000 "application " | INHALATION_SPRAY | CUTANEOUS | Status: DC | PRN

## 2018-02-21 NOTE — Progress Notes (Signed)
[facility] [place]  Subjective  - POD#1 s/p right upper extremity 1st stage brachiobasilic fistula.  No acute events overnight.  Denies weakness or numbness in right hand.   Objective (!) 148/88 73 98.4 F (36.9 C) (Oral) 18 100%  Intake/Output Summary (Last 24 hours) at 02/21/2018 0754 Last data filed at 02/20/2018 2200 Gross per 24 hour  Intake 570 ml  Output 3400 ml  Net -2830 ml    PE General: NAD, pleasant Extremities: Left antecubital incision c/d/i, thrill in fistula palpable  Laboratory Lab Results: Recent Labs    02/20/18 0021 02/21/18 0526  WBC 5.8 6.0  HGB 8.3* 8.5*  HCT 26.9* 27.0*  PLT 148* 132*   BMET Recent Labs    02/18/18 1309 02/18/18 1316 02/20/18 1428  NA 136 137 136  K 3.6 3.7 3.8  CL 103 105 105  CO2 20*  --  21*  GLUCOSE 115* 112* 114*  BUN 29* 32* 39*  CREATININE 4.29* 4.60* 5.52*  CALCIUM 8.4*  --  7.5*    COAG Lab Results  Component Value Date   INR 1.12 01/19/2018   INR 1.19 11/29/2017   No results found for: PTT  Assessment/Planning: POD #1 s/p right 1st stage brachiobasilic fistula.  Doing well, palpable thrill.  OK for discharge from our standpoint.  Will arrange follow-up in our clinic with fistula duplex in 4-6 weeks.  IF ok will proceed with stage 2 after clinic visit.  Marty Heck 02/21/2018 7:54 AM --  Marty Heck, MD [facility] [place] Office: [phone] Pager: [place]

## 2018-02-21 NOTE — Discharge Instructions (Signed)
[facility]  Discharge Instructions  AV Fistula or Graft Surgery for Dialysis Access  Please refer to the following instructions for your post-procedure care. Your surgeon or physician assistant will discuss any changes with you.  Activity  You may drive the day following your surgery, if you are comfortable and no longer taking prescription pain medication. Resume full activity as the soreness in your incision resolves.  Bathing/Showering  You may shower after you go home. Keep your incision dry for 48 hours. Do not soak in a bathtub, hot tub, or swim until the incision heals completely. You may not shower if you have a hemodialysis catheter.  Incision Care  Clean your incision with mild soap and water after 48 hours. Pat the area dry with a clean towel. You do not need a bandage unless otherwise instructed. Do not apply any ointments or creams to your incision. You may have skin glue on your incision. Do not peel it off. It will come off on its own in about one week. Your arm may swell a bit after surgery. To reduce swelling use pillows to elevate your arm so it is above your heart. Your doctor will tell you if you need to lightly wrap your arm with an ACE bandage.  Diet  Resume your normal diet. There are not special food restrictions following this procedure. In order to heal from your surgery, it is CRITICAL to get adequate nutrition. Your body requires vitamins, minerals, and protein. Vegetables are the best source of vitamins and minerals. Vegetables also provide the perfect balance of protein. Processed food has little nutritional value, so try to avoid this.  Medications  Resume taking all of your medications. If your incision is causing pain, you may take over-the counter pain relievers such as acetaminophen (Tylenol). If you were prescribed a stronger pain medication, please be aware these medications can cause nausea and constipation. Prevent  nausea by taking the medication with a snack or meal. Avoid constipation by drinking plenty of fluids and eating foods with high amount of fiber, such as fruits, vegetables, and grains. Do not take Tylenol if you are taking prescription pain medications.     Follow up Your surgeon may want to see you in the office following your access surgery. If so, this will be arranged at the time of your surgery.  Please call us immediately for any of the following conditions:  Increased pain, redness, drainage (pus) from your incision site Fever of 101 degrees or higher Severe or worsening pain at your incision site Hand pain or numbness.  Reduce your risk of vascular disease:  Stop smoking. If you would like help, call [facility] at 1-800-QUIT-NOW 617 623 6054) or [place] at [place] your cholesterol Maintain a desired weight Control your diabetes Keep your blood pressure down  Dialysis  It will take several weeks to several months for your new dialysis access to be ready for use. Your surgeon will determine when it is OK to use it. Your nephrologist will continue to direct your dialysis. You can continue to use your Permcath until your new access is ready for use.  If you have any questions, please call the office at [phone].    It was our pleasure taking care of you at [facility] are glad that you feel better today.  Please make sure to take your medications as instructed and pay attention to the changes on your medication.  Please follow-up with  your primary care or nephrologist in 2 weeks.  thanks, Dr. Linna Hoff

## 2018-02-21 NOTE — Progress Notes (Signed)
   Subjective: Patient was seen and evaluated at bedside this AM and then during morning rounds. No acute events overnight.  She feels well denies any chest pain, shortness of breath, nausea vomiting.   Objective:  Vital signs in last 24 hours: Vitals:   02/20/18 2143 02/21/18 0035 02/21/18 0404 02/21/18 0500  BP: 140/68 (!) 148/86 (!) 161/83   Pulse:  70 70   Resp:  16 18   Temp:  98 F (36.7 C) 98.1 F (36.7 C)   TempSrc:  Oral Oral   SpO2:  100% 100%   Weight:    46.6 kg  Height:       Physical Exam  Constitutional: She is oriented to person, place, and time. She appears well-developed and well-nourished. No distress.  HENT:  Head: Normocephalic and atraumatic.  Eyes: EOM are normal.  Cardiovascular: Normal rate, regular rhythm, normal heart sounds and intact distal pulses.  No murmur heard. Pulmonary/Chest: Effort normal and breath sounds normal. No respiratory distress. She has no wheezes. She has no rales.  Abdominal: Soft. Bowel sounds are normal. There is no tenderness.  Extremitis: She exhibits no edema or tenderness. Incision site for brachiobrachial arteriovenous fistula is clean. No drainage or redness. Thrill present Neurological: She is alert and oriented to person, place, and time.    Assessment/Plan:  Principal Problem:   Hypertensive emergency Active Problems:   Dissection of aorta, thoracic (HCC)   Chest pain   Normocytic anemia   ESRD on dialysis [facility])  32 year old female with history of ascending aortic dissection (chronic type B aortic dissection), status post repair, ESRD on hemodialysis 3 times per week Tuesday Thursday Saturday, polysubstance abuse, presented with chest pain.  Chest pain: Associated with hypertensive emergencies. Also Hx of poly substance abuse. Resolved with BP controled. Cardiology work-up ruled out acute coronary event. She has remained asymptomatic. after controlling blood pressure. -DC today after HD  Hypertensive  emergencies: Controled. BP today 144/88 -Dc after HD with  clonidin, hydralazin, amlodipine.  -Stop Labetalol at home.    Thoracic aorta dissection: Stable   ESRD:  S/p "rightfirst stage basilic vein transposition (brachiobasilic arteriovenous fistula) placement" procedure. Incision site is intact. Thrill is present. Nephro is on board. Patient is euvolemic. -DC after HD today. -Continue regular outpatient HD schedule after DC   Polysubstance abuse: UDS positive for cocaine.   Dispo: Discharge today  Dewayne Hatch, MD 02/21/2018, 5:57 AM Pager: [phone]

## 2018-02-21 NOTE — Discharge Summary (Addendum)
Name: Valerie Mathis MRN: [id number] DOB: 1986/01/07 32 y.o. PCP: Patient, No Pcp Per  Date of Admission: 02/18/2018 12:50 PM Date of Discharge: 02/21/2018 Attending Physician: Lucious Groves, DO  Discharge Diagnosis: 1.  Hypertensive crisis   Discharge Medications: Allergies as of 02/21/2018   No Known Allergies     Medication List    STOP taking these medications   labetalol 200 MG tablet Commonly known as:  NORMODYNE     TAKE these medications   amLODipine 10 MG tablet Commonly known as:  NORVASC Take 1 tablet (10 mg total) by mouth daily. for high blood pressure   atorvastatin 80 MG tablet Commonly known as:  LIPITOR Take 1 tablet (80 mg total) by mouth daily at 6 PM.   calcitRIOL 0.25 MCG capsule Commonly known as:  ROCALTROL Take 1 capsule (0.25 mcg total) by mouth daily.   cloNIDine 0.2 MG tablet Commonly known as:  CATAPRES Take 1 tablet (0.2 mg total) by mouth 3 (three) times daily.   ferrous sulfate 325 (65 FE) MG tablet Take 1 tablet (325 mg total) by mouth 2 (two) times daily with a meal.   hydrALAZINE 100 MG tablet Commonly known as:  APRESOLINE Take 1 tablet (100 mg total) by mouth 3 (three) times daily.   HYDROcodone-acetaminophen 5-325 MG tablet Commonly known as:  NORCO/VICODIN Take 1 tablet by mouth every 6 (six) hours as needed for moderate pain.   HYDROcodone-acetaminophen 5-325 MG tablet Commonly known as:  NORCO/VICODIN Take 1-2 tablets by mouth every 6 (six) hours as needed for moderate pain.   isosorbide mononitrate 120 MG 24 hr tablet Commonly known as:  IMDUR Take 1 tablet (120 mg total) by mouth daily.   multivitamin Tabs tablet Take 1 tablet by mouth at bedtime.   sodium bicarbonate 650 MG tablet Take 1 tablet (650 mg total) by mouth 2 (two) times daily.       Disposition and follow-up:   Ms.Valerie Mathis was [facility] from [facility] in Stable condition.  At the hospital follow up visit please  address:  1.  Patient has history of chronic type B aortic dissection and type a aortic dissection repair as well as hypertension.  Please make sure she is compliant with her medications.  2. ESRD on hemodialysis TTS.  Please make sure patient has renal diet with fluid restriction.  Also : new R 1 stage brachiobasilic fistula placed on 10/7  by Dr. Carlis Abbott.  Doing well at discharge, palpable thrill. f/u in 4-6 weeks.   3. Labetalol discontinued due to polysubstance abuse. Please make sure she avoid beta blockers with cocaine use.  4.  Labs / imaging needed at time of follow-up: BMP, CBC  5.  Pending labs/ test needing follow-up: none  Follow-up Appointments: Follow-up Information    Marty Heck, MD Follow up in 6 week(s).   Specialty:  Vascular Surgery Why:  office will call to schedule Contact information: [address] [place] [address] [place] by problem list: 32 year old female with history of ascending aortic dissection (chronic type B aortic dissection), status post repair, ESRD on hemodialysis 3 times per week Tuesday Thursday Saturday, polysubstance abuse, presented with chest pain during HD session.  1.  Chest pain: Patient with history of chronic type B aortic dissection and type a aortic dissection repair, hypertension, polysubstance abuse, long history of noncompliance to medications.  Presented with chest pain and severe high  blood pressure .EKG showed subtle ST elevation in V1-V3 which was unchanged from previous EKG. Had Minimally elevated troponin. Cardiology was on board. no evidence of acute coronary syndrome.   Chest pain resolved as soon as blood pressure controlled with NGx3. CT scan showed stable aortic dissection with no acute changes.  Patient remained symptom-free.  Chest pain was likely secondary to severe hypertension and demand ischemia, substance abuse and medication noncompliance. And mildly elevated Trop likely  secondary to underlying end-stage renal diseaseas well as demand ischemia.  2.  ESRD, on hemodialysis TTS.   new R 1 stage brachiobasilic fistula placed on 10/7. Got 2 HD sessions during hospitalization. (Last one at 02/21/2018) Euvolemic and stable at discharge.   Discharge Vitals:   BP (!) 193/111   Pulse 81   Temp (!) 97.5 F (36.4 C) (Oral)   Resp 18   Ht 5\' 3"  (1.6 m)   Wt 48.5 kg   LMP 02/01/2018 (Approximate)   SpO2 100%   BMI 18.94 kg/m   Pertinent Labs, Studies, and Procedures:  Lab 02/18/18 2344 02/19/18 0645 02/19/18 1138  TROPONINI 0.27* 0.27* 0.23*   CT ANGIOGRAPHY   1. Stable remote postop change status post ascending thoracic aortic dissection repair.  Echo: Compared to the prior study, there has been no significant   interval change.  Discharge Instructions:   Signed: Dewayne Hatch, MD 02/21/2018, 5:11 PM   Pager: [phone]

## 2018-02-21 NOTE — Progress Notes (Signed)
Pt. bp 183/111. Pt bp meds held this am fo HD Jen Mow PA made aware orders to give held dose clonidine, floor nurse to administer remaining held bp meds.

## 2018-02-21 NOTE — Progress Notes (Addendum)
[name] Progress Note   Subjective:   Feeling well. Laying in bed, no new complaints.    Objective Vitals:   02/21/18 0035 02/21/18 0404 02/21/18 0500 02/21/18 0749  BP: (!) 148/86 (!) 161/83  (!) 148/88  Pulse: 70 70  73  Resp: 16 18    Temp: 98 F (36.7 C) 98.1 F (36.7 C)  98.4 F (36.9 C)  TempSrc: Oral Oral  Oral  SpO2: 100% 100%  100%  Weight:   46.6 kg   Height:       Physical Exam General:NAD, WDWN pleasant female Heart:RRR, +5/8 systolic murmur Lungs:CTAB, no wheeze, rales or rhonchi Abdomen:soft, NTND Extremities:no LE edema Dialysis Access: R IJ TDC, new RU AVF +b/t   Filed Weights   02/20/18 1310 02/20/18 1654 02/21/18 0500  Weight: 50 kg 47.7 kg 46.6 kg    Intake/Output Summary (Last 24 hours) at 02/21/2018 1142 Last data filed at 02/20/2018 2200 Gross per 24 hour  Intake 120 ml  Output 3400 ml  Net -3280 ml    Additional Objective Labs: Basic Metabolic Panel: Recent Labs  Lab 02/17/18 2253 02/18/18 1309 02/18/18 1316 02/18/18 2344 02/20/18 1428  NA 137 136 137  --  136  K 4.2 3.6 3.7  --  3.8  CL 102 103 105  --  105  CO2 20* 20*  --   --  21*  GLUCOSE 105* 115* 112*  --  114*  BUN 30* 29* 32*  --  39*  CREATININE 4.23* 4.29* 4.60*  --  5.52*  CALCIUM 8.5* 8.4*  --   --  7.5*  PHOS  --   --   --  4.8* 5.6*   Liver Function Tests: Recent Labs  Lab 02/18/18 1309 02/20/18 1428  AST 17  --   ALT 9  --   ALKPHOS 169*  --   BILITOT 0.6  --   PROT 6.8  --   ALBUMIN 3.6 2.9*   CBC: Recent Labs  Lab 02/17/18 2253 02/18/18 1309 02/18/18 1316 02/20/18 0021 02/21/18 0526  WBC 8.5 7.8  --  5.8 6.0  NEUTROABS  --  5.5  --   --   --   HGB 10.5* 9.7* 10.9* 8.3* 8.5*  HCT 33.6* 30.5* 32.0* 26.9* 27.0*  MCV 87.7 88.9  --  89.7 89.1  PLT 186 148*  --  148* 132*   Blood Culture    Component Value Date/Time   SDES  12/17/2017 1834    URINE, RANDOM Performed at [facility], [place] [address], [place], [place] [address]    SPECREQUEST  12/17/2017 1834    NONE Performed at [facility], [place] [address], [place], [place] [address]    CULT  12/17/2017 1834    NO GROWTH Performed at [place] [address] [place], [place] [address]    REPTSTATUS 12/19/2017 FINAL 12/17/2017 1834    Cardiac Enzymes: Recent Labs  Lab 02/18/18 2344 02/19/18 0645 02/19/18 1138  TROPONINI 0.27* 0.27* 0.23*   Iron Studies:  Recent Labs    02/18/18 2344  FERRITIN 81   Lab Results  Component Value Date   INR 1.12 01/19/2018   INR 1.19 11/29/2017   Studies/Results: No results found.  Medications: . ferric gluconate (FERRLECIT/NULECIT) IV     . amLODipine  10 mg Oral Daily  . aspirin EC  325 mg Oral Daily  . atorvastatin  80 mg Oral q1800  . Chlorhexidine  Gluconate Cloth  6 each Topical [id number]  . Chlorhexidine Gluconate Cloth  6 each Topical Q0600  . cloNIDine  0.2 mg Oral TID  . heparin  4,600 Units Dialysis Once in dialysis  . hydrALAZINE  100 mg Oral TID  . isosorbide mononitrate  120 mg Oral Daily  . multivitamin  1 tablet Oral QHS  . mupirocin ointment  1 application Nasal BID    Dialysis Orders: Dialysis:[facility] 4h 350/800 47.5kg 2/2 bath R IJ TDC Hep 4600 - hect 4 ug tiw - no esa   Home meds: - amlodipine 10 qd/ clonidine 0.2 tid/ hydralazine 100 tid (?) / labetalol 200 bid (?) - calcitriol 0.25 qd/ fe so4 bid  Assessment/Plan: 1. Hypertensive crisis - long hx non compliance/issues getting meds. BP improved on clonidine, hydralazine, and amlodipine.  Labetolol d/c. Continue to monitor.  2. Chest pain w/Hx chronic type B aortic dissection and type A aortic dissection repair- Pain resolved. CT showed no acute changes. Cardio consulted.  3. ESRD - TTS - orders written for HD today per regular schedule. New R 1 stage brachiobasilic fistula placed on 10/7 by Dr. Carlis Abbott - f/u in 4-6 weeks.  4. Anemia of CKD- Hgb  10.9>8.3>8.5. T sat 12% on 9/26.  Start Fe with HD.  5. Secondary hyperparathyroidism - CCa 8.4, Phos 5.6. PTH^, continue VDRA.  No binders currently,will need to start if phos remains elevated.  6. Volume -  Does not appear volume overloaded on exam, 1L over edw.  net UF 2.5L removed with HD yesterday. Titrate down volume as tolerated.   7. Nutrition - Renal diet with fluid restrictions.  8. Hx TTP 9. Hx substance abuse  Jen Mow, PA-C [facility] Pager: [phone] 02/21/2018,11:42 AM  LOS: 0 days   Pt seen, examined and agree w A/P as above.  Kelly Splinter MD [employer] pager [phone]   02/21/2018, 12:13 PM

## 2018-03-01 ENCOUNTER — Inpatient Hospital Stay: Admission: RE | Admit: 2018-03-01 | Payer: Medicaid Other | Source: Ambulatory Visit

## 2018-03-16 ENCOUNTER — Telehealth: Payer: Self-pay | Admitting: *Deleted

## 2018-03-16 NOTE — Telephone Encounter (Signed)
called pt. 2x no answer busy singal (trying to notify pt. That appt. Is no longer needed due to not having MRA done before provider visit. (per Delsa Sale.)

## 2018-03-21 ENCOUNTER — Ambulatory Visit: Payer: Self-pay | Admitting: Vascular Surgery

## 2018-04-11 ENCOUNTER — Encounter (HOSPITAL_COMMUNITY): Payer: Self-pay

## 2018-04-11 ENCOUNTER — Emergency Department (HOSPITAL_COMMUNITY)
Admission: EM | Admit: 2018-04-11 | Discharge: 2018-04-11 | Disposition: A | Discharge status: Home / Self Care | Payer: Medicaid Other | Attending: Emergency Medicine | Admitting: Emergency Medicine

## 2018-04-11 DIAGNOSIS — Z992 Dependence on renal dialysis: Secondary | ICD-10-CM | POA: Diagnosis not present

## 2018-04-11 DIAGNOSIS — I509 Heart failure, unspecified: Secondary | ICD-10-CM | POA: Diagnosis not present

## 2018-04-11 DIAGNOSIS — Z79899 Other long term (current) drug therapy: Secondary | ICD-10-CM | POA: Diagnosis not present

## 2018-04-11 DIAGNOSIS — Z87891 Personal history of nicotine dependence: Secondary | ICD-10-CM | POA: Diagnosis not present

## 2018-04-11 DIAGNOSIS — I132 Hypertensive heart and chronic kidney disease with heart failure and with stage 5 chronic kidney disease, or end stage renal disease: Secondary | ICD-10-CM | POA: Diagnosis not present

## 2018-04-11 DIAGNOSIS — N186 End stage renal disease: Secondary | ICD-10-CM | POA: Diagnosis not present

## 2018-04-11 DIAGNOSIS — Z0489 Encounter for examination and observation for other specified reasons: Secondary | ICD-10-CM | POA: Diagnosis present

## 2018-04-11 LAB — CBC WITH DIFFERENTIAL/PLATELET
Abs Immature Granulocytes: 0.02 10*3/uL (ref 0.00–0.07)
Basophils Absolute: 0.1 10*3/uL (ref 0.0–0.1)
Basophils Relative: 2 %
Eosinophils Absolute: 0.4 10*3/uL (ref 0.0–0.5)
Eosinophils Relative: 9 %
HCT: 33.4 % — ABNORMAL LOW (ref 36.0–46.0)
Hemoglobin: 10 g/dL — ABNORMAL LOW (ref 12.0–15.0)
Immature Granulocytes: 1 %
Lymphocytes Relative: 17 %
Lymphs Abs: 0.7 10*3/uL (ref 0.7–4.0)
MCH: 27.6 pg (ref 26.0–34.0)
MCHC: 29.9 g/dL — ABNORMAL LOW (ref 30.0–36.0)
MCV: 92.3 fL (ref 80.0–100.0)
Monocytes Absolute: 0.4 10*3/uL (ref 0.1–1.0)
Monocytes Relative: 11 %
Neutro Abs: 2.5 10*3/uL (ref 1.7–7.7)
Neutrophils Relative %: 60 %
Platelets: 163 10*3/uL (ref 150–400)
RBC: 3.62 MIL/uL — ABNORMAL LOW (ref 3.87–5.11)
RDW: 18.2 % — ABNORMAL HIGH (ref 11.5–15.5)
WBC: 4.1 10*3/uL (ref 4.0–10.5)
nRBC: 0 % (ref 0.0–0.2)

## 2018-04-11 LAB — COMPREHENSIVE METABOLIC PANEL
ALT: 10 U/L (ref 0–44)
AST: 12 U/L — ABNORMAL LOW (ref 15–41)
Albumin: 3.6 g/dL (ref 3.5–5.0)
Alkaline Phosphatase: 174 U/L — ABNORMAL HIGH (ref 38–126)
Anion gap: 10 (ref 5–15)
BUN: 43 mg/dL — ABNORMAL HIGH (ref 6–20)
CO2: 16 mmol/L — ABNORMAL LOW (ref 22–32)
Calcium: 8.4 mg/dL — ABNORMAL LOW (ref 8.9–10.3)
Chloride: 111 mmol/L (ref 98–111)
Creatinine, Ser: 6.72 mg/dL — ABNORMAL HIGH (ref 0.44–1.00)
GFR calc Af Amer: 9 mL/min — ABNORMAL LOW (ref >60)
GFR calc non Af Amer: 8 mL/min — ABNORMAL LOW (ref >60)
Glucose, Bld: 103 mg/dL — ABNORMAL HIGH (ref 70–99)
Potassium: 4 mmol/L (ref 3.5–5.1)
Sodium: 137 mmol/L (ref 135–145)
Total Bilirubin: 0.4 mg/dL (ref 0.3–1.2)
Total Protein: 6.6 g/dL (ref 6.5–8.1)

## 2018-04-11 NOTE — ED Provider Notes (Signed)
[facility] EMERGENCY DEPARTMENT Provider Note   CSN: [id number] Arrival date & time: 04/11/18  1457     History   Chief Complaint Chief Complaint  Patient presents with  . Vascular Access Problem    HPI Valerie Mathis is a 32 y.o. female.  HPI   32 year old female with end-stage renal disease.  She states that she was told by her dialysis center that she needed to come for evaluation after she missed her last few dialysis sessions.  She reports transportation issues moving residences recently which made it difficult for her to get there.  She denies any acute complaints whatsoever though.  She says she feels great.  No unusual swelling.  No respiratory complaints.  She states that she has already arranged for [facility] transportation to go to dialysis tomorrow.  Past Medical History:  Diagnosis Date  . Aortic dissection ([place])   . CHF (congestive heart failure) ([place])   . ESRD on hemodialysis ([place])   . H/O: alcohol abuse   . Hypertension   . Pneumonia   . Renal disorder   . 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 Normocytic anemia 02/19/2018  . ESRD on dialysis ([place]) 02/19/2018  . Chest pain 02/18/2018  . Cocaine abuse ([place])   . ARF (acute renal failure) ([place]) 11/29/2017  . History of LUE DVT (deep vein thrombosis) 10/21/2017  . Abdominal pain 10/20/2017  . Uncontrolled hypertension 10/20/2017  . Hypertensive cardiomyopathy ([place]) 10/20/2017  . Dissection of aorta, thoracic ([place])   . Hypertensive heart disease without heart failure   . Cocaine use   . Hypertensive emergency 09/12/2017  . Back pain 09/12/2017  . Tachycardia 09/12/2017  . Chronic kidney disease Stage IV-V   . 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 A/V FISTULAGRAM Left 01/17/2018   Procedure: A/V Fistulagram;  Surgeon: Serafina Mitchell, MD;  Location: [place] [facility];   Service: Cardiovascular;  Laterality: Left;  . AV FISTULA PLACEMENT Left 12/23/2017   Procedure: ARTERIOVENOUS (AV) FISTULA CREATION  LEFT ARM;  Surgeon: Rosetta Posner, MD;  Location: [place];  Service: Vascular;  Laterality: Left;  . [place] Right 02/20/2018   Procedure: Brachiocephalic Fistula creation right upper arm;  Surgeon: Marty Heck, MD;  Location: [place];  Service: Vascular;  Laterality: Right;  . ECTOPIC PREGNANCY SURGERY  2018  . INSERTION OF DIALYSIS CATHETER Right 01/17/2018   Procedure: Insertion Of Dialysis Catheter;  Surgeon: Serafina Mitchell, MD;  Location: [facility];  Service: Cardiovascular;  Laterality: Right;  . LIGATION OF ARTERIOVENOUS  FISTULA Left 01/19/2018   Procedure: LIGATION OF ARTERIOVENOUS  FISTULA left  ARM;  Surgeon: Serafina Mitchell, MD;  Location: [place];  Service: Vascular;  Laterality: Left;  . REPAIR OF ACUTE ASCENDING THORACIC AORTIC DISSECTION       OB History   None      Home Medications    Prior to Admission medications   Medication Sig Start Date End Date Taking? Authorizing Provider  amLODipine (NORVASC) 10 MG tablet Take 1 tablet (10 mg total) by mouth daily. for high blood pressure 01/23/18   Caren Griffins, MD  atorvastatin (LIPITOR) 80 MG tablet Take 1 tablet (80 mg total) by mouth daily at 6 PM. 02/21/18   Lorella Nimrod, MD  calcitRIOL (ROCALTROL) 0.25 MCG capsule Take 1  capsule (0.25 mcg total) by mouth daily. Patient not taking: Reported on 02/14/2018 01/23/18   Caren Griffins, MD  cloNIDine (CATAPRES) 0.2 MG tablet Take 1 tablet (0.2 mg total) by mouth 3 (three) times daily. 01/23/18   Caren Griffins, MD  ferrous sulfate 325 (65 FE) MG tablet Take 1 tablet (325 mg total) by mouth 2 (two) times daily with a meal. Patient not taking: Reported on 02/18/2018 01/23/18   Caren Griffins, MD  hydrALAZINE (APRESOLINE) 100 MG tablet Take 1 tablet (100 mg total) by mouth 3 (three) times daily. 01/23/18   Caren Griffins, MD  HYDROcodone-acetaminophen (NORCO) 5-325 MG tablet Take 1 tablet by mouth every 6 (six) hours as needed for moderate pain. 02/20/18   Dagoberto Ligas, PA-C  HYDROcodone-acetaminophen (NORCO/VICODIN) 5-325 MG tablet Take 1-2 tablets by mouth every 6 (six) hours as needed for moderate pain. 02/21/18   Lorella Nimrod, MD  isosorbide mononitrate (IMDUR) 120 MG 24 hr tablet Take 1 tablet (120 mg total) by mouth daily. 01/23/18   Caren Griffins, MD  multivitamin (RENA-VIT) TABS tablet Take 1 tablet by mouth at bedtime. 02/21/18   Lorella Nimrod, MD  sodium bicarbonate 650 MG tablet Take 1 tablet (650 mg total) by mouth 2 (two) times daily. 01/23/18   Caren Griffins, MD    Family History Family History  Problem Relation Age of Onset  . Hypertension Mother     Social History Social History   Tobacco Use  . Smoking status: Former Smoker    Packs/day: 0.50    Years: 4.00    Pack years: 2.00    Types: Cigarettes  . Smokeless tobacco: Never Used  . Tobacco comment: 12/15/2017  Substance Use Topics  . Alcohol use: Not Currently  . Drug use: Not Currently    Types: Cocaine, Benzodiazepines    Comment: Last use 08/2017     Allergies   Patient has no known allergies.   Review of Systems Review of Systems  All systems reviewed and negative, other than as noted in HPI.  Physical Exam Updated Vital Signs BP (!) 212/112 (BP Location: Right Arm)   Pulse 69   Temp 98.6 F (37 C) (Oral)   Resp 14   SpO2 98%   Physical Exam  Constitutional: She appears well-developed and well-nourished. No distress.  HENT:  Head: Normocephalic and atraumatic.  Eyes: Conjunctivae are normal. Right eye exhibits no discharge. Left eye exhibits no discharge.  Neck: Neck supple.  Cardiovascular: Normal rate, regular rhythm and normal heart sounds. Exam reveals no gallop and no friction rub.  No murmur heard. Systolic murmur.  AV fistula left right upper extremity with palpable thrill.    Pulmonary/Chest: Effort normal and breath sounds normal. No respiratory distress.  Abdominal: Soft. She exhibits no distension. There is no tenderness.  Musculoskeletal: She exhibits no edema or tenderness.  Neurological: She is alert.  Skin: Skin is warm and dry.  Psychiatric: She has a normal mood and affect. Her behavior is normal. Thought content normal.  Nursing note and vitals reviewed.    ED Treatments / Results  Labs (all labs ordered are listed, but only abnormal results are displayed) Labs Reviewed  COMPREHENSIVE METABOLIC PANEL - Abnormal; Notable for the following components:      Result Value   CO2 16 (*)    Glucose, Bld 103 (*)    BUN 43 (*)    Creatinine, Ser 6.72 (*)    Calcium 8.4 (*)  AST 12 (*)    Alkaline Phosphatase 174 (*)    GFR calc non Af Amer 8 (*)    GFR calc Af Amer 9 (*)    All other components within normal limits  CBC WITH DIFFERENTIAL/PLATELET - Abnormal; Notable for the following components:   RBC 3.62 (*)    Hemoglobin 10.0 (*)    HCT 33.4 (*)    MCHC 29.9 (*)    RDW 18.2 (*)    All other components within normal limits    EKG None  Radiology No results found.  Procedures Procedures (including critical care time)  Medications Ordered in ED Medications - No data to display   Initial Impression / Assessment and Plan / ED Course  I have reviewed the triage vital signs and the nursing notes.  Pertinent labs & imaging results that were available during my care of the patient were reviewed by me and considered in my medical decision making (see chart for details).     32 year old female with ESRD.  She has no acute complaints.  No respiratory distress.  Sounds clear on exam.  Labs are okay with respect to known end-stage renal disease.  No indication for emergent HD.  Final Clinical Impressions(s) / ED Diagnoses   Final diagnoses:  ESRD (end stage renal disease) [facility])    ED Discharge Orders    None       Virgel Manifold, MD 04/19/18 2147

## 2018-04-11 NOTE — ED Notes (Signed)
Patient verbalizes understanding of discharge instructions. Opportunity for questioning and answers were provided. Armband removed by staff, pt discharged from ED.  

## 2018-04-11 NOTE — Discharge Instructions (Addendum)
Make sure you get to dialysis tomorrow. Return to ED if you develop any symptoms such as difficulty breathing or anything else you feel needs to be emergently evaluated.

## 2018-04-11 NOTE — ED Triage Notes (Signed)
Pt presents for evaluation. States she missed dialysis x 3 treatments and was told by dialysis center she had to come for assessment. Pt denies any signs or symptoms, reports she feels "good."

## 2018-04-18 ENCOUNTER — Inpatient Hospital Stay (HOSPITAL_COMMUNITY): Admit: 2018-04-18 | Payer: Medicaid Other

## 2018-04-18 ENCOUNTER — Emergency Department (HOSPITAL_COMMUNITY)
Admission: EM | Admit: 2018-04-18 | Discharge: 2018-04-18 | Disposition: A | Discharge status: Home / Self Care | Payer: Medicaid Other | Attending: Emergency Medicine | Admitting: Emergency Medicine

## 2018-04-18 ENCOUNTER — Encounter: Payer: Medicaid Other | Admitting: Vascular Surgery

## 2018-04-18 ENCOUNTER — Encounter (HOSPITAL_COMMUNITY): Payer: Self-pay

## 2018-04-18 ENCOUNTER — Emergency Department (HOSPITAL_COMMUNITY): Payer: Medicaid Other

## 2018-04-18 DIAGNOSIS — I509 Heart failure, unspecified: Secondary | ICD-10-CM | POA: Diagnosis not present

## 2018-04-18 DIAGNOSIS — N186 End stage renal disease: Secondary | ICD-10-CM | POA: Diagnosis not present

## 2018-04-18 DIAGNOSIS — Z87891 Personal history of nicotine dependence: Secondary | ICD-10-CM | POA: Diagnosis not present

## 2018-04-18 DIAGNOSIS — I132 Hypertensive heart and chronic kidney disease with heart failure and with stage 5 chronic kidney disease, or end stage renal disease: Secondary | ICD-10-CM | POA: Diagnosis present

## 2018-04-18 DIAGNOSIS — Z79899 Other long term (current) drug therapy: Secondary | ICD-10-CM | POA: Diagnosis not present

## 2018-04-18 DIAGNOSIS — Z992 Dependence on renal dialysis: Secondary | ICD-10-CM

## 2018-04-18 DIAGNOSIS — I160 Hypertensive urgency: Secondary | ICD-10-CM

## 2018-04-18 LAB — CBC WITH DIFFERENTIAL/PLATELET
Abs Immature Granulocytes: 0.03 10*3/uL (ref 0.00–0.07)
BASOS PCT: 1 %
Basophils Absolute: 0.1 10*3/uL (ref 0.0–0.1)
Eosinophils Absolute: 0.3 10*3/uL (ref 0.0–0.5)
Eosinophils Relative: 6 %
HCT: 32.4 % — ABNORMAL LOW (ref 36.0–46.0)
Hemoglobin: 10.3 g/dL — ABNORMAL LOW (ref 12.0–15.0)
IMMATURE GRANULOCYTES: 1 %
LYMPHS PCT: 12 %
Lymphs Abs: 0.7 10*3/uL (ref 0.7–4.0)
MCH: 27.6 pg (ref 26.0–34.0)
MCHC: 31.8 g/dL (ref 30.0–36.0)
MCV: 86.9 fL (ref 80.0–100.0)
MONOS PCT: 12 %
Monocytes Absolute: 0.7 10*3/uL (ref 0.1–1.0)
NEUTROS ABS: 3.7 10*3/uL (ref 1.7–7.7)
NEUTROS PCT: 68 %
Platelets: 123 10*3/uL — ABNORMAL LOW (ref 150–400)
RBC: 3.73 MIL/uL — AB (ref 3.87–5.11)
RDW: 17.4 % — ABNORMAL HIGH (ref 11.5–15.5)
WBC: 5.5 10*3/uL (ref 4.0–10.5)
nRBC: 0 % (ref 0.0–0.2)

## 2018-04-18 LAB — COMPREHENSIVE METABOLIC PANEL
ALBUMIN: 3.7 g/dL (ref 3.5–5.0)
ALT: 10 U/L (ref 0–44)
ANION GAP: 10 (ref 5–15)
AST: 16 U/L (ref 15–41)
Alkaline Phosphatase: 168 U/L — ABNORMAL HIGH (ref 38–126)
BILIRUBIN TOTAL: 0.6 mg/dL (ref 0.3–1.2)
BUN: 14 mg/dL (ref 6–20)
CHLORIDE: 101 mmol/L (ref 98–111)
CO2: 26 mmol/L (ref 22–32)
Calcium: 7.7 mg/dL — ABNORMAL LOW (ref 8.9–10.3)
Creatinine, Ser: 3.98 mg/dL — ABNORMAL HIGH (ref 0.44–1.00)
GFR calc Af Amer: 16 mL/min — ABNORMAL LOW (ref >60)
GFR calc non Af Amer: 14 mL/min — ABNORMAL LOW (ref >60)
Glucose, Bld: 103 mg/dL — ABNORMAL HIGH (ref 70–99)
POTASSIUM: 3 mmol/L — AB (ref 3.5–5.1)
SODIUM: 137 mmol/L (ref 135–145)
TOTAL PROTEIN: 6.6 g/dL (ref 6.5–8.1)

## 2018-04-18 LAB — I-STAT TROPONIN, ED: TROPONIN I, POC: 0.04 ng/mL (ref 0.00–0.08)

## 2018-04-18 MED ORDER — LABETALOL HCL 5 MG/ML IV SOLN
20.0000 mg | Freq: Once | INTRAVENOUS | Status: AC
  Administered 2018-04-18: 20 mg via INTRAVENOUS
  Filled 2018-04-18: qty 4

## 2018-04-18 MED ORDER — HYDRALAZINE HCL 20 MG/ML IJ SOLN
20.0000 mg | Freq: Once | INTRAMUSCULAR | Status: AC
  Administered 2018-04-18: 20 mg via INTRAVENOUS
  Filled 2018-04-18: qty 1

## 2018-04-18 MED ORDER — CLONIDINE HCL 0.2 MG PO TABS
0.2000 mg | ORAL_TABLET | Freq: Once | ORAL | Status: AC
  Administered 2018-04-18: 0.2 mg via ORAL
  Filled 2018-04-18: qty 1

## 2018-04-18 MED ORDER — AMLODIPINE BESYLATE 5 MG PO TABS
10.0000 mg | ORAL_TABLET | Freq: Once | ORAL | Status: AC
  Administered 2018-04-18: 10 mg via ORAL
  Filled 2018-04-18: qty 2

## 2018-04-18 MED ORDER — HYDRALAZINE HCL 25 MG PO TABS
50.0000 mg | ORAL_TABLET | Freq: Once | ORAL | Status: AC
  Administered 2018-04-18: 50 mg via ORAL
  Filled 2018-04-18: qty 2

## 2018-04-18 MED ORDER — ISOSORBIDE MONONITRATE ER 30 MG PO TB24
120.0000 mg | ORAL_TABLET | Freq: Every day | ORAL | Status: DC
  Administered 2018-04-18: 120 mg via ORAL
  Filled 2018-04-18: qty 4

## 2018-04-18 NOTE — ED Triage Notes (Signed)
Pt from dialysis via ems;  has not gone to dialysis in 2 weeks prior to today; has not had her clonidine and hydralazine in 2 days; >200/100 during dialysis this am, sent to ED for evaluation; asymptomatic at this time; finished dialysis today; took off 4/10.2 kilos  224/138 HR 88 99% RA RR 18

## 2018-04-18 NOTE — Discharge Instructions (Signed)
1.  Take your evening dose of hydralazine and clonidine before you go to bed tonight.  Resume all of your normal blood pressure medications tomorrow morning.  Take your blood pressure 3 times a day and write down your readings.  Make an appointment to see your nephrologist for recheck this week. 2.  Return to the emergency department if you develop headache, blurred vision, chest pain, shortness of breath or any other concerning symptoms.

## 2018-04-18 NOTE — ED Provider Notes (Signed)
[facility] EMERGENCY DEPARTMENT Provider Note   CSN: [id number] Arrival date & time: 04/18/18  1745     History   Chief Complaint Chief Complaint  Patient presents with  . Hypertension    HPI Valerie Mathis is a 32 y.o. female.  HPI Patient reports he is sent from her dialysis to the emergency department because of her high blood pressure.  She reports that she went to her dialysis session and completed it today.  She reports she had however missed her dialysis for almost 2 weeks beforehand.  She states that was because of family issues and transportation.  She reports that she has been compliant with her medications.  She states that she is not having any headache, no blurred vision, no chest pain, no shortness of breath.  She reports she has had a cold for the past few days.  She has had nasal congestion drainage and very mild cough.  She denies however that she has had a fever or generalized body aches.  She also denies that she is felt short of breath.  She denies she had any pain or swelling in her legs.  She reports she came because of dialysis facility and told her she needed to for her blood pressure.  Patient reports that she has no occasional smoker and denies other drug use.  She reports that she used to use cocaine but that is been several years ago. Past Medical History:  Diagnosis Date  . Aortic dissection ([place])   . CHF (congestive heart failure) ([place])   . ESRD on hemodialysis ([place])   . H/O: alcohol abuse   . Hypertension   . Pneumonia   . Renal disorder   . 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 Normocytic anemia 02/19/2018  . ESRD on dialysis ([place]) 02/19/2018  . Chest pain 02/18/2018  . Cocaine abuse ([place])   . ARF (acute renal failure) ([place]) 11/29/2017  . History of LUE DVT (deep vein thrombosis) 10/21/2017  . Abdominal pain 10/20/2017  . Uncontrolled hypertension 10/20/2017    . Hypertensive cardiomyopathy ([place]) 10/20/2017  . Dissection of aorta, thoracic ([place])   . Hypertensive heart disease without heart failure   . Cocaine use   . Hypertensive emergency 09/12/2017  . Back pain 09/12/2017  . Tachycardia 09/12/2017  . Chronic kidney disease Stage IV-V   . 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 A/V FISTULAGRAM Left 01/17/2018   Procedure: A/V Fistulagram;  Surgeon: Serafina Mitchell, MD;  Location: [place];  Service: Cardiovascular;  Laterality: Left;  . AV FISTULA PLACEMENT Left 12/23/2017   Procedure: ARTERIOVENOUS (AV) FISTULA CREATION  LEFT ARM;  Surgeon: Rosetta Posner, MD;  Location: [facility];  Service: Vascular;  Laterality: Left;  . [place] Right 02/20/2018   Procedure: Brachiocephalic Fistula creation right upper arm;  Surgeon: Marty Heck, MD;  Location: [facility];  Service: Vascular;  Laterality: Right;  . ECTOPIC PREGNANCY SURGERY  2018  . INSERTION OF DIALYSIS CATHETER Right 01/17/2018   Procedure: Insertion Of Dialysis Catheter;  Surgeon: Serafina Mitchell, MD;  Location: [facility] [facility];  Service: Cardiovascular;  Laterality: Right;  . LIGATION OF ARTERIOVENOUS  FISTULA Left 01/19/2018   Procedure: LIGATION OF ARTERIOVENOUS  FISTULA left  ARM;  Surgeon: Serafina Mitchell, MD;  Location: [facility];  Service:  Vascular;  Laterality: Left;  . REPAIR OF ACUTE ASCENDING THORACIC AORTIC DISSECTION       OB History   None      Home Medications    Prior to Admission medications   Medication Sig Start Date End Date Taking? Authorizing Provider  amLODipine (NORVASC) 10 MG tablet Take 1 tablet (10 mg total) by mouth daily. for high blood pressure 01/23/18  Yes Gherghe, Vella Redhead, MD  cloNIDine (CATAPRES) 0.2 MG tablet Take 1 tablet (0.2 mg total) by mouth 3 (three) times daily. 01/23/18  Yes Caren Griffins, MD  hydrALAZINE (APRESOLINE) 100 MG tablet Take 1 tablet (100 mg total)  by mouth 3 (three) times daily. 01/23/18  Yes Caren Griffins, MD  atorvastatin (LIPITOR) 80 MG tablet Take 1 tablet (80 mg total) by mouth daily at 6 PM. Patient not taking: Reported on 04/18/2018 02/21/18   Lorella Nimrod, MD  calcitRIOL (ROCALTROL) 0.25 MCG capsule Take 1 capsule (0.25 mcg total) by mouth daily. Patient not taking: Reported on 02/14/2018 01/23/18   Caren Griffins, MD  ferrous sulfate 325 (65 FE) MG tablet Take 1 tablet (325 mg total) by mouth 2 (two) times daily with a meal. Patient not taking: Reported on 02/18/2018 01/23/18   Caren Griffins, MD  HYDROcodone-acetaminophen (NORCO) 5-325 MG tablet Take 1 tablet by mouth every 6 (six) hours as needed for moderate pain. Patient not taking: Reported on 04/18/2018 02/20/18   Dagoberto Ligas, PA-C  HYDROcodone-acetaminophen (NORCO/VICODIN) 5-325 MG tablet Take 1-2 tablets by mouth every 6 (six) hours as needed for moderate pain. Patient not taking: Reported on 04/18/2018 02/21/18   Lorella Nimrod, MD  isosorbide mononitrate (IMDUR) 120 MG 24 hr tablet Take 1 tablet (120 mg total) by mouth daily. Patient not taking: Reported on 04/18/2018 01/23/18   Caren Griffins, MD  multivitamin (RENA-VIT) TABS tablet Take 1 tablet by mouth at bedtime. Patient not taking: Reported on 04/18/2018 02/21/18   Lorella Nimrod, MD  sodium bicarbonate 650 MG tablet Take 1 tablet (650 mg total) by mouth 2 (two) times daily. Patient not taking: Reported on 04/18/2018 01/23/18   Caren Griffins, MD    Family History Family History  Problem Relation Age of Onset  . Hypertension Mother     Social History Social History   Tobacco Use  . Smoking status: Former Smoker    Packs/day: 0.50    Years: 4.00    Pack years: 2.00    Types: Cigarettes  . Smokeless tobacco: Never Used  . Tobacco comment: 12/15/2017  Substance Use Topics  . Alcohol use: Not Currently  . Drug use: Not Currently    Types: Cocaine, Benzodiazepines    Comment: Last use 08/2017      Allergies   Patient has no known allergies.   Review of Systems Review of Systems 10 Systems reviewed and are negative for acute change except as noted in the HPI.   Physical Exam Updated Vital Signs BP (!) 162/84   Pulse 85   Temp 98.4 F (36.9 C) (Oral)   Resp 16   Ht 5\' 3"  (1.6 m)   Wt 54.4 kg   LMP 04/12/2018   SpO2 95%   BMI 21.26 kg/m   Physical Exam  Constitutional: She is oriented to person, place, and time.  Patient is sleeping comfortably as I come into the room.  No respiratory distress.  She awakens to voice and light stimulus.  Awake, mental status is normal.  Patient is pleasant  and interactive.  HENT:  Head: Normocephalic and atraumatic.  Mouth/Throat: Oropharynx is clear and moist.  Eyes: EOM are normal.  Neck: Neck supple.  Cardiovascular: Normal rate, regular rhythm, normal heart sounds and intact distal pulses.  Pulmonary/Chest: Effort normal and breath sounds normal.  Abdominal: Soft. She exhibits no distension. There is no tenderness. There is no guarding.  Musculoskeletal: Normal range of motion. She exhibits no edema or tenderness.  Neurological: She is alert and oriented to person, place, and time. No cranial nerve deficit. She exhibits normal muscle tone. Coordination normal.  Skin: Skin is warm and dry.  Psychiatric: She has a normal mood and affect.     ED Treatments / Results  Labs (all labs ordered are listed, but only abnormal results are displayed) Labs Reviewed  COMPREHENSIVE METABOLIC PANEL - Abnormal; Notable for the following components:      Result Value   Potassium 3.0 (*)    Glucose, Bld 103 (*)    Creatinine, Ser 3.98 (*)    Calcium 7.7 (*)    Alkaline Phosphatase 168 (*)    GFR calc non Af Amer 14 (*)    GFR calc Af Amer 16 (*)    All other components within normal limits  CBC WITH DIFFERENTIAL/PLATELET - Abnormal; Notable for the following components:   RBC 3.73 (*)    Hemoglobin 10.3 (*)    HCT 32.4 (*)     RDW 17.4 (*)    Platelets 123 (*)    All other components within normal limits  I-STAT TROPONIN, ED    EKG EKG Interpretation  Date/Time:  Tuesday April 18 2018 19:41:41 EST Ventricular Rate:  86 PR Interval:    QRS Duration: 71 QT Interval:  383 QTC Calculation: 459 R Axis:   -25 Text Interpretation:  Sinus rhythm Probable left atrial enlargement Probable left ventricular hypertrophy no sig change from previous Confirmed by Charlesetta Shanks [phone]) on 04/18/2018 8:35:46 PM   Radiology Dg Chest 2 View  Result Date: 04/18/2018 CLINICAL DATA:  Cough beginning yesterday. End-stage renal disease on dialysis. Congestive heart failure. EXAM: CHEST - 2 VIEW COMPARISON:  02/18/2018 FINDINGS: Stable moderate cardiomegaly. Prior median sternotomy. Right jugular dual-lumen central venous catheter remains in appropriate position. Both lungs are clear. No evidence of pleural effusion. IMPRESSION: Stable cardiomegaly.  No active lung disease. Electronically Signed   By: Earle Gell M.D.   On: 04/18/2018 20:31    Procedures Procedures (including critical care time) No critical care time Medications Ordered in ED Medications  isosorbide mononitrate (IMDUR) 24 hr tablet 120 mg (120 mg Oral Given 04/18/18 2139)  hydrALAZINE (APRESOLINE) injection 20 mg (20 mg Intravenous Given 04/18/18 1951)  cloNIDine (CATAPRES) tablet 0.2 mg (0.2 mg Oral Given 04/18/18 1950)  labetalol (NORMODYNE,TRANDATE) injection 20 mg (20 mg Intravenous Given 04/18/18 1951)  amLODipine (NORVASC) tablet 10 mg (10 mg Oral Given 04/18/18 1950)  hydrALAZINE (APRESOLINE) tablet 50 mg (50 mg Oral Given 04/18/18 1950)  hydrALAZINE (APRESOLINE) injection 20 mg (20 mg Intravenous Given 04/18/18 2139)     Initial Impression / Assessment and Plan / ED Course  I have reviewed the triage vital signs and the nursing notes.  Pertinent labs & imaging results that were available during my care of the patient were reviewed by me and  considered in my medical decision making (see chart for details).    Patient is sent from dialysis with hypertension.  She had missed several sessions.  She denies any shortness of breath.  Patient's  lungs are clear.  She does not have headache.  No signs of endorgan damage.  Patient is treated with A combination of IV hydralazine and her oral home medications.  Patient's blood pressure has come down to 160s over 80s.  She remains asymptomatic.  Her mental status is clear.  She has no complaints.  Patient will take her evening dose of hydralazine 100 mg and clonidine 0.2 mg upon going home.  She reports she lives with her sister.  She is counseled on return precautions. Final Clinical Impressions(s) / ED Diagnoses   Final diagnoses:  Hypertensive urgency  ESRD (end stage renal disease) on dialysis [facility])    ED Discharge Orders    None       Charlesetta Shanks, MD 04/18/18 2233

## 2018-04-18 NOTE — ED Notes (Signed)
Patient verbalizes understanding of medications and discharge instructions. No further questions at this time. VSS and patient ambulatory at discharge.   

## 2018-04-19 ENCOUNTER — Encounter: Payer: Self-pay | Admitting: Vascular Surgery

## 2018-05-09 ENCOUNTER — Encounter: Payer: Self-pay | Admitting: Vascular Surgery

## 2018-05-09 ENCOUNTER — Inpatient Hospital Stay (HOSPITAL_COMMUNITY): Admission: RE | Admit: 2018-05-09 | Payer: Self-pay | Source: Ambulatory Visit

## 2018-05-11 ENCOUNTER — Encounter: Payer: Self-pay | Admitting: Vascular Surgery

## 2018-05-21 ENCOUNTER — Emergency Department (HOSPITAL_COMMUNITY)
Admission: EM | Admit: 2018-05-21 | Discharge: 2018-05-21 | Disposition: A | Discharge status: Home / Self Care | Payer: Medicaid Other | Attending: Emergency Medicine | Admitting: Emergency Medicine

## 2018-05-21 ENCOUNTER — Encounter (HOSPITAL_COMMUNITY): Payer: Self-pay

## 2018-05-21 DIAGNOSIS — Z79899 Other long term (current) drug therapy: Secondary | ICD-10-CM | POA: Diagnosis not present

## 2018-05-21 DIAGNOSIS — I10 Essential (primary) hypertension: Secondary | ICD-10-CM

## 2018-05-21 DIAGNOSIS — I132 Hypertensive heart and chronic kidney disease with heart failure and with stage 5 chronic kidney disease, or end stage renal disease: Secondary | ICD-10-CM | POA: Diagnosis not present

## 2018-05-21 DIAGNOSIS — Z992 Dependence on renal dialysis: Secondary | ICD-10-CM | POA: Insufficient documentation

## 2018-05-21 DIAGNOSIS — I509 Heart failure, unspecified: Secondary | ICD-10-CM | POA: Diagnosis not present

## 2018-05-21 DIAGNOSIS — N186 End stage renal disease: Secondary | ICD-10-CM

## 2018-05-21 DIAGNOSIS — Z01812 Encounter for preprocedural laboratory examination: Secondary | ICD-10-CM | POA: Diagnosis present

## 2018-05-21 DIAGNOSIS — Z87891 Personal history of nicotine dependence: Secondary | ICD-10-CM | POA: Insufficient documentation

## 2018-05-21 LAB — CBC WITH DIFFERENTIAL/PLATELET
Abs Immature Granulocytes: 0.01 10*3/uL (ref 0.00–0.07)
BASOS PCT: 1 %
Basophils Absolute: 0.1 10*3/uL (ref 0.0–0.1)
EOS ABS: 0.4 10*3/uL (ref 0.0–0.5)
EOS PCT: 11 %
HCT: 31.1 % — ABNORMAL LOW (ref 36.0–46.0)
Hemoglobin: 9.6 g/dL — ABNORMAL LOW (ref 12.0–15.0)
Immature Granulocytes: 0 %
Lymphocytes Relative: 25 %
Lymphs Abs: 0.9 10*3/uL (ref 0.7–4.0)
MCH: 28.2 pg (ref 26.0–34.0)
MCHC: 30.9 g/dL (ref 30.0–36.0)
MCV: 91.2 fL (ref 80.0–100.0)
Monocytes Absolute: 0.4 10*3/uL (ref 0.1–1.0)
Monocytes Relative: 11 %
Neutro Abs: 1.9 10*3/uL (ref 1.7–7.7)
Neutrophils Relative %: 52 %
PLATELETS: 150 10*3/uL (ref 150–400)
RBC: 3.41 MIL/uL — ABNORMAL LOW (ref 3.87–5.11)
RDW: 16.9 % — AB (ref 11.5–15.5)
WBC: 3.6 10*3/uL — ABNORMAL LOW (ref 4.0–10.5)
nRBC: 0 % (ref 0.0–0.2)

## 2018-05-21 LAB — COMPREHENSIVE METABOLIC PANEL
ALT: 8 U/L (ref 0–44)
ANION GAP: 7 (ref 5–15)
AST: 11 U/L — ABNORMAL LOW (ref 15–41)
Albumin: 3.7 g/dL (ref 3.5–5.0)
Alkaline Phosphatase: 150 U/L — ABNORMAL HIGH (ref 38–126)
BUN: 41 mg/dL — AB (ref 6–20)
CO2: 18 mmol/L — ABNORMAL LOW (ref 22–32)
Calcium: 8.3 mg/dL — ABNORMAL LOW (ref 8.9–10.3)
Chloride: 115 mmol/L — ABNORMAL HIGH (ref 98–111)
Creatinine, Ser: 6.07 mg/dL — ABNORMAL HIGH (ref 0.44–1.00)
GFR, EST AFRICAN AMERICAN: 10 mL/min — AB (ref >60)
GFR, EST NON AFRICAN AMERICAN: 9 mL/min — AB (ref >60)
Glucose, Bld: 92 mg/dL (ref 70–99)
POTASSIUM: 4.5 mmol/L (ref 3.5–5.1)
Sodium: 140 mmol/L (ref 135–145)
TOTAL PROTEIN: 6.5 g/dL (ref 6.5–8.1)
Total Bilirubin: 0.4 mg/dL (ref 0.3–1.2)

## 2018-05-21 MED ORDER — HYDRALAZINE HCL 50 MG PO TABS
100.0000 mg | ORAL_TABLET | Freq: Three times a day (TID) | ORAL | Status: DC
  Administered 2018-05-21: 100 mg via ORAL
  Filled 2018-05-21 (×2): qty 2

## 2018-05-21 MED ORDER — CLONIDINE HCL 0.1 MG PO TABS
0.2000 mg | ORAL_TABLET | Freq: Three times a day (TID) | ORAL | Status: DC
  Administered 2018-05-21: 0.2 mg via ORAL
  Filled 2018-05-21: qty 2

## 2018-05-21 NOTE — ED Triage Notes (Signed)
Pt presents with need for a check-up. Pt reports that she missed her dialysis appointment yesterday and today she was told by someone at dialysis that she would need to be evaluated and have an assessment to rule out any issues before she could return to dialysis. Pt has no complaints.

## 2018-05-21 NOTE — Discharge Instructions (Addendum)
Your evaluated today for lab work.  Your lab work is at your baseline.  Please follow-up with dialysis as soon as possible to get on your regularly scheduled plan.  He did have elevated blood pressure in department.  We gave your home medications with resolution of her symptoms.  Please follow-up with your PCP for reevaluation.  Return to the ED for any new or worsening symptoms.

## 2018-05-21 NOTE — ED Provider Notes (Signed)
[facility] DEPT Provider Note   CSN: [id number] Arrival date & time: 05/21/18  1309   History   Chief Complaint Chief Complaint  Patient presents with  . Follow-up    HPI Valerie Mathis is a 33 y.o. female with past medical history significant for ESRD on hemodialysis(T,R,S), hypertension, CHF, history of aortic dissection, cardiomyopathy, cocaine abuse who presents for evaluation of lab work.  Patient states she missed dialysis the last 2 days secondary to transportation issues.  Patient states she called them yesterday evening and they told her she needs to be evaluated in the emergency department to "rule out any issues before I return to dialysis."  Patient has no complaints at this time.  Denies fever, chills, nausea, vomiting, chest pain, shortness of breath, headache, vision changes, abdominal pain, diarrhea, dysuria.  Patient states she does have an appointment and transportation with [employer] for tomorrow, Monday for dialysis if her laboratory work is normal during her visit.  Patient states "I feel great."  Denies lower extremity swelling.  History obtained from patient.  No interpreter was used.  HPI  Past Medical History:  Diagnosis Date  . Aortic dissection ([place])   . CHF (congestive heart failure) ([place])   . ESRD on hemodialysis ([place])   . H/O: alcohol abuse   . Hypertension   . Pneumonia   . Renal disorder   . TTP (thrombotic thrombocytopenic purpura) [facility])     Patient Active Problem List   Diagnosis Date Noted  . Normocytic anemia 02/19/2018  . ESRD on dialysis ([place]) 02/19/2018  . Chest pain 02/18/2018  . Cocaine abuse ([place])   . ARF (acute renal failure) ([place]) 11/29/2017  . History of LUE DVT (deep vein thrombosis) 10/21/2017  . Abdominal pain 10/20/2017  . Uncontrolled hypertension 10/20/2017  . Hypertensive cardiomyopathy ([place]) 10/20/2017  . 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 Hypertensive heart disease without heart failure   .  Cocaine use   . Hypertensive emergency 09/12/2017  . Back pain 09/12/2017  . Tachycardia 09/12/2017  . Chronic kidney disease Stage IV-V   . 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 A/V FISTULAGRAM Left 01/17/2018   Procedure: A/V Fistulagram;  Surgeon: Serafina Mitchell, MD;  Location: [place] [facility];  Service: Cardiovascular;  Laterality: Left;  . AV FISTULA PLACEMENT Left 12/23/2017   Procedure: ARTERIOVENOUS (AV) FISTULA CREATION  LEFT ARM;  Surgeon: Rosetta Posner, MD;  Location: [facility];  Service: Vascular;  Laterality: Left;  . [place] Right 02/20/2018   Procedure: Brachiocephalic Fistula creation right upper arm;  Surgeon: Marty Heck, MD;  Location: [place];  Service: Vascular;  Laterality: Right;  . ECTOPIC PREGNANCY SURGERY  2018  . INSERTION OF DIALYSIS CATHETER Right 01/17/2018   Procedure: Insertion Of Dialysis Catheter;  Surgeon: Serafina Mitchell, MD;  Location: [facility];  Service: Cardiovascular;  Laterality: Right;  . LIGATION OF ARTERIOVENOUS  FISTULA Left 01/19/2018   Procedure: LIGATION OF ARTERIOVENOUS  FISTULA left  ARM;  Surgeon: Serafina Mitchell, MD;  Location: [facility];  Service: Vascular;  Laterality: Left;  . REPAIR OF ACUTE ASCENDING THORACIC AORTIC DISSECTION       OB History   No obstetric history on file.      Home Medications    Prior to Admission medications   Medication Sig Start Date End Date Taking? Authorizing Provider  amLODipine (NORVASC) 10 MG  tablet Take 1 tablet (10 mg total) by mouth daily. for high blood pressure 01/23/18  Yes Gherghe, Vella Redhead, MD  cloNIDine (CATAPRES) 0.2 MG tablet Take 1 tablet (0.2 mg total) by mouth 3 (three) times daily. 01/23/18  Yes Caren Griffins, MD  hydrALAZINE (APRESOLINE) 100 MG tablet Take 1 tablet (100 mg total) by mouth 3 (three) times daily. 01/23/18  Yes Caren Griffins, MD  atorvastatin (LIPITOR) 80 MG tablet Take 1 tablet (80 mg  total) by mouth daily at 6 PM. Patient not taking: Reported on 04/18/2018 02/21/18   Lorella Nimrod, MD  calcitRIOL (ROCALTROL) 0.25 MCG capsule Take 1 capsule (0.25 mcg total) by mouth daily. Patient not taking: Reported on 02/14/2018 01/23/18   Caren Griffins, MD  ferrous sulfate 325 (65 FE) MG tablet Take 1 tablet (325 mg total) by mouth 2 (two) times daily with a meal. Patient not taking: Reported on 02/18/2018 01/23/18   Caren Griffins, MD  HYDROcodone-acetaminophen (NORCO) 5-325 MG tablet Take 1 tablet by mouth every 6 (six) hours as needed for moderate pain. Patient not taking: Reported on 04/18/2018 02/20/18   Dagoberto Ligas, PA-C  HYDROcodone-acetaminophen (NORCO/VICODIN) 5-325 MG tablet Take 1-2 tablets by mouth every 6 (six) hours as needed for moderate pain. Patient not taking: Reported on 04/18/2018 02/21/18   Lorella Nimrod, MD  isosorbide mononitrate (IMDUR) 120 MG 24 hr tablet Take 1 tablet (120 mg total) by mouth daily. Patient not taking: Reported on 04/18/2018 01/23/18   Caren Griffins, MD  multivitamin (RENA-VIT) TABS tablet Take 1 tablet by mouth at bedtime. Patient not taking: Reported on 04/18/2018 02/21/18   Lorella Nimrod, MD  sodium bicarbonate 650 MG tablet Take 1 tablet (650 mg total) by mouth 2 (two) times daily. Patient not taking: Reported on 04/18/2018 01/23/18   Caren Griffins, MD    Family History Family History  Problem Relation Age of Onset  . Hypertension Mother     Social History Social History   Tobacco Use  . Smoking status: Former Smoker    Packs/day: 0.50    Years: 4.00    Pack years: 2.00    Types: Cigarettes  . Smokeless tobacco: Never Used  . Tobacco comment: 12/15/2017  Substance Use Topics  . Alcohol use: Not Currently  . Drug use: Not Currently    Types: Cocaine, Benzodiazepines    Comment: Last use 08/2017     Allergies   Patient has no known allergies.   Review of Systems Review of Systems  Constitutional: Negative.   HENT:  Negative.   Eyes: Negative.   Respiratory: Negative.   Cardiovascular: Negative.   Gastrointestinal: Negative.   Genitourinary: Negative.   Musculoskeletal: Negative.   Skin: Negative.   Neurological: Negative.   All other systems reviewed and are negative.    Physical Exam Updated Vital Signs BP (!) 186/97   Pulse 65   Temp 97.6 F (36.4 C) (Oral)   Resp 16   Ht 5\' 3"  (1.6 m)   LMP 05/19/2018 (Approximate)   SpO2 95%   BMI 21.26 kg/m   Physical Exam Vitals signs and nursing note reviewed.  Constitutional:      General: She is not in acute distress.    Appearance: She is well-developed. She is not ill-appearing, toxic-appearing or diaphoretic.  HENT:     Head: Normocephalic and atraumatic.     Jaw: There is normal jaw occlusion.     Right Ear: Tympanic membrane, ear canal and external ear  normal. There is no impacted cerumen.     Left Ear: Tympanic membrane, ear canal and external ear normal. There is no impacted cerumen.     Nose: Nose normal. No congestion or rhinorrhea.     Right Sinus: No maxillary sinus tenderness or frontal sinus tenderness.     Left Sinus: No maxillary sinus tenderness or frontal sinus tenderness.     Mouth/Throat:     Lips: Pink. No lesions.     Mouth: Mucous membranes are moist.     Pharynx: Oropharynx is clear. Uvula midline. No pharyngeal swelling, oropharyngeal exudate, posterior oropharyngeal erythema or uvula swelling.     Tonsils: No tonsillar exudate or tonsillar abscesses. Swelling: 0 on the right. 0 on the left.     Comments: Tonsils without edema or exudate.  Oropharynx without erythema or exudate.  Mucous membranes moist without lesions. Eyes:     Pupils: Pupils are equal, round, and reactive to light.  Neck:     Musculoskeletal: Normal range of motion.     Comments: No stiffness or rigidity. Cardiovascular:     Rate and Rhythm: Normal rate.     Pulses: Normal pulses.     Heart sounds: Murmur present. No friction rub. No  gallop.      Comments: Systolic murmur Pulmonary:     Effort: Pulmonary effort is normal. No respiratory distress.     Breath sounds: Normal breath sounds. No stridor. No wheezing, rhonchi or rales.     Comments: Clear to auscultation without wheeze, rhonchi or rales.  Oxygen saturation 98% on room air with good waveform.  She does not appear in any acute respiratory distress.  Normal work of breathing. Chest:     Chest wall: No tenderness.     Comments: Dialysis access in right upper chest. No tenderness, erythema to area. Abdominal:     General: Bowel sounds are normal. There is no distension.     Tenderness: There is no abdominal tenderness. There is no right CVA tenderness, left CVA tenderness, guarding or rebound.     Comments: Soft, nontender without rebound or guarding.  Musculoskeletal: Normal range of motion.     Comments: Lower extremities without edema.  Moves all extremities without difficulty.  Ambulates in room without difficulty.  Skin:    General: Skin is warm and dry.     Comments: No edema, rashes or lesions.  Neurological:     Mental Status: She is alert.     Comments: Intact sensation bilateral upper and lower extremities.      ED Treatments / Results  Labs (all labs ordered are listed, but only abnormal results are displayed) Labs Reviewed  CBC WITH DIFFERENTIAL/PLATELET - Abnormal; Notable for the following components:      Result Value   WBC 3.6 (*)    RBC 3.41 (*)    Hemoglobin 9.6 (*)    HCT 31.1 (*)    RDW 16.9 (*)    All other components within normal limits  COMPREHENSIVE METABOLIC PANEL - Abnormal; Notable for the following components:   Chloride 115 (*)    CO2 18 (*)    BUN 41 (*)    Creatinine, Ser 6.07 (*)    Calcium 8.3 (*)    AST 11 (*)    Alkaline Phosphatase 150 (*)    GFR calc non Af Amer 9 (*)    GFR calc Af Amer 10 (*)    All other components within normal limits    EKG None  Radiology No results  found.  Procedures Procedures (including critical care time)  Medications Ordered in ED Medications  cloNIDine (CATAPRES) tablet 0.2 mg (0.2 mg Oral Given 05/21/18 1834)  hydrALAZINE (APRESOLINE) tablet 100 mg (100 mg Oral Given 05/21/18 1834)     Initial Impression / Assessment and Plan / ED Course  I have reviewed the triage vital signs and the nursing notes.  Pertinent labs & imaging results that were available during my care of the patient were reviewed by me and considered in my medical decision making (see chart for details).   33 year old female who appears otherwise well presents for evaluation of lab work.  Patient with end-stage renal failure on hemodialysis, T,R,S has missed her last 2 sessions.  Patient went to schedule dialysis and they told her she needs to be evaluated in the emergency department prior to returning.  She has no complaints at this time.  Does not look in fluid overload.  Lungs clear to auscultation bilaterally without wheeze, rhonchi or rales.  No shortness of breath.  Abdomen soft, nontender without rebound or guarding.  Lower extremities without edema.  Will obtain labs and reevaluate.  She has no complaints at this time.  1710: Nursing has notified me that they are having difficulty with IV access. Will continue to monitor.  1845: CMP, CBC at patient's baseline. No indication for emergent HD. Noted to have elevated blood pressure of 214/108 reevaluation.  Initial triage vitals are 166/91.  Patient is asymptomatic without any headache, vision changes, chest pain, shortness of breath, abdominal pain, nausea or vomiting.  Patient does take amlodipine, clonidine and hydralazine 3 times daily for her blood pressure.  Patient states she is due for her blood pressure medicines.  We will give her home medications and reevaluate.  Low suspicion for hypertensive urgency or emergency at this time.  2000: On reevaluation blood pressure to 186/97.  Patient asymptomatic at  this time.  Discussed follow-up with dialysis.  Patient states she ready has Transportation scheduled for tomorrow.  Discussed follow-up with PCP for reevaluation.  Patient is hemodynamically stable and appropriate for DC home at this time.  Patient voiced understanding of return precautions and is agreeable for follow-up.  Patient has been personally seen and evaluated by my attending, Dr. Vanita Panda.  He agrees with above treatment, plan and disposition.    Final Clinical Impressions(s) / ED Diagnoses   Final diagnoses:  ESRD (end stage renal disease) on dialysis [facility])  Hypertension, unspecified type    ED Discharge Orders    None       Sharman Garrott A, PA-C 05/21/18 2049    Carmin Muskrat, MD 05/22/18 0006

## 2018-06-12 ENCOUNTER — Emergency Department (HOSPITAL_COMMUNITY): Payer: Medicaid Other

## 2018-06-12 ENCOUNTER — Other Ambulatory Visit: Payer: Self-pay

## 2018-06-12 ENCOUNTER — Encounter (HOSPITAL_COMMUNITY): Payer: Self-pay | Admitting: Emergency Medicine

## 2018-06-12 ENCOUNTER — Encounter (HOSPITAL_COMMUNITY): Payer: Self-pay | Admitting: *Deleted

## 2018-06-12 ENCOUNTER — Telehealth: Payer: Self-pay

## 2018-06-12 ENCOUNTER — Emergency Department (HOSPITAL_COMMUNITY)
Admission: EM | Admit: 2018-06-12 | Discharge: 2018-06-12 | Disposition: A | Discharge status: Home / Self Care | Payer: Medicaid Other | Attending: Emergency Medicine | Admitting: Emergency Medicine

## 2018-06-12 ENCOUNTER — Emergency Department (HOSPITAL_COMMUNITY)
Admission: EM | Admit: 2018-06-12 | Discharge: 2018-06-12 | Disposition: A | Discharge status: Home / Self Care | Payer: Medicaid Other | Source: Home / Self Care | Attending: Emergency Medicine | Admitting: Emergency Medicine

## 2018-06-12 DIAGNOSIS — R0600 Dyspnea, unspecified: Secondary | ICD-10-CM | POA: Diagnosis not present

## 2018-06-12 DIAGNOSIS — N186 End stage renal disease: Secondary | ICD-10-CM

## 2018-06-12 DIAGNOSIS — Z5321 Procedure and treatment not carried out due to patient leaving prior to being seen by health care provider: Secondary | ICD-10-CM | POA: Insufficient documentation

## 2018-06-12 DIAGNOSIS — I10 Essential (primary) hypertension: Secondary | ICD-10-CM

## 2018-06-12 DIAGNOSIS — R1084 Generalized abdominal pain: Secondary | ICD-10-CM | POA: Insufficient documentation

## 2018-06-12 DIAGNOSIS — I132 Hypertensive heart and chronic kidney disease with heart failure and with stage 5 chronic kidney disease, or end stage renal disease: Secondary | ICD-10-CM | POA: Insufficient documentation

## 2018-06-12 DIAGNOSIS — I509 Heart failure, unspecified: Secondary | ICD-10-CM | POA: Diagnosis not present

## 2018-06-12 DIAGNOSIS — Z87891 Personal history of nicotine dependence: Secondary | ICD-10-CM | POA: Insufficient documentation

## 2018-06-12 DIAGNOSIS — M549 Dorsalgia, unspecified: Secondary | ICD-10-CM | POA: Insufficient documentation

## 2018-06-12 DIAGNOSIS — Z9114 Patient's other noncompliance with medication regimen: Secondary | ICD-10-CM

## 2018-06-12 DIAGNOSIS — Z992 Dependence on renal dialysis: Secondary | ICD-10-CM

## 2018-06-12 DIAGNOSIS — R079 Chest pain, unspecified: Secondary | ICD-10-CM | POA: Diagnosis present

## 2018-06-12 LAB — CBC
HCT: 32.2 % — ABNORMAL LOW (ref 36.0–46.0)
Hemoglobin: 10.4 g/dL — ABNORMAL LOW (ref 12.0–15.0)
MCH: 28.3 pg (ref 26.0–34.0)
MCHC: 32.3 g/dL (ref 30.0–36.0)
MCV: 87.7 fL (ref 80.0–100.0)
NRBC: 0.3 % — AB (ref 0.0–0.2)
Platelets: 119 10*3/uL — ABNORMAL LOW (ref 150–400)
RBC: 3.67 MIL/uL — ABNORMAL LOW (ref 3.87–5.11)
RDW: 18.6 % — ABNORMAL HIGH (ref 11.5–15.5)
WBC: 6.6 10*3/uL (ref 4.0–10.5)

## 2018-06-12 LAB — COMPREHENSIVE METABOLIC PANEL
ALT: 24 U/L (ref 0–44)
ANION GAP: 15 (ref 5–15)
AST: 40 U/L (ref 15–41)
Albumin: 4.1 g/dL (ref 3.5–5.0)
Alkaline Phosphatase: 150 U/L — ABNORMAL HIGH (ref 38–126)
BUN: 44 mg/dL — ABNORMAL HIGH (ref 6–20)
CO2: 21 mmol/L — ABNORMAL LOW (ref 22–32)
Calcium: 9.3 mg/dL (ref 8.9–10.3)
Chloride: 97 mmol/L — ABNORMAL LOW (ref 98–111)
Creatinine, Ser: 6.39 mg/dL — ABNORMAL HIGH (ref 0.44–1.00)
GFR calc Af Amer: 9 mL/min — ABNORMAL LOW (ref >60)
GFR calc non Af Amer: 8 mL/min — ABNORMAL LOW (ref >60)
GLUCOSE: 117 mg/dL — AB (ref 70–99)
Potassium: 3.6 mmol/L (ref 3.5–5.1)
Sodium: 133 mmol/L — ABNORMAL LOW (ref 135–145)
Total Bilirubin: 1.3 mg/dL — ABNORMAL HIGH (ref 0.3–1.2)
Total Protein: 7.1 g/dL (ref 6.5–8.1)

## 2018-06-12 LAB — I-STAT BETA HCG BLOOD, ED (MC, WL, AP ONLY): I-stat hCG, quantitative: <5 m[IU]/mL (ref <5)

## 2018-06-12 LAB — URINALYSIS, ROUTINE W REFLEX MICROSCOPIC
Bilirubin Urine: NEGATIVE
Glucose, UA: NEGATIVE mg/dL
Ketones, ur: NEGATIVE mg/dL
Nitrite: NEGATIVE
Protein, ur: 100 mg/dL — AB
Specific Gravity, Urine: 1.011 (ref 1.005–1.030)
WBC, UA: >50 WBC/hpf — ABNORMAL HIGH (ref 0–5)
pH: 6 (ref 5.0–8.0)

## 2018-06-12 LAB — LIPASE, BLOOD: Lipase: 21 U/L (ref 11–51)

## 2018-06-12 LAB — I-STAT TROPONIN, ED: TROPONIN I, POC: 0.03 ng/mL (ref 0.00–0.08)

## 2018-06-12 LAB — TROPONIN I: Troponin I: 0.06 ng/mL (ref <0.03)

## 2018-06-12 MED ORDER — ISOSORBIDE MONONITRATE ER 120 MG PO TB24: 120.0000 mg | ORAL_TABLET | Freq: Every day | ORAL | 0 refills | Status: AC

## 2018-06-12 MED ORDER — HYDRALAZINE HCL 50 MG PO TABS
100.0000 mg | ORAL_TABLET | Freq: Three times a day (TID) | ORAL | Status: DC
  Administered 2018-06-12 (×2): 100 mg via ORAL
  Filled 2018-06-12 (×3): qty 2

## 2018-06-12 MED ORDER — CLONIDINE HCL 0.2 MG PO TABS
0.2000 mg | ORAL_TABLET | Freq: Three times a day (TID) | ORAL | Status: DC
  Administered 2018-06-12 (×2): 0.2 mg via ORAL
  Filled 2018-06-12 (×2): qty 1

## 2018-06-12 MED ORDER — AMLODIPINE BESYLATE 10 MG PO TABS: 10.0000 mg | ORAL_TABLET | Freq: Every day | ORAL | 0 refills | Status: AC

## 2018-06-12 MED ORDER — HYDROCODONE-ACETAMINOPHEN 5-325 MG PO TABS
1.0000 | ORAL_TABLET | Freq: Four times a day (QID) | ORAL | Status: DC | PRN
  Administered 2018-06-12: 2 via ORAL
  Filled 2018-06-12: qty 2

## 2018-06-12 MED ORDER — CLONIDINE HCL 0.2 MG PO TABS: 0.2000 mg | ORAL_TABLET | Freq: Three times a day (TID) | ORAL | 0 refills | Status: AC

## 2018-06-12 MED ORDER — ISOSORBIDE MONONITRATE ER 30 MG PO TB24
120.0000 mg | ORAL_TABLET | Freq: Every day | ORAL | Status: DC
  Administered 2018-06-12: 120 mg via ORAL
  Filled 2018-06-12: qty 4

## 2018-06-12 MED ORDER — HYDRALAZINE HCL 100 MG PO TABS: 100.0000 mg | ORAL_TABLET | Freq: Three times a day (TID) | ORAL | 0 refills | Status: AC

## 2018-06-12 MED ORDER — AMLODIPINE BESYLATE 5 MG PO TABS
10.0000 mg | ORAL_TABLET | Freq: Every day | ORAL | Status: DC
  Administered 2018-06-12: 10 mg via ORAL
  Filled 2018-06-12: qty 2

## 2018-06-12 MED ORDER — HYDRALAZINE HCL 100 MG PO TABS: 100.0000 mg | ORAL_TABLET | Freq: Three times a day (TID) | ORAL | Status: DC

## 2018-06-12 MED ORDER — CLONIDINE HCL 0.2 MG PO TABS: 0.2000 mg | ORAL_TABLET | Freq: Three times a day (TID) | ORAL | Status: DC

## 2018-06-12 NOTE — ED Triage Notes (Addendum)
PT here earlier today for sob and abdominal pain, but left d/t wait.  Came back via [facility].  Missed dialysis on Sat d/t pain/out of town.  Abdomen distended, breathing labored.  Pt has been out of bp meds for 1 week.

## 2018-06-12 NOTE — Telephone Encounter (Signed)
Pt. Called requesting medication refills on prescriptions that Dr. Renne Crigler prescribed according to her med list. I explained to her that she would need to contact that provider's office, since he prescribed them. Pt. Verbalized understanding.

## 2018-06-12 NOTE — ED Provider Notes (Signed)
Emergency Department Provider Note   I have reviewed the triage vital signs and the nursing notes.   HISTORY  Chief Complaint Shortness of Breath (Missed dialysis)   HPI Valerie Mathis is a 33 y.o. female with past history of hypertension, end-stage renal disease on dialysis, aortic dissection other medical problems as documented below the presents the emergency department today secondary to chest pain, back pain, abdominal pain and now shortness of breath.  Patient states she missed dialysis on Saturday because of a trip.  She was in the emergency room earlier today and had labs drawn which were all resulted but she states that her pain was too bad so she was leaving to go to [place] long but then decided to go home.  This when she developed shortness of breath so her sister got scared and called ambulance and brought her in for further evaluation.  States she has not taken her antihypertensives in about a week secondary to not having PCP here. No other associated or modifying symptoms.    Past Medical History:  Diagnosis Date  . Aortic dissection ([place])   . CHF (congestive heart failure) ([place])   . ESRD on hemodialysis ([place])   . H/O: alcohol abuse   . Hypertension   . Pneumonia   . Renal disorder   . 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 Normocytic anemia 02/19/2018  . ESRD on dialysis ([place]) 02/19/2018  . Chest pain 02/18/2018  . Cocaine abuse ([place])   . ARF (acute renal failure) ([place]) 11/29/2017  . History of LUE DVT (deep vein thrombosis) 10/21/2017  . Abdominal pain 10/20/2017  . Uncontrolled hypertension 10/20/2017  . Hypertensive cardiomyopathy ([place]) 10/20/2017  . Dissection of aorta, thoracic ([place])   . Hypertensive heart disease without heart failure   . Cocaine use   . Hypertensive emergency 09/12/2017  . Back pain 09/12/2017  . Tachycardia 09/12/2017  . Chronic kidney disease Stage IV-V   .  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 A/V FISTULAGRAM Left 01/17/2018   Procedure: A/V Fistulagram;  Surgeon: Serafina Mitchell, MD;  Location: [facility];  Service: Cardiovascular;  Laterality: Left;  . AV FISTULA PLACEMENT Left 12/23/2017   Procedure: ARTERIOVENOUS (AV) FISTULA CREATION  LEFT ARM;  Surgeon: Rosetta Posner, MD;  Location: [facility];  Service: Vascular;  Laterality: Left;  . [place] Right 02/20/2018   Procedure: Brachiocephalic Fistula creation right upper arm;  Surgeon: Marty Heck, MD;  Location: [facility];  Service: Vascular;  Laterality: Right;  . ECTOPIC PREGNANCY SURGERY  2018  . INSERTION OF DIALYSIS CATHETER Right 01/17/2018   Procedure: Insertion Of Dialysis Catheter;  Surgeon: Serafina Mitchell, MD;  Location: [facility];  Service: Cardiovascular;  Laterality: Right;  . LIGATION OF ARTERIOVENOUS  FISTULA Left 01/19/2018   Procedure: LIGATION OF ARTERIOVENOUS  FISTULA left  ARM;  Surgeon: Serafina Mitchell, MD;  Location: [facility];  Service: Vascular;  Laterality: Left;  . REPAIR OF ACUTE ASCENDING THORACIC AORTIC DISSECTION      Current Outpatient Rx  . Order #: [id number] Class: Historical Med  . Order #: [id number] Class: Historical Med  . Order #: [id number] Class: Historical Med  . Order #: [id number] Class: Print  . Order #: [id number] Class: Print  . Order #: [id number] Class: Print  . Order #: [id number] Class: Print  . Order #:  [id number] Class: Print    Allergies Patient has no known allergies.  Family History  Problem Relation Age of Onset  . Hypertension Mother     Social History Social History   Tobacco Use  . Smoking status: Former Smoker    Packs/day: 0.50    Years: 4.00    Pack years: 2.00    Types: Cigarettes  . Smokeless tobacco: Never Used  . Tobacco comment: 12/15/2017  Substance Use Topics  . Alcohol use: Not Currently  . Drug use: Not Currently    Types: Cocaine,  Benzodiazepines    Comment: Last use 08/2017    Review of Systems  All other systems negative except as documented in the HPI. All pertinent positives and negatives as reviewed in the HPI. ____________________________________________   PHYSICAL EXAM:  VITAL SIGNS: Vitals:   06/12/18 2145 06/12/18 2200 06/12/18 2215 06/12/18 2300  BP:  (!) 185/112  (!) 180/126  Pulse: 93 98 97 95  Resp: 13 17 18 13   Temp:      TempSrc:      SpO2: 97% 97% 98% 99%    Constitutional: Alert and oriented. Well appearing and in no acute distress. Eyes: Conjunctivae are normal. PERRL. EOMI. Head: Atraumatic. Nose: No congestion/rhinnorhea. Mouth/Throat: Mucous membranes are moist.  Oropharynx non-erythematous. Neck: No stridor.  No meningeal signs.   Cardiovascular: tachycardic rate, regular rhythm. Good peripheral circulation. Grossly normal heart sounds.   Respiratory: Normal respiratory effort.  No retractions. Lungs CTAB. Gastrointestinal: Soft and nontender. Mild distention.  Musculoskeletal: No lower extremity tenderness nor edema. No gross deformities of extremities. Neurologic:  Normal speech and language. No gross focal neurologic deficits are appreciated.  Skin:  Skin is warm, dry and intact. No rash noted.   ____________________________________________   LABS (all labs ordered are listed, but only abnormal results are displayed)  Labs Reviewed  TROPONIN I - Abnormal; Notable for the following components:      Result Value   Troponin I 0.06 (*)    All other components within normal limits  URINE CULTURE   ____________________________________________  EKG   EKG Interpretation  Date/Time:    Ventricular Rate:    PR Interval:    QRS Duration:   QT Interval:    QTC Calculation:   R Axis:     Text Interpretation:         ____________________________________________  RADIOLOGY  No results  found.  ____________________________________________   PROCEDURES  Procedure(s) performed:   Procedures   ____________________________________________   INITIAL IMPRESSION / ASSESSMENT AND PLAN / ED COURSE  Symptoms likely related to hypertension. cxr and labs reviewed from a couple hours prior, no need to repeat currently. Will treat with home meds and pain meds and reassess for disposition.   imporoving symptoms. Negative workup. BP improving with home meds. Case management seen and will coordinate PCP follow up. Home med Rx's given in the interim. Will get dialysis tomorrow.      Pertinent labs & imaging results that were available during my care of the patient were reviewed by me and considered in my medical decision making (see chart for details).  ____________________________________________  FINAL CLINICAL IMPRESSION(S) / ED DIAGNOSES  Final diagnoses:  Dyspnea, unspecified type  Hypertension, unspecified type     MEDICATIONS GIVEN DURING THIS VISIT:  Medications - No data to display   NEW OUTPATIENT MEDICATIONS STARTED DURING THIS VISIT:  Discharge Medication List as of 06/12/2018 11:03 PM      Note:  This note was prepared  with assistance of Systems analyst. Occasional wrong-word or sound-a-like substitutions may have occurred due to the inherent limitations of voice recognition software.   Matalynn Graff, Corene Cornea, MD 06/14/18 [phone]

## 2018-06-12 NOTE — ED Triage Notes (Addendum)
Onset 2 days ago developed chest pain, general abdominal pain, and back pain. Has dialysis T,TH,Sat missed Sat due to pain. Alert answering and following commands appropriate.

## 2018-06-12 NOTE — ED Notes (Signed)
Per MD, okay to eat & drink.

## 2018-06-12 NOTE — Discharge Instructions (Addendum)
Please go to dialysis tomorrow

## 2018-06-14 LAB — URINE CULTURE: Culture: <10000 — AB

## 2018-06-27 ENCOUNTER — Inpatient Hospital Stay (HOSPITAL_COMMUNITY): Admission: RE | Admit: 2018-06-27 | Payer: Self-pay | Source: Ambulatory Visit

## 2018-06-27 ENCOUNTER — Encounter: Payer: Self-pay | Admitting: Vascular Surgery

## 2018-07-07 ENCOUNTER — Encounter: Payer: Self-pay | Admitting: Family Medicine

## 2018-07-12 ENCOUNTER — Ambulatory Visit: Payer: Medicaid Other | Admitting: Family Medicine

## 2018-12-05 ENCOUNTER — Other Ambulatory Visit: Payer: Self-pay

## 2018-12-05 DIAGNOSIS — N186 End stage renal disease: Secondary | ICD-10-CM

## 2018-12-05 DIAGNOSIS — Z992 Dependence on renal dialysis: Secondary | ICD-10-CM

## 2018-12-12 ENCOUNTER — Ambulatory Visit (HOSPITAL_COMMUNITY): Admission: RE | Admit: 2018-12-12 | Payer: Medicaid Other | Source: Ambulatory Visit

## 2018-12-12 ENCOUNTER — Encounter: Payer: Self-pay | Admitting: Vascular Surgery

## 2019-04-28 IMAGING — MR MR MRA ABDOMEN W/ OR W/O CM
7 of 11 series · 28 of 48 positions shown · IV contrast (Yes)
Comparison: 10/20/2017

CLINICAL DATA: History prior thoracic aortic dissection with repair
of ascending thoracic aortic dissection and residual chronic type B
dissection of the descending thoracic aorta. Now with chest pain,
hypertension and edema involving the left side of the body.
Iodinated contrast and gadolinium cannot be administered due to
significant renal failure.

EXAM:
MRA CHEST WITH OR WITHOUT CONTRAST; MRA ABDOMEN WITH OR WITHOUT
CONTRAST
TECHNIQUE: Angiographic images of the chest were obtained using MRA technique
without intravenous contrast.
CONTRAST:  None

[Series 3: bSSFP · sagittal · 8.0mm · 1.56mm/px · 1 of 26 slices shown (1 of 4)]
[im 1/26]
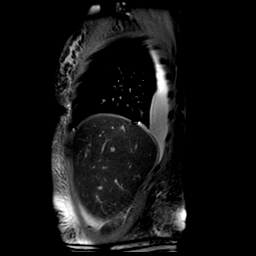

[Series 4: bSSFP · axial · 8.0mm · 1.37mm/px · z∈[-220,+188]mm · 3 of 52 slices shown (2 of 4)]
[im 1/52]
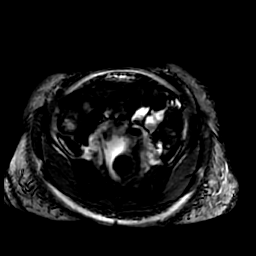
[im 26/52]
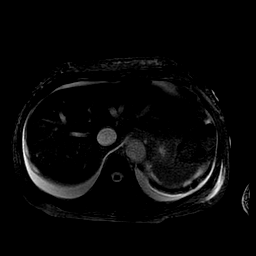
[im 52/52]
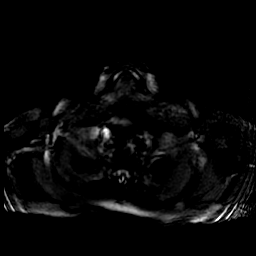

[Series 5: bSSFP · sagittal · 6.0mm · 1.48mm/px · 8 of 220 slices shown (3 of 4)]
[im 1/220]
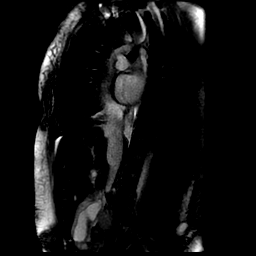
[im 34/220]
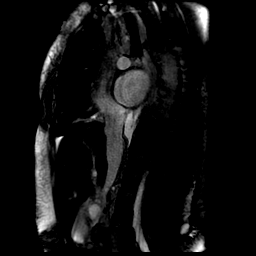
[im 68/220]
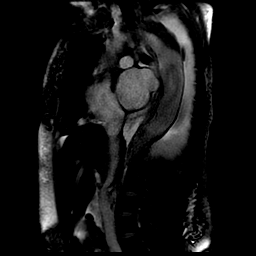
[im 102/220]
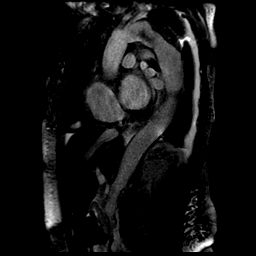
[im 118/220]
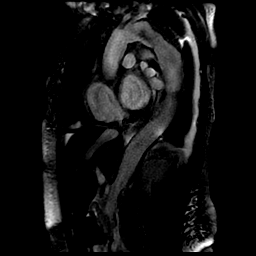
[im 152/220]
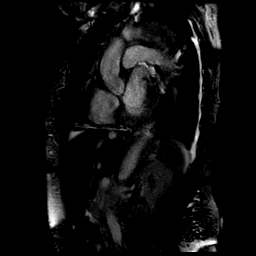
[im 186/220]
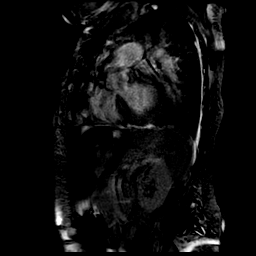
[im 220/220]
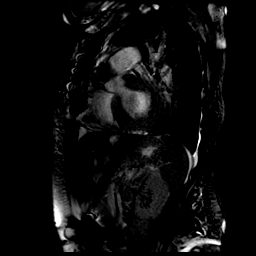

[Series 7: bSSFP · coronal · 8.0mm · 1.64mm/px · 2 of 27 slices shown (4 of 4)]
[im 1/27]
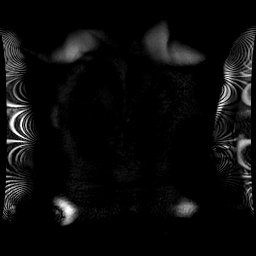
[im 27/27]
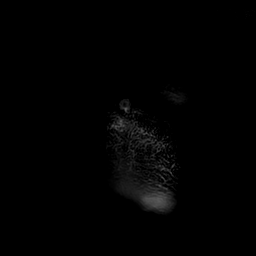

[Series 8: T1 dynamic · axial · non-contrast · 7.4mm · 0.78mm/px · z∈[-224,+187]mm · 7 of 112 slices shown (1 of 3)]
[im 1/112]
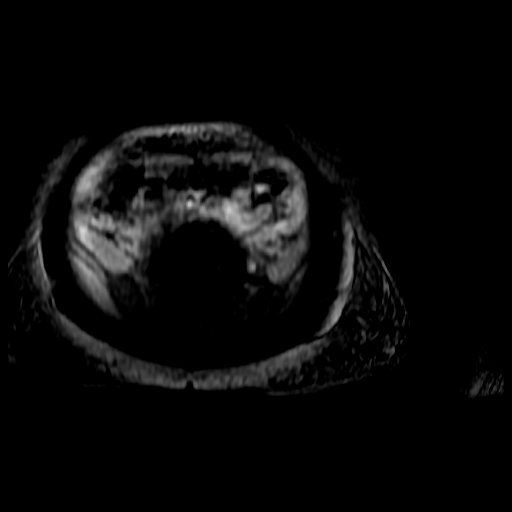
[im 19/112]
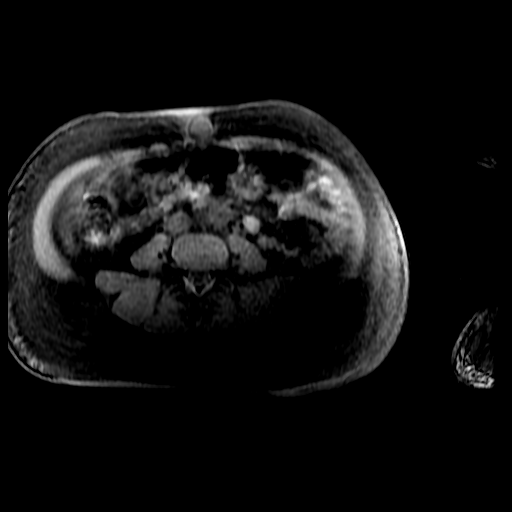
[im 38/112]
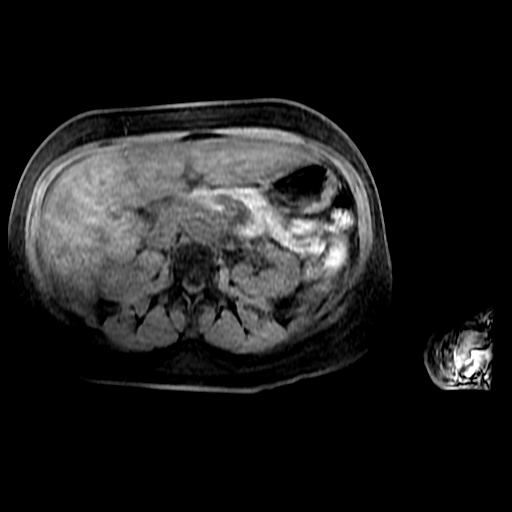
[im 56/112]
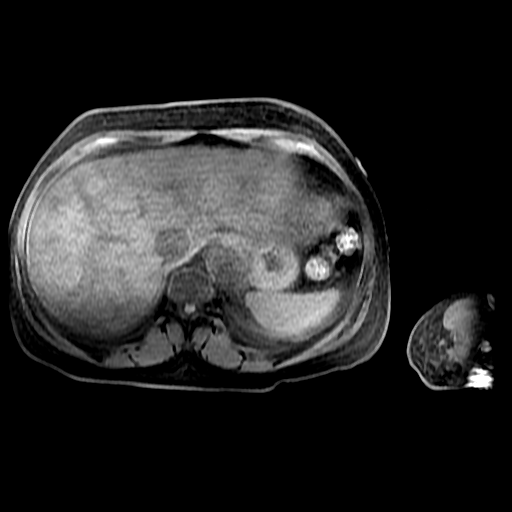
[im 75/112]
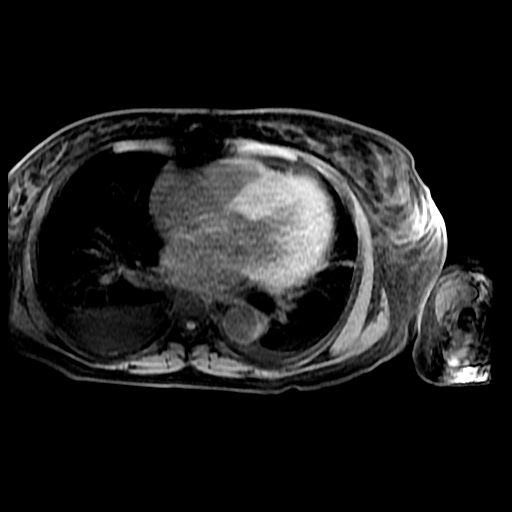
[im 93/112]
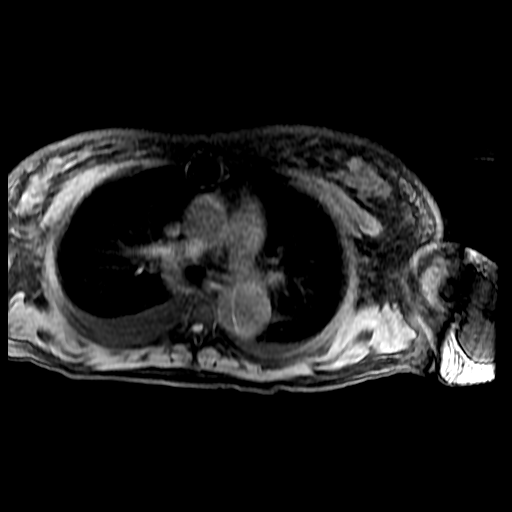
[im 112/112]
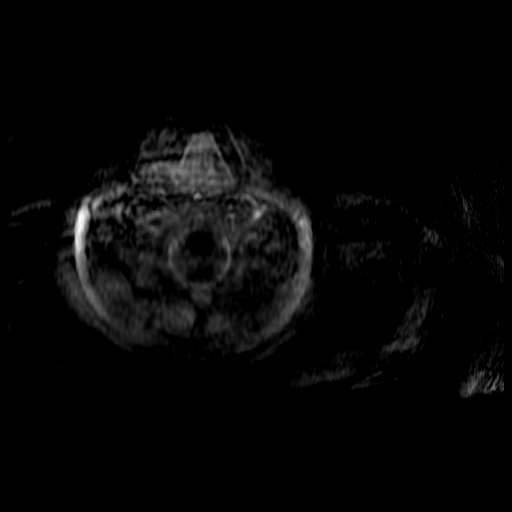

[Series 9: T1 dynamic · coronal · 5.0mm · 0.86mm/px · 5 of 80 slices shown (2 of 3)]
[im 1/80]
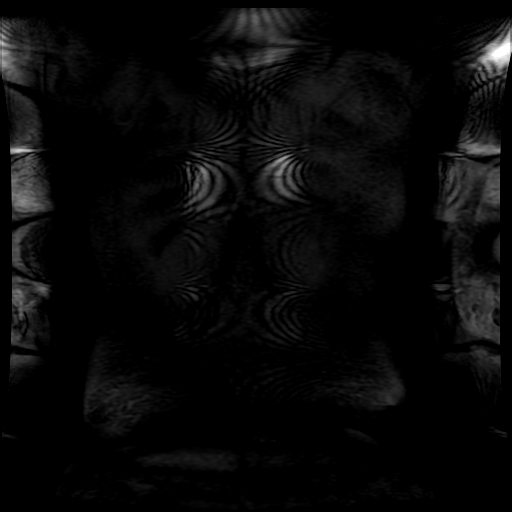
[im 20/80]
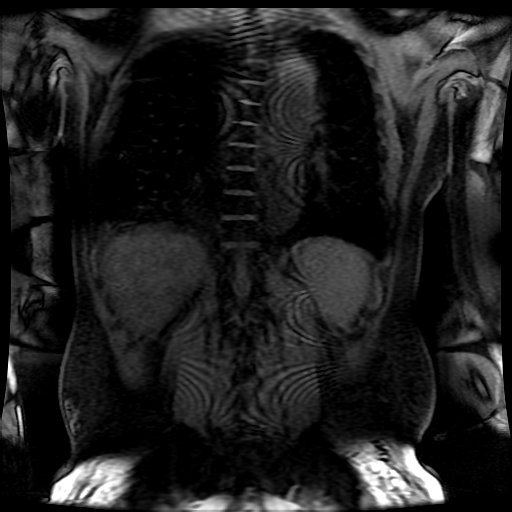
[im 40/80]
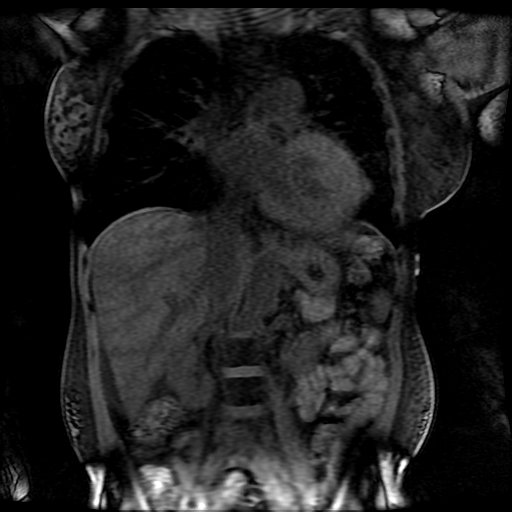
[im 60/80]
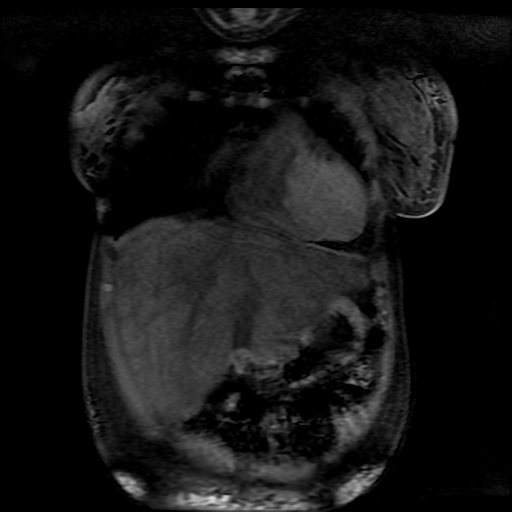
[im 80/80]
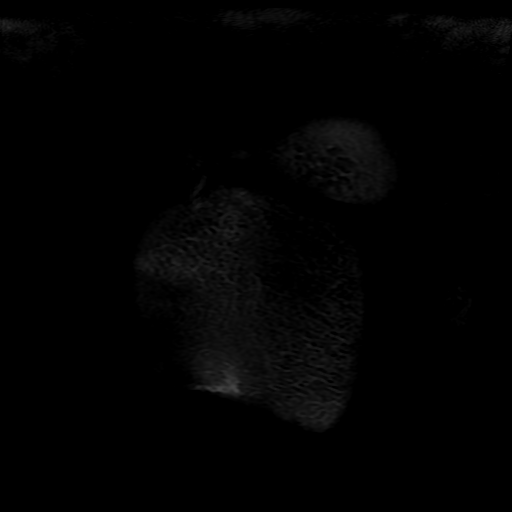

[Series 10: T1 dynamic · sagittal · non-contrast · 3.4mm · 0.80mm/px · 2 of 48 slices shown (3 of 3)]
[im 1/48]
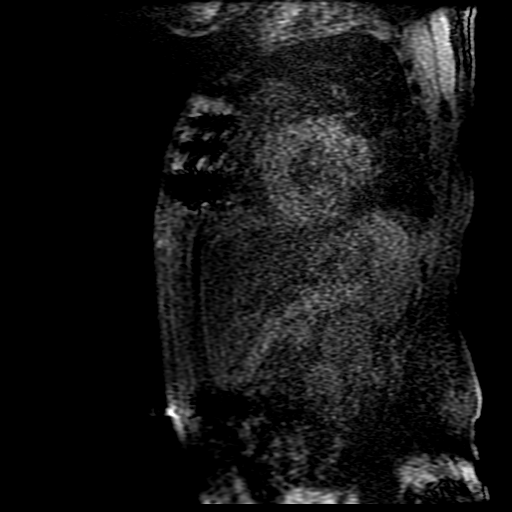
[im 24/48]
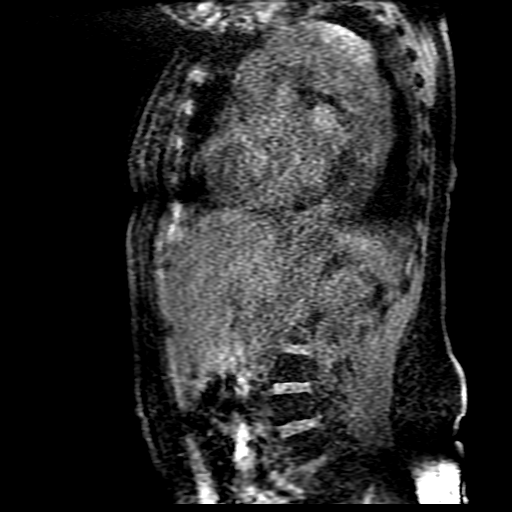

[28 of 48 positions shown; findings below may reference images not displayed]

FINDINGS: VASCULAR

Aorta: Stable appearance of the thoracic aorta with prior repair of
the ascending thoracic aorta. Residual chronic dissection which
begins in the distal arch has a stable appearance. Maximum of the
distal arch and proximal descending thoracic aorta is 4.3 cm and is
stable. Intimal flap extends again to the level of the mid
descending thoracic aorta. No evidence of significant enlargement of
the false lumen or acute hemorrhage.

Heart: Stable cardiac enlargement and evidence of left ventricular
hypertrophy. Trace pericardial fluid.

Pulmonary Arteries:  Normal caliber central pulmonary arteries.

Other: The abdominal aorta and its branches remain unremarkable
without evidence of aneurysm or dissection.

NON-VASCULAR

There is some interval enlargement in size of a right pleural
effusion which remains still of fairly small volume. There is a very
small left pleural effusion. Small amount of ascites in the
peritoneal cavity shows similar appearance to the prior study.
IMPRESSION: VASCULAR

Stable appearance of residual chronic dissection involving the
distal aortic arch and descending thoracic aorta. No significant
increased caliber of the descending aorta which measures 4.3 cm in
maximum diameter. No evidence of acute hemorrhage. Dissection again
terminates at the level of the mid descending thoracic aorta and
does not extend into the abdomen.

NON-VASCULAR

Some interval enlargement in size of a right pleural effusion. There
is a small left pleural effusion. Small amount of ascites in the
peritoneal cavity shows similar appearance to the prior study.

## 2022-01-15 DEATH — deceased
